# Patient Record
Sex: Male | Born: 1950 | Race: White | Hispanic: No | Marital: Married | Smoking: Former smoker
Health system: Southern US, Community
[De-identification: ages and names within clinical notes are randomized; demographics above are authoritative.]

## PROBLEM LIST (undated history)

## (undated) DIAGNOSIS — I77810 Thoracic aortic ectasia: Secondary | ICD-10-CM

## (undated) DIAGNOSIS — K649 Unspecified hemorrhoids: Secondary | ICD-10-CM

## (undated) DIAGNOSIS — I10 Essential (primary) hypertension: Secondary | ICD-10-CM

## (undated) DIAGNOSIS — K219 Gastro-esophageal reflux disease without esophagitis: Secondary | ICD-10-CM

## (undated) DIAGNOSIS — G4730 Sleep apnea, unspecified: Secondary | ICD-10-CM

## (undated) DIAGNOSIS — N183 Chronic kidney disease, stage 3 unspecified: Secondary | ICD-10-CM

## (undated) DIAGNOSIS — M1990 Unspecified osteoarthritis, unspecified site: Secondary | ICD-10-CM

## (undated) DIAGNOSIS — I499 Cardiac arrhythmia, unspecified: Secondary | ICD-10-CM

## (undated) DIAGNOSIS — E119 Type 2 diabetes mellitus without complications: Secondary | ICD-10-CM

## (undated) DIAGNOSIS — R001 Bradycardia, unspecified: Secondary | ICD-10-CM

## (undated) DIAGNOSIS — N529 Male erectile dysfunction, unspecified: Secondary | ICD-10-CM

## (undated) DIAGNOSIS — R519 Headache, unspecified: Secondary | ICD-10-CM

## (undated) DIAGNOSIS — I5189 Other ill-defined heart diseases: Secondary | ICD-10-CM

## (undated) DIAGNOSIS — N189 Chronic kidney disease, unspecified: Secondary | ICD-10-CM

## (undated) DIAGNOSIS — Z972 Presence of dental prosthetic device (complete) (partial): Secondary | ICD-10-CM

## (undated) DIAGNOSIS — I639 Cerebral infarction, unspecified: Secondary | ICD-10-CM

## (undated) DIAGNOSIS — K5790 Diverticulosis of intestine, part unspecified, without perforation or abscess without bleeding: Secondary | ICD-10-CM

## (undated) DIAGNOSIS — Z7901 Long term (current) use of anticoagulants: Secondary | ICD-10-CM

## (undated) DIAGNOSIS — E785 Hyperlipidemia, unspecified: Secondary | ICD-10-CM

## (undated) DIAGNOSIS — I4891 Unspecified atrial fibrillation: Secondary | ICD-10-CM

---

## 2014-10-18 ENCOUNTER — Encounter: Payer: Self-pay | Admitting: *Deleted

## 2014-10-21 ENCOUNTER — Encounter
Admission: RE | Disposition: A | Discharge status: Home / Self Care | Payer: Self-pay | Source: Ambulatory Visit | Attending: Gastroenterology

## 2014-10-21 ENCOUNTER — Ambulatory Visit
Admission: RE | Admit: 2014-10-21 | Discharge: 2014-10-21 | Disposition: A | Discharge status: Home / Self Care | Payer: Medicare Other | Source: Ambulatory Visit | Attending: Gastroenterology | Admitting: Gastroenterology

## 2014-10-21 ENCOUNTER — Encounter: Payer: Self-pay | Admitting: *Deleted

## 2014-10-21 ENCOUNTER — Ambulatory Visit: Payer: Medicare Other | Admitting: Anesthesiology

## 2014-10-21 DIAGNOSIS — E785 Hyperlipidemia, unspecified: Secondary | ICD-10-CM | POA: Insufficient documentation

## 2014-10-21 DIAGNOSIS — I10 Essential (primary) hypertension: Secondary | ICD-10-CM | POA: Insufficient documentation

## 2014-10-21 DIAGNOSIS — Z1211 Encounter for screening for malignant neoplasm of colon: Secondary | ICD-10-CM | POA: Diagnosis not present

## 2014-10-21 DIAGNOSIS — Z87891 Personal history of nicotine dependence: Secondary | ICD-10-CM | POA: Diagnosis not present

## 2014-10-21 DIAGNOSIS — M1990 Unspecified osteoarthritis, unspecified site: Secondary | ICD-10-CM | POA: Diagnosis not present

## 2014-10-21 DIAGNOSIS — E119 Type 2 diabetes mellitus without complications: Secondary | ICD-10-CM | POA: Insufficient documentation

## 2014-10-21 DIAGNOSIS — Z79899 Other long term (current) drug therapy: Secondary | ICD-10-CM | POA: Insufficient documentation

## 2014-10-21 DIAGNOSIS — K5730 Diverticulosis of large intestine without perforation or abscess without bleeding: Secondary | ICD-10-CM | POA: Insufficient documentation

## 2014-10-21 DIAGNOSIS — K648 Other hemorrhoids: Secondary | ICD-10-CM | POA: Diagnosis not present

## 2014-10-21 HISTORY — DX: Unspecified osteoarthritis, unspecified site: M19.90

## 2014-10-21 HISTORY — DX: Essential (primary) hypertension: I10

## 2014-10-21 HISTORY — PX: COLONOSCOPY WITH PROPOFOL: SHX5780

## 2014-10-21 HISTORY — DX: Type 2 diabetes mellitus without complications: E11.9

## 2014-10-21 HISTORY — DX: Hyperlipidemia, unspecified: E78.5

## 2014-10-21 HISTORY — DX: Unspecified hemorrhoids: K64.9

## 2014-10-21 LAB — GLUCOSE, CAPILLARY: Glucose-Capillary: 156 mg/dL — ABNORMAL HIGH (ref 65–99)

## 2014-10-21 SURGERY — COLONOSCOPY WITH PROPOFOL
Anesthesia: General

## 2014-10-21 MED ORDER — PROPOFOL 10 MG/ML IV BOLUS
INTRAVENOUS | Status: DC | PRN
  Administered 2014-10-21: 70 mg via INTRAVENOUS
  Administered 2014-10-21: 30 mg via INTRAVENOUS

## 2014-10-21 MED ORDER — PROPOFOL INFUSION 10 MG/ML OPTIME
INTRAVENOUS | Status: DC | PRN
  Administered 2014-10-21: 120 ug/kg/min via INTRAVENOUS

## 2014-10-21 MED ORDER — MIDAZOLAM HCL 5 MG/5ML IJ SOLN
INTRAMUSCULAR | Status: DC | PRN
  Administered 2014-10-21: 1 mg via INTRAVENOUS

## 2014-10-21 MED ORDER — SODIUM CHLORIDE 0.9 % IV SOLN
INTRAVENOUS | Status: DC
  Administered 2014-10-21: 09:00:00 via INTRAVENOUS

## 2014-10-21 MED ORDER — ONDANSETRON HCL 4 MG/2ML IJ SOLN
INTRAMUSCULAR | Status: DC | PRN
  Administered 2014-10-21: 4 mg via INTRAVENOUS

## 2014-10-21 MED ORDER — FENTANYL CITRATE (PF) 100 MCG/2ML IJ SOLN
INTRAMUSCULAR | Status: DC | PRN
  Administered 2014-10-21: 50 ug via INTRAVENOUS

## 2014-10-21 MED ORDER — LIDOCAINE HCL (CARDIAC) 20 MG/ML IV SOLN
INTRAVENOUS | Status: DC | PRN
  Administered 2014-10-21: 50 mg via INTRAVENOUS

## 2014-10-21 NOTE — Anesthesia Preprocedure Evaluation (Signed)
Anesthesia Evaluation  Patient identified by MRN, date of birth, ID band Patient awake    Reviewed: Allergy & Precautions, NPO status , Patient's Chart, lab work & pertinent test results, reviewed documented beta blocker date and time   Airway Mallampati: III  TM Distance: >3 FB Neck ROM: Full    Dental  (+) [place] lower left:   Pulmonary former smoker,  breath sounds clear to auscultation  Pulmonary exam normal       Cardiovascular hypertension, Normal cardiovascular exam    Neuro/Psych negative neurological ROS  negative psych ROS   GI/Hepatic negative GI ROS, Neg liver ROS,   Endo/Other  diabetes, Well Controlled, Type 2, Oral Hypoglycemic Agents  Renal/GU negative Renal ROS  negative genitourinary   Musculoskeletal  (+) Arthritis -, Osteoarthritis,    Abdominal Normal abdominal exam  (+)   Peds negative pediatric ROS (+)  Hematology negative hematology ROS (+)   Anesthesia Other Findings   Reproductive/Obstetrics                             Anesthesia Physical Anesthesia Plan  ASA: II  Anesthesia Plan: General   Post-op Pain Management:    Induction: Intravenous  Airway Management Planned: Nasal Cannula  Additional Equipment:   Intra-op Plan:   Post-operative Plan:   Informed Consent: I have reviewed the patients History and Physical, chart, labs and discussed the procedure including the risks, benefits and alternatives for the proposed anesthesia with the patient or authorized representative who has indicated his/her understanding and acceptance.   Dental advisory given  Plan Discussed with: CRNA and Surgeon  Anesthesia Plan Comments:         Anesthesia Quick Evaluation

## 2014-10-21 NOTE — Anesthesia Postprocedure Evaluation (Signed)
  Anesthesia Post-op Note  Patient: George Schmidt  Procedure(s) Performed: Procedure(s): COLONOSCOPY WITH PROPOFOL (N/A)  Anesthesia type:General  Patient location: PACU  Post pain: Pain level controlled  Post assessment: Post-op Vital signs reviewed, Patient's Cardiovascular Status Stable, Respiratory Function Stable, Patent Airway and No signs of Nausea or vomiting  Post vital signs: Reviewed and stable  Last Vitals:  Filed Vitals:   10/21/14 1030  BP:   Pulse: 53  Temp:   Resp: 14    Level of consciousness: awake, alert  and patient cooperative  Complications: No apparent anesthesia complications

## 2014-10-21 NOTE — H&P (Signed)
  Primary Care Physician:  [name], MD  Pre-Procedure History & Physical: HPI:  George Schmidt is a 64 y.o. male is here for an colonoscopy.   Past Medical History  Diagnosis Date  . Hypertension   . Diabetes mellitus without complication   . Arthritis   . Hemorrhoids   . Hyperlipidemia     History reviewed. No pertinent past surgical history.  Prior to Admission medications   Medication Sig Start Date End Date Taking? Authorizing Provider  amLODipine (NORVASC) 10 MG tablet Take 10 mg by mouth daily.   Yes Historical Provider, MD  atenolol-chlorthalidone (TENORETIC) 50-25 MG per tablet Take 1 tablet by mouth daily.   Yes Historical Provider, MD  fenofibrate 160 MG tablet Take 160 mg by mouth daily.   Yes Historical Provider, MD  metFORMIN (GLUCOPHAGE) 500 MG tablet Take by mouth 2 (two) times daily with a meal.   Yes Historical Provider, MD  potassium chloride SA (K-DUR,KLOR-CON) 20 MEQ tablet Take 20 mEq by mouth 2 (two) times daily.   Yes Historical Provider, MD  pravastatin (PRAVACHOL) 20 MG tablet Take 20 mg by mouth daily.   Yes Historical Provider, MD  tadalafil (CIALIS) 10 MG tablet Take 10 mg by mouth daily as needed for erectile dysfunction.   Yes Historical Provider, MD    Allergies as of 09/20/2014  . (Not on File)    History reviewed. No pertinent family history.  History   Social History  . Marital Status: Unknown    Spouse Name: N/A  . Number of Children: N/A  . Years of Education: N/A   Occupational History  . Not on file.   Social History Main Topics  . Smoking status: Former Research scientist (life sciences)  . Smokeless tobacco: Never Used  . Alcohol Use: 0.6 oz/week    1 Cans of beer per week  . Drug Use: No  . Sexual Activity: Not on file   Other Topics Concern  . Not on file   Social History Narrative     Physical Exam: BP 148/81 mmHg  Pulse 64  Temp(Src) 97 F (36.1 C) (Tympanic)  Resp 16  Ht 5\' 9"  (1.753 m)  Wt 265 lb (120.203 kg)  BMI 39.12  kg/m2  SpO2 98% General:   Alert,  pleasant and cooperative in NAD Head:  Normocephalic and atraumatic. Neck:  Supple; no masses or thyromegaly. Lungs:  Clear throughout to auscultation.    Heart:  Regular rate and rhythm. Abdomen:  Soft, nontender and nondistended. Normal bowel sounds, without guarding, and without rebound.   Neurologic:  Alert and  oriented x4;  grossly normal neurologically.  Impression/Plan: Kahil Agner is here for an colonoscopy to be performed for screening, average risk  Risks, benefits, limitations, and alternatives regarding  colonoscopy have been reviewed with the patient.  Questions have been answered.  All parties agreeable.   Josefine Class, MD  10/21/2014, 9:09 AM

## 2014-10-21 NOTE — Transfer of Care (Signed)
Immediate Anesthesia Transfer of Care Note  Patient: George Schmidt  Procedure(s) Performed: Procedure(s): COLONOSCOPY WITH PROPOFOL (N/A)  Patient Location: Endoscopy Unit  Anesthesia Type:General  Level of Consciousness: awake  Airway & Oxygen Therapy: Patient Spontanous Breathing  Post-op Assessment: Report given to RN  Post vital signs: Reviewed  Last Vitals:  Filed Vitals:   10/21/14 0941  BP: 114/74  Pulse: 65  Temp: 35.9 C  Resp: 14    Complications: No apparent anesthesia complications

## 2014-10-21 NOTE — Discharge Instructions (Signed)

## 2014-10-21 NOTE — Op Note (Signed)
[facility] Gastroenterology Patient Name: George Schmidt Procedure Date: 10/21/2014 9:05 AM MRN: [id number] Account #: [id number] Date of Birth: 18-Jan-1951 Admit Type: Outpatient Age: 64 Room: [facility] ENDO ROOM 2 Gender: Male Note Status: Finalized Procedure:         Colonoscopy Indications:       Screening for colorectal malignant neoplasm, This is the                     patient's first colonoscopy Patient Profile:   This is a 64 year old male. Providers:         Gerrit Heck. Rayann Heman, MD Referring MD:      Irven Easterly. Kary Kos, MD (Referring MD) Medicines:         Propofol per Anesthesia Complications:     No immediate complications. Procedure:         Pre-Anesthesia Assessment:                    - Prior to the procedure, a History and Physical was                     performed, and patient medications and allergies were                     reviewed. The patient is competent. The risks and benefits                     of the procedure and the sedation options and risks were                     discussed with the patient. All questions were answered                     and informed consent was obtained. Patient identification                     and proposed procedure were verified by the physician and                     the nurse in the pre-procedure area. Mental Status                     Examination: alert and oriented. Airway Examination:                     normal oropharyngeal airway and neck mobility. Respiratory                     Examination: clear to auscultation. CV Examination: RRR,                     no murmurs, no S3 or S4. Prophylactic Antibiotics: The                     patient does not require prophylactic antibiotics. Prior                     Anticoagulants: The patient has taken aspirin, last dose                     was 1 day prior to procedure. ASA Grade Assessment: II - A                     patient with mild  systemic disease. After reviewing  the                     risks and benefits, the patient was deemed in satisfactory                     condition to undergo the procedure. The anesthesia plan                     was to use monitored anesthesia care (MAC). Immediately                     prior to administration of medications, the patient was                     re-assessed for adequacy to receive sedatives. The heart                     rate, respiratory rate, oxygen saturations, blood                     pressure, adequacy of pulmonary ventilation, and response                     to care were monitored throughout the procedure. The                     physical status of the patient was re-assessed after the                     procedure.                    - Prior to the procedure, a History and Physical was                     performed, and patient medications, allergies and                     sensitivities were reviewed. The patient's tolerance of                     previous anesthesia was reviewed.                    After obtaining informed consent, the colonoscope was                     passed under direct vision. Throughout the procedure, the                     patient's blood pressure, pulse, and oxygen saturations                     were monitored continuously. The Colonoscope was                     introduced through the anus and advanced to the the cecum,                     identified by appendiceal orifice and ileocecal valve. The                     colonoscopy was performed without difficulty. The patient                     tolerated the procedure well.  The quality of the bowel                     preparation was good. Findings:      The perianal and digital rectal examinations were normal.      A few small and large-mouthed diverticula were found in the sigmoid       colon.      Internal hemorrhoids were found during retroflexion. The hemorrhoids       were Grade I (internal hemorrhoids that do  not prolapse).      The exam was otherwise without abnormality. Impression:        - Diverticulosis in the sigmoid colon.                    - Internal hemorrhoids.                    - The examination was otherwise normal.                    - No specimens collected. Recommendation:    - Observe patient in GI recovery unit.                    - High fiber diet.                    - Continue present medications.                    - Repeat colonoscopy in 10 years for screening purposes.                    - Return to referring physician.                    - The findings and recommendations were discussed with the                     patient.                    - The findings and recommendations were discussed with the                     patient's family. Procedure Code(s): --- Professional ---                    [phone], Colonoscopy, flexible; diagnostic, including                     collection of specimen(s) by brushing or washing, when                     performed (separate procedure) CPT copyright 2014 American Medical Association. All rights reserved. The codes documented in this report are preliminary and upon coder review may  be revised to meet current compliance requirements. Mellody Life, MD 10/21/2014 9:39:33 AM This report has been signed electronically. Number of Addenda: 0 Note Initiated On: 10/21/2014 9:05 AM Scope Withdrawal Time: 0 hours 9 minutes 55 seconds  Total Procedure Duration: 0 hours 18 minutes 3 seconds       [facility]

## 2014-10-26 ENCOUNTER — Encounter: Payer: Self-pay | Admitting: Gastroenterology

## 2019-08-25 ENCOUNTER — Ambulatory Visit: Payer: Medicare Other | Admitting: Dermatology

## 2019-08-25 ENCOUNTER — Other Ambulatory Visit: Payer: Self-pay

## 2019-08-25 DIAGNOSIS — L570 Actinic keratosis: Secondary | ICD-10-CM

## 2019-08-25 DIAGNOSIS — D485 Neoplasm of uncertain behavior of skin: Secondary | ICD-10-CM | POA: Diagnosis not present

## 2019-08-25 DIAGNOSIS — L219 Seborrheic dermatitis, unspecified: Secondary | ICD-10-CM | POA: Diagnosis not present

## 2019-08-25 DIAGNOSIS — C44619 Basal cell carcinoma of skin of left upper limb, including shoulder: Secondary | ICD-10-CM

## 2019-08-25 DIAGNOSIS — L578 Other skin changes due to chronic exposure to nonionizing radiation: Secondary | ICD-10-CM

## 2019-08-25 DIAGNOSIS — C4491 Basal cell carcinoma of skin, unspecified: Secondary | ICD-10-CM

## 2019-08-25 HISTORY — DX: Basal cell carcinoma of skin, unspecified: C44.91

## 2019-08-25 NOTE — Progress Notes (Signed)
Follow-Up Visit   Subjective  George Schmidt is a 69 y.o. male who presents for the following: Follow-up (AK x 7 treated with LN2 3 months ago. ).  Patient noticed a new spot on each forearm. He is not sure how long they've been there. No symptoms with either one and he has not done any treatment for them.   The following portions of the chart were reviewed this encounter and updated as appropriate: Tobacco  Allergies  Meds  Problems  Med Hx  Surg Hx  Fam Hx      Review of Systems: No other skin or systemic complaints.  Objective  Well appearing patient in no apparent distress; mood and affect are within normal limits.  A focused examination was performed including face, ears, arms, shoulders, scalp. Relevant physical exam findings are noted in the Assessment and Plan.  Objective  Right Forearm x 1, R nasal sidewall x 1, R temple x 6, R frontal hairline x 1 (9): Erythematous thin papules/macules with gritty scale.   Objective  Right Forearm: 0.6cm scaly pink think papule       Objective  Left anterior shoulder medial: 2.2 x 1.3cm thin pink plaque        Objective  Left anterior shoulder lateral: 1.2 x 1.0cm thin pink plaque       Objective  Scalp: Pink patches with greasy scale.   Assessment & Plan  AK (actinic keratosis) (9) Right Forearm x 1, R nasal sidewall x 1, R temple x 6, R frontal hairline x 1  Destruction of lesion - Right Forearm x 1, R nasal sidewall x 1, R temple x 6, R frontal hairline x 1 Complexity: simple   Destruction method: cryotherapy   Informed consent: discussed and consent obtained   Lesion destroyed using liquid nitrogen: Yes   Region frozen until ice ball extended beyond lesion: Yes   Outcome: patient tolerated procedure well with no complications   Post-procedure details: wound care instructions given    Neoplasm of uncertain behavior of skin (3) Right Forearm  Skin / nail biopsy Type of biopsy: tangential    Informed consent: discussed and consent obtained   Timeout: patient name, date of birth, surgical site, and procedure verified   Patient was prepped and draped in usual sterile fashion: Area prepped with isopropyl alcohol. Anesthesia: the lesion was anesthetized in a standard fashion   Anesthetic:  1% lidocaine w/ epinephrine 1-100,000 buffered w/ 8.4% NaHCO3 Instrument used: flexible razor blade   Hemostasis achieved with: aluminum chloride   Outcome: patient tolerated procedure well   Post-procedure details: wound care instructions given   Additional details:  Mupirocin and a bandage applied  Specimen 1 - Surgical pathology Differential Diagnosis: r/o SCC vs SK vs Prurigo Nodule Check Margins: No 0.6cm scaly pink think papule  Left anterior shoulder medial  Skin / nail biopsy Type of biopsy: tangential   Informed consent: discussed and consent obtained   Timeout: patient name, date of birth, surgical site, and procedure verified   Patient was prepped and draped in usual sterile fashion: Area prepped with isopropyl alcohol. Anesthesia: the lesion was anesthetized in a standard fashion   Anesthetic:  1% lidocaine w/ epinephrine 1-100,000 buffered w/ 8.4% NaHCO3 Instrument used: flexible razor blade   Hemostasis achieved with: aluminum chloride   Outcome: patient tolerated procedure well   Post-procedure details: wound care instructions given   Additional details:  Mupirocin and a bandage applied  Specimen 2 - Surgical pathology Differential Diagnosis:  R/o BCC vs Scar vs Other Check Margins: No 2.2 x 1.3cm thin pink plaque  Left anterior shoulder lateral  Skin / nail biopsy Type of biopsy: tangential   Informed consent: discussed and consent obtained   Timeout: patient name, date of birth, surgical site, and procedure verified   Patient was prepped and draped in usual sterile fashion: Area prepped with isopropyl alcohol. Anesthesia: the lesion was anesthetized in a standard  fashion   Anesthetic:  1% lidocaine w/ epinephrine 1-100,000 buffered w/ 8.4% NaHCO3 Instrument used: flexible razor blade   Hemostasis achieved with: aluminum chloride   Outcome: patient tolerated procedure well   Post-procedure details: wound care instructions given   Additional details:  Mupirocin and a bandage applied  Specimen 3 - Surgical pathology Differential Diagnosis: R/o BCC vs Scar vs Other Check Margins: No 1.2 x 1.0cm thin pink plaque  Seborrheic dermatitis Scalp  Pt defers treatment today. Using Head & Shoulders. Advised him he can continue but to leave on for 10 minutes before washing out.   Actinic Damage - diffuse scaly erythematous macules with underlying dyspigmentation - Recommend daily broad spectrum sunscreen SPF 30+ to sun-exposed areas, reapply every 2 hours as needed.  - Call for new or changing lesions.  Return in about 3 months (around 11/24/2019) for [place], RMA, am acting as scribe for Forest Gleason, MD .  Documentation: I have reviewed the above documentation for accuracy and completeness, and I agree with the above.  Forest Gleason, MD

## 2019-08-25 NOTE — Patient Instructions (Signed)
Wound Care Instructions  1. Cleanse wound gently with soap and water once a day then pat dry with clean gauze. Apply a thing coat of Petrolatum (petroleum jelly, "Vaseline") over the wound (unless you have an allergy to this). We recommend that you use a new, sterile tube of Vaseline. Do not pick or remove scabs. Do not remove the yellow or white "healing tissue" from the base of the wound.  2. Cover the wound with fresh, clean, nonstick gauze and secure with paper tape. You may use Band-Aids in place of gauze and tape if the would is small enough, but would recommend trimming much of the tape off as there is often too much. Sometimes Band-Aids can irritate the skin.  3. You should call the office for your biopsy report after 1 week if you have not already been contacted.  4. If you experience any problems, such as abnormal amounts of bleeding, swelling, significant bruising, significant pain, or evidence of infection, please call the office immediately.  5. FOR ADULT SURGERY PATIENTS: If you need something for pain relief you may take 1 extra strength Tylenol (acetaminophen) AND 2 Ibuprofen (200mg each) together every 4 hours as needed for pain. (do not take these if you are allergic to them or if you have a reason you should not take them.) Typically, you may only need pain medication for 1 to 3 days.    Cryotherapy Aftercare  . Wash gently with soap and water everyday.   . Apply Vaseline and Band-Aid daily until healed.  Recommend daily broad spectrum sunscreen SPF 30+ to sun-exposed areas, reapply every 2 hours as needed. Call for new or changing lesions.  

## 2019-09-01 ENCOUNTER — Telehealth: Payer: Self-pay | Admitting: Dermatology

## 2019-09-01 NOTE — Telephone Encounter (Signed)
Spoke with patient regarding results. He is in agreement with treatment plans.  All questions answered.  Fredric Dine or Abby, can you please schedule Mr. George Schmidt for an [facility] and LN2 appointment (15 minute appointment) at the next available with me or one of the other doctors? Thank you!  1. 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 It is not dangerous but is contagious and can spread to other areas of skin or other people if it is not completely gone. No additional treatment is needed. However, if it comes back, we can freeze it in clinic with liquid nitrogen (a quick in office procedure) or you can also treat it at home with an over the counter salicylic wart treatment (slower).  2. Skin, left anterior shoulder medial ACTINIC KERATOSIS AND SEBORRHEIC KERATOSIS --> LN2 in clinic  3. Skin, left anterior shoulder lateral BASAL CELL CARCINOMA, SUPERFICIAL AND NODULAR PATTERNS  --> ED&C  Recommend Nicotinamide 500mg  twice per day to lower risk of non-melanoma skin cancer by approximately 25%.

## 2019-09-01 NOTE — Progress Notes (Signed)
1. 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 It is not dangerous but is contagious and can spread to other areas of skin or other people if it is not completely gone. No additional treatment is needed. However, if it comes back, we can freeze it in clinic with liquid nitrogen (a quick in office procedure) or you can also treat it at home with an over the counter salicylic wart treatment (slower).  2. Skin, left anterior shoulder medial ACTINIC KERATOSIS AND SEBORRHEIC KERATOSIS --> LN2 in clinic  3. Skin, left anterior shoulder lateral BASAL CELL CARCINOMA, SUPERFICIAL AND NODULAR PATTERNS  --> ED&C  Recommend Nicotinamide 500mg  twice per day to lower risk of non-melanoma skin cancer by approximately 25%.

## 2019-09-09 ENCOUNTER — Encounter: Payer: Self-pay | Admitting: Dermatology

## 2019-09-16 NOTE — Telephone Encounter (Signed)
Patient is scheduled   

## 2019-09-24 ENCOUNTER — Ambulatory Visit: Payer: Medicare Other | Admitting: Dermatology

## 2019-10-13 ENCOUNTER — Ambulatory Visit: Payer: Medicare Other | Admitting: Dermatology

## 2019-11-10 ENCOUNTER — Other Ambulatory Visit: Payer: Self-pay

## 2019-11-10 ENCOUNTER — Ambulatory Visit: Payer: Medicare Other | Admitting: Dermatology

## 2019-11-10 DIAGNOSIS — C44619 Basal cell carcinoma of skin of left upper limb, including shoulder: Secondary | ICD-10-CM

## 2019-11-10 DIAGNOSIS — L570 Actinic keratosis: Secondary | ICD-10-CM | POA: Diagnosis not present

## 2019-11-10 DIAGNOSIS — L578 Other skin changes due to chronic exposure to nonionizing radiation: Secondary | ICD-10-CM

## 2019-11-10 DIAGNOSIS — B079 Viral wart, unspecified: Secondary | ICD-10-CM

## 2019-11-10 NOTE — Progress Notes (Signed)
Follow-Up Visit   Subjective  George Schmidt is a 69 y.o. male who presents for the following: Procedure.  Patient presents today for ED& C for [place] on Left anterior lateral shoulder, and cryotherapy for AK on left anterior shoulder and for wart on R forearm  The following portions of the chart were reviewed this encounter and updated as appropriate:      Review of Systems:  No other skin or systemic complaints except as noted in HPI or Assessment and Plan.  Objective  Well appearing patient in no apparent distress; mood and affect are within normal limits.  A focused examination was performed including Left Shoulder. Relevant physical exam findings are noted in the Assessment and Plan.  Objective  Left Anterior Lateral Shoulder: Pink pearly plaque    Objective  Left Anterior Medial Shoulder: Erythematous patch   Objective  Right Forearm - Anterior: Verrucous papule -- Discussed viral etiology and contagion.    Assessment & Plan  Basal cell carcinoma (BCC) of skin of left upper extremity including shoulder Left Anterior Lateral Shoulder  Destruction of lesion  Destruction method: electrodesiccation and curettage   Informed consent: discussed and consent obtained   Timeout:  patient name, date of birth, surgical site, and procedure verified Patient was prepped and draped in usual sterile fashion: area prepped with Isopropyl Alcohol. Anesthesia: the lesion was anesthetized in a standard fashion   Anesthetic:  1% lidocaine w/ epinephrine 1-100,000 local infiltration Curettage performed in three different directions: Yes   Electrodesiccation performed over the curetted area: Yes   Lesion length (cm):  1.2 Lesion width (cm):  1 Margin per side (cm):  0.3 Final wound size (cm):  1.8 Hemostasis achieved with:  pressure, aluminum chloride and electrodesiccation Outcome: patient tolerated procedure well with no complications   Post-procedure details: wound care  instructions given   Post-procedure details comment:  Ointment and bandage applied  AK (actinic keratosis) Left Anterior Medial Shoulder  Biopsy proven AK/ISK Cryotherapy today Prior to procedure, discussed risks of blister formation, small wound, skin dyspigmentation, or rare scar following cryotherapy.     Destruction of lesion - Left Anterior Medial Shoulder  Destruction method: cryotherapy   Informed consent: discussed and consent obtained   Lesion destroyed using liquid nitrogen: Yes   Region frozen until ice ball extended beyond lesion: Yes   Outcome: patient tolerated procedure well with no complications   Post-procedure details: wound care instructions given    Viral warts, unspecified type Right Forearm - Anterior  Biopsy-proven wart.  Discussed viral etiology and risk of spread.  Discussed multiple treatments may be required to clear warts.  Discussed possible post-treatment dyspigmentation and risk of recurrence.  Cryotherapy today   Destruction of lesion - Right Forearm - Anterior  Destruction method: cryotherapy   Informed consent: discussed and consent obtained   Lesion destroyed using liquid nitrogen: Yes   Region frozen until ice ball extended beyond lesion: Yes   Outcome: patient tolerated procedure well with no complications   Post-procedure details: wound care instructions given   Additional details:  Prior to procedure, discussed risks of blister formation, small wound, skin dyspigmentation, or rare scar following cryotherapy.    Actinic Damage - diffuse scaly erythematous macules with underlying dyspigmentation - Recommend daily broad spectrum sunscreen SPF 30+ to sun-exposed areas, reapply every 2 hours as needed.  - Call for new or changing lesions.   Return in 4 months (on 03/11/2020) for UBSE with Dr. Laurence Ferrari, recheck [facility] site, AK, wart.  Marene Lenz, CMA, am acting as scribe for Brendolyn Patty, MD .  Documentation: I have reviewed the above  documentation for accuracy and completeness, and I agree with the above.  Brendolyn Patty MD

## 2019-11-10 NOTE — Patient Instructions (Signed)
Recommend daily broad spectrum sunscreen SPF 30+ to sun-exposed areas, reapply every 2 hours as needed. Call for new or changing lesions.  Electrodesiccation and Curettage ("Scrape and Burn") Wound Care Instructions  1. Leave the original bandage on for 24 hours if possible.  If the bandage becomes soaked or soiled before that time, it is OK to remove it and examine the wound.  A small amount of post-operative bleeding is normal.  If excessive bleeding occurs, remove the bandage, place gauze over the site and apply continuous pressure (no peeking) over the area for 30 minutes. If this does not work, please call our clinic as soon as possible or page your doctor if it is after hours.   2. Once a day, cleanse the wound with soap and water. It is fine to shower. If a thick crust develops you may use a Q-tip dipped into dilute hydrogen peroxide (mix 1:1 with water) to dissolve it.  Hydrogen peroxide can slow the healing process, so use it only as needed.    3. After washing, apply petroleum jelly (Vaseline) or an antibiotic ointment if your doctor prescribed one for you, followed by a bandage.    4. For best healing, the wound should be covered with a layer of ointment at all times. If you are not able to keep the area covered with a bandage to hold the ointment in place, this may mean re-applying the ointment several times a day.  Continue this wound care until the wound has healed and is no longer open. It may take several weeks for the wound to heal and close.  Itching and mild discomfort is normal during the healing process.  If you have any discomfort, you can take Tylenol (acetaminophen) or ibuprofen as directed on the bottle. (Please do not take these if you have an allergy to them or cannot take them for another reason).  Some redness, tenderness and white or yellow material in the wound is normal healing.  If the area becomes very sore and red, or develops a thick yellow-green material (pus), it  may be infected; please notify us.    Wound healing continues for up to one year following surgery. It is not unusual to experience pain in the scar from time to time during the interval.  If the pain becomes severe or the scar thickens, you should notify the office.    A slight amount of redness in a scar is expected for the first six months.  After six months, the redness will fade and the scar will soften and fade.  The color difference becomes less noticeable with time.  If there are any problems, return for a post-op surgery check at your earliest convenience.  To improve the appearance of the scar, you can use silicone scar gel, cream, or sheets (such as Mederma or Serica) every night for up to one year. These are available over the counter (without a prescription).  Please call our office at [phone] for any questions or concerns.  

## 2019-11-30 ENCOUNTER — Observation Stay: Payer: Medicare Other

## 2019-11-30 ENCOUNTER — Emergency Department: Payer: Medicare Other

## 2019-11-30 ENCOUNTER — Other Ambulatory Visit: Payer: Self-pay

## 2019-11-30 ENCOUNTER — Observation Stay
Admission: EM | Admit: 2019-11-30 | Discharge: 2019-12-01 | Disposition: A | Discharge status: Home / Self Care | Payer: Medicare Other | Attending: Internal Medicine | Admitting: Internal Medicine

## 2019-11-30 DIAGNOSIS — R270 Ataxia, unspecified: Secondary | ICD-10-CM

## 2019-11-30 DIAGNOSIS — E119 Type 2 diabetes mellitus without complications: Secondary | ICD-10-CM | POA: Diagnosis not present

## 2019-11-30 DIAGNOSIS — Z20822 Contact with and (suspected) exposure to covid-19: Secondary | ICD-10-CM | POA: Insufficient documentation

## 2019-11-30 DIAGNOSIS — I639 Cerebral infarction, unspecified: Secondary | ICD-10-CM | POA: Diagnosis present

## 2019-11-30 DIAGNOSIS — E785 Hyperlipidemia, unspecified: Secondary | ICD-10-CM | POA: Diagnosis not present

## 2019-11-30 DIAGNOSIS — R778 Other specified abnormalities of plasma proteins: Secondary | ICD-10-CM | POA: Insufficient documentation

## 2019-11-30 DIAGNOSIS — R531 Weakness: Secondary | ICD-10-CM | POA: Insufficient documentation

## 2019-11-30 DIAGNOSIS — I63542 Cerebral infarction due to unspecified occlusion or stenosis of left cerebellar artery: Secondary | ICD-10-CM

## 2019-11-30 DIAGNOSIS — R42 Dizziness and giddiness: Secondary | ICD-10-CM | POA: Diagnosis present

## 2019-11-30 DIAGNOSIS — E876 Hypokalemia: Secondary | ICD-10-CM | POA: Diagnosis not present

## 2019-11-30 DIAGNOSIS — I10 Essential (primary) hypertension: Secondary | ICD-10-CM | POA: Diagnosis present

## 2019-11-30 DIAGNOSIS — R7989 Other specified abnormal findings of blood chemistry: Secondary | ICD-10-CM | POA: Diagnosis present

## 2019-11-30 HISTORY — DX: Cerebral infarction due to unspecified occlusion or stenosis of left cerebellar artery: I63.542

## 2019-11-30 LAB — CBC WITH DIFFERENTIAL/PLATELET
Abs Immature Granulocytes: 0.06 10*3/uL (ref 0.00–0.07)
Basophils Absolute: 0 10*3/uL (ref 0.0–0.1)
Basophils Relative: 0 %
Eosinophils Absolute: 0 10*3/uL (ref 0.0–0.5)
Eosinophils Relative: 0 %
HCT: 38.3 % — ABNORMAL LOW (ref 39.0–52.0)
Hemoglobin: 13.3 g/dL (ref 13.0–17.0)
Immature Granulocytes: 1 %
Lymphocytes Relative: 9 %
Lymphs Abs: 0.9 10*3/uL (ref 0.7–4.0)
MCH: 32.4 pg (ref 26.0–34.0)
MCHC: 34.7 g/dL (ref 30.0–36.0)
MCV: 93.2 fL (ref 80.0–100.0)
Monocytes Absolute: 0.5 10*3/uL (ref 0.1–1.0)
Monocytes Relative: 4 %
Neutro Abs: 8.7 10*3/uL — ABNORMAL HIGH (ref 1.7–7.7)
Neutrophils Relative %: 86 %
Platelets: 203 10*3/uL (ref 150–400)
RBC: 4.11 MIL/uL — ABNORMAL LOW (ref 4.22–5.81)
RDW: 12.3 % (ref 11.5–15.5)
WBC: 10.1 10*3/uL (ref 4.0–10.5)
nRBC: 0 % (ref 0.0–0.2)

## 2019-11-30 LAB — COMPREHENSIVE METABOLIC PANEL
ALT: 20 U/L (ref 0–44)
AST: 20 U/L (ref 15–41)
Albumin: 3.4 g/dL — ABNORMAL LOW (ref 3.5–5.0)
Alkaline Phosphatase: 24 U/L — ABNORMAL LOW (ref 38–126)
Anion gap: 12 (ref 5–15)
BUN: 23 mg/dL (ref 8–23)
CO2: 17 mmol/L — ABNORMAL LOW (ref 22–32)
Calcium: 7.4 mg/dL — ABNORMAL LOW (ref 8.9–10.3)
Chloride: 113 mmol/L — ABNORMAL HIGH (ref 98–111)
Creatinine, Ser: 1.04 mg/dL (ref 0.61–1.24)
GFR calc Af Amer: >60 mL/min (ref >60)
GFR calc non Af Amer: >60 mL/min (ref >60)
Glucose, Bld: 161 mg/dL — ABNORMAL HIGH (ref 70–99)
Potassium: 2.5 mmol/L — CL (ref 3.5–5.1)
Sodium: 142 mmol/L (ref 135–145)
Total Bilirubin: 0.9 mg/dL (ref 0.3–1.2)
Total Protein: 6 g/dL — ABNORMAL LOW (ref 6.5–8.1)

## 2019-11-30 LAB — GLUCOSE, CAPILLARY
Glucose-Capillary: 144 mg/dL — ABNORMAL HIGH (ref 70–99)
Glucose-Capillary: 155 mg/dL — ABNORMAL HIGH (ref 70–99)

## 2019-11-30 LAB — TROPONIN I (HIGH SENSITIVITY)
Troponin I (High Sensitivity): 103 ng/L (ref <18)
Troponin I (High Sensitivity): 85 ng/L — ABNORMAL HIGH (ref <18)
Troponin I (High Sensitivity): 96 ng/L — ABNORMAL HIGH (ref <18)
Troponin I (High Sensitivity): 108 ng/L (ref <18)
Troponin I (High Sensitivity): 112 ng/L (ref <18)

## 2019-11-30 LAB — POTASSIUM: Potassium: 3.7 mmol/L (ref 3.5–5.1)

## 2019-11-30 LAB — SARS CORONAVIRUS 2 BY RT PCR (HOSPITAL ORDER, PERFORMED IN ~~LOC~~ HOSPITAL LAB): SARS Coronavirus 2: NEGATIVE

## 2019-11-30 IMAGING — CT CT ANGIO HEAD
2 of 7 series · 8 of 33 positions shown · IV contrast (APPLIED)
Comparison: None.

CLINICAL DATA: Cerebellar infarcts

EXAM:
CT ANGIOGRAPHY HEAD AND NECK
TECHNIQUE: Multidetector CT imaging of the head and neck was performed using
the standard protocol during bolus administration of intravenous
contrast. Multiplanar CT image reconstructions and MIPs were
obtained to evaluate the vascular anatomy. Carotid stenosis
measurements (when applicable) are obtained utilizing NASCET
criteria, using the distal internal carotid diameter as the
denominator.
CONTRAST:  75mL OMNIPAQUE IOHEXOL 350 MG/ML SOLN

[Series 4: cta head neck · axial · 0.59mm/px · z∈[-271,-153]mm · 2 of 179 slices shown]
[im 60/179  soft-tissue]
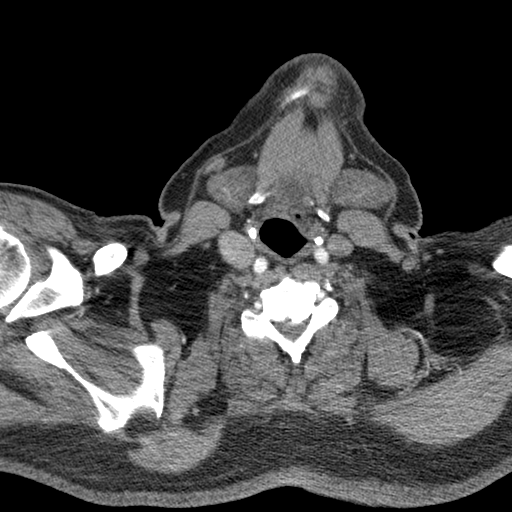
[im 119/179  soft-tissue]
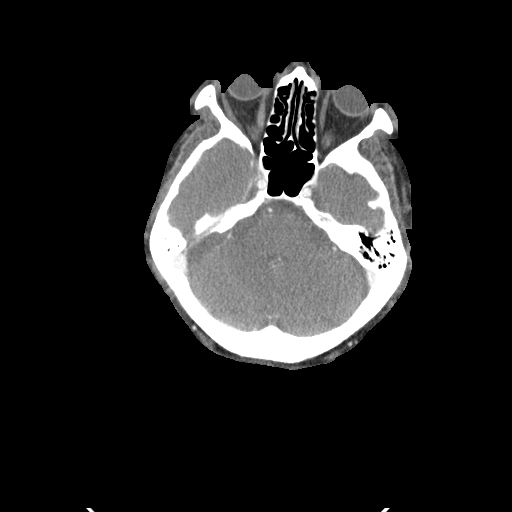

[Series 6: ax thin · axial · 0.50mm/px · z∈[-339,-84]mm · 6 of 357 slices shown]
[im 51/357  soft-tissue]
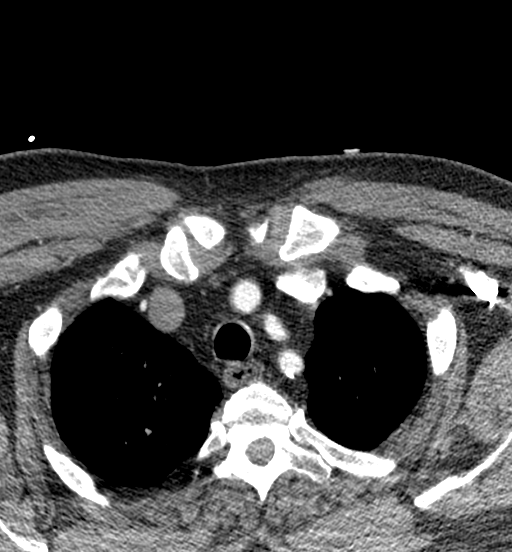
[im 102/357  bone]
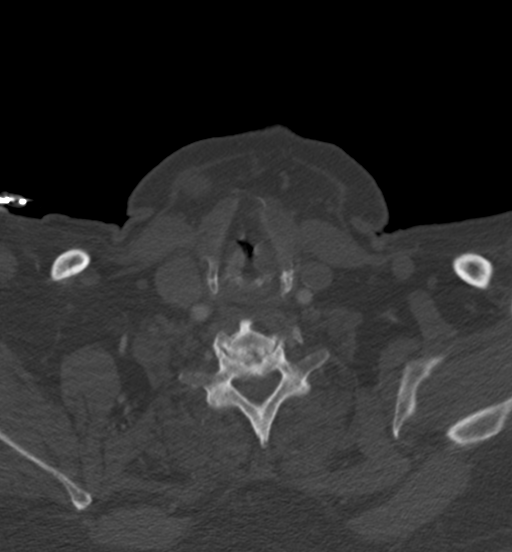
[im 153/357  soft-tissue]
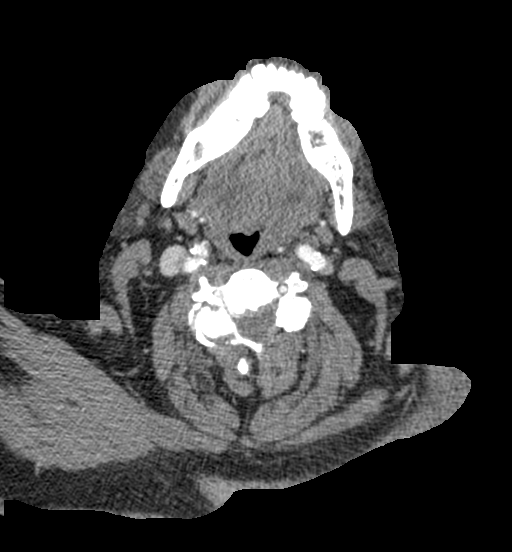
[im 204/357  bone]
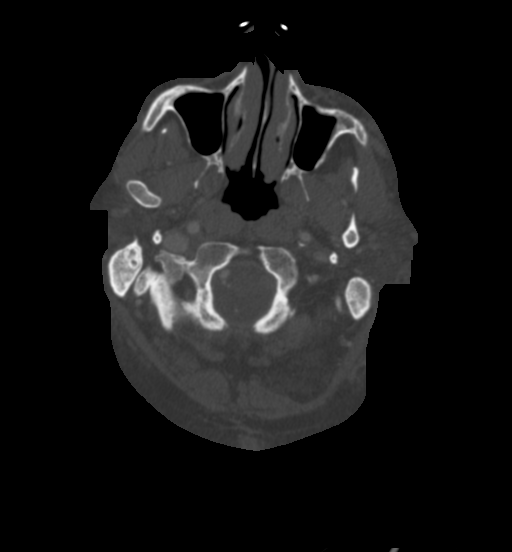
[im 255/357  soft-tissue]
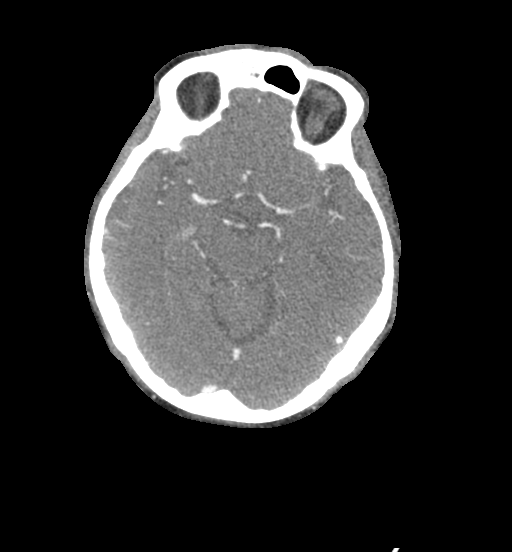
[im 306/357  bone]
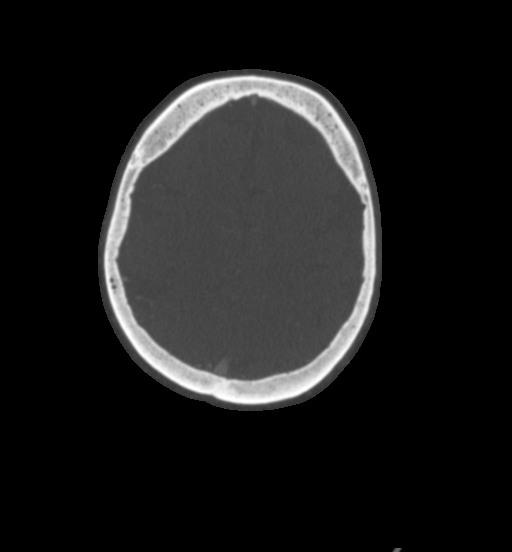

[8 of 33 positions shown; findings below may reference images not displayed]

FINDINGS: CTA NECK

Aortic arch: Great vessel origins are patent.

Right carotid system: Patent. Mild calcified plaque along the common
carotid. Mild calcified plaque at the ICA origin without measurable
stenosis.

Left carotid system: Patent. Mild calcified and noncalcified plaque
at the ICA origin causing minimal stenosis. Mild plaque along the
distal cervical ICA.

Vertebral arteries: Right vertebral artery is patent. Proximal left
vertebral arteries patent with calcified plaque at the origin likely
causing mild stenosis. There is loss of enhancement at the level of
the C2 transverse foramen without reconstitution in the neck.

Skeleton: Multilevel degenerative changes of the cervical spine.

Other neck: No mass or adenopathy.

Upper chest: No apical lung mass.

Review of the MIP images confirms the above findings

CTA HEAD

Anterior circulation: Intracranial internal carotid arteries are
patent with calcified plaque causing mild to moderate stenosis.
Anterior cerebral arteries are patent. Anterior communicating artery
is present. Middle cerebral arteries are patent.

Posterior circulation: Intracranial right vertebral artery and PICA
origins are patent. There is minimal reconstitution of the left
vertebral artery distally adjacent to the basilar artery origin.
Left PICA origin appears occluded. Basilar artery is patent with
moderate to marked stenosis with mid segment. Small caliber AICAs
are present. Superior cerebral artery origins are patent. Posterior
cerebral arteries are patent.

Venous sinuses: Patent as allowed by contrast bolus timing.

Review of the MIP images confirms the above findings
IMPRESSION: Occlusion of the left extracranial vertebral artery at the C2
transverse foramen with minimal reconstitution adjacent to the
basilar origin. Left PICA origin appears occluded.

Moderate to marked stenosis of the mid basilar artery.

No hemodynamically significant stenosis at the ICA origins.

## 2019-11-30 IMAGING — CT CT HEAD W/O CM
3 series · 15 of 47 positions shown, 18 images · non-contrast
Comparison: No pertinent prior studies available for comparison.

CLINICAL DATA: Ataxia, stroke suspected. Additional provided:
Weakness and dizziness, vomiting

EXAM:
CT HEAD WITHOUT CONTRAST
TECHNIQUE: Contiguous axial images were obtained from the base of the skull
through the vertex without intravenous contrast.

[Series 3: head wo · axial · 0.44mm/px · z∈[-169,-39]mm · 9 of 32 slices shown, 12 images]
[im 3/32  brain]
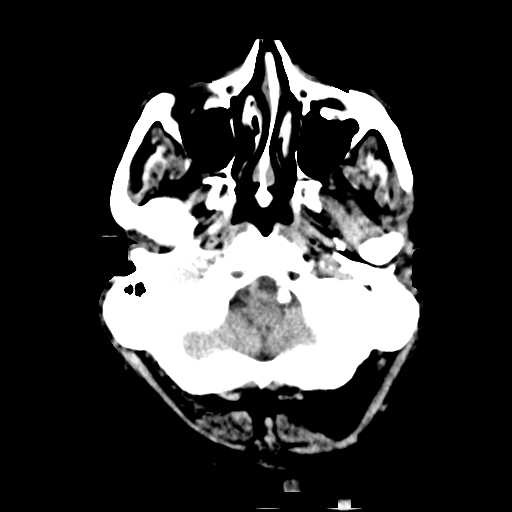
[im 3/32  bone]
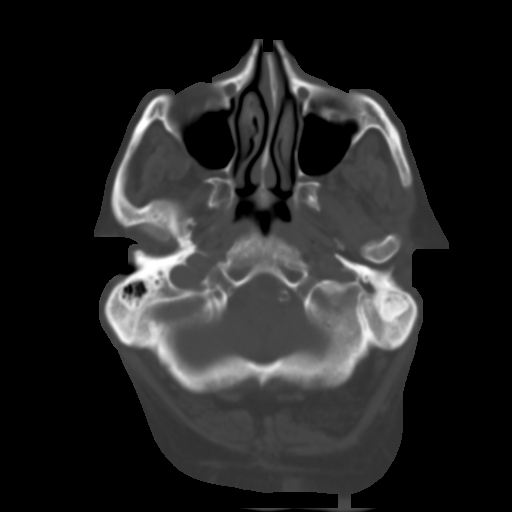
[im 6/32  brain]
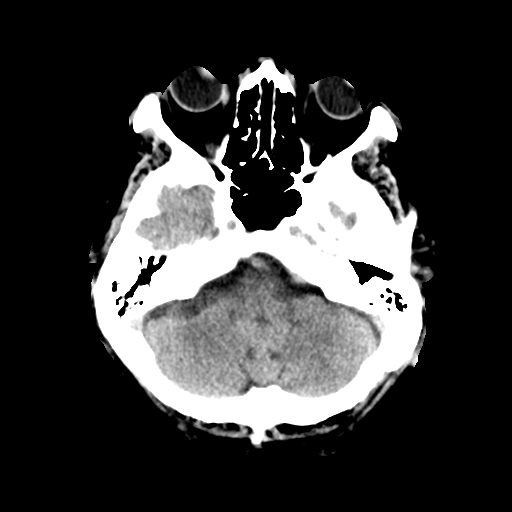
[im 9/32  brain]
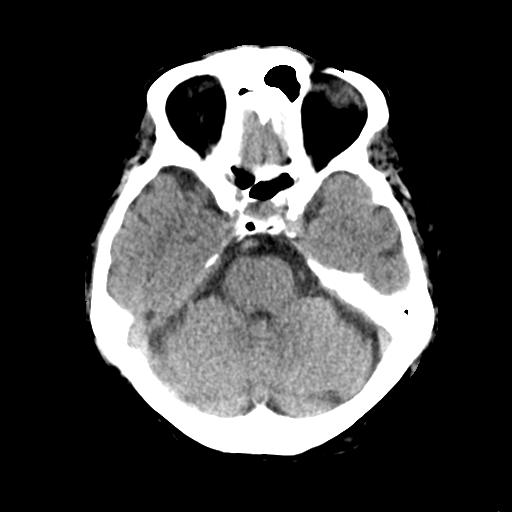
[im 12/32  brain]
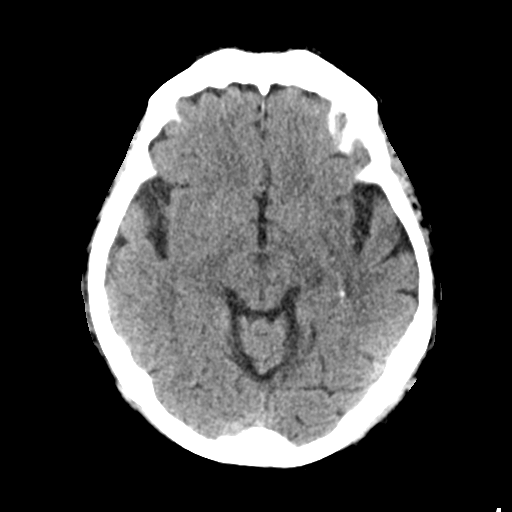
[im 17/32  brain]
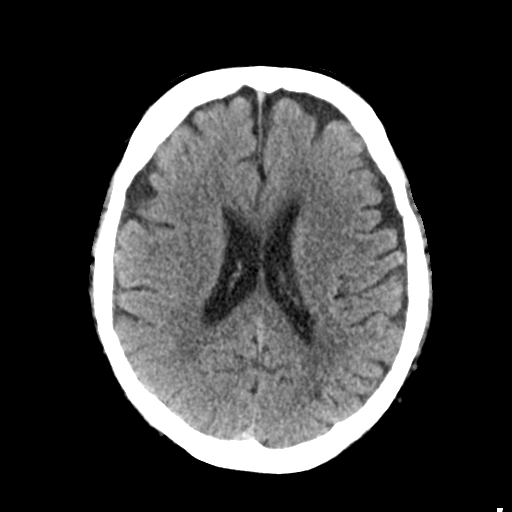
[im 17/32  bone]
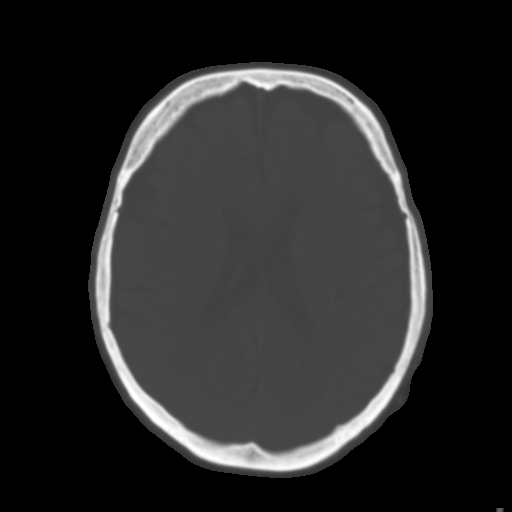
[im 20/32  brain]
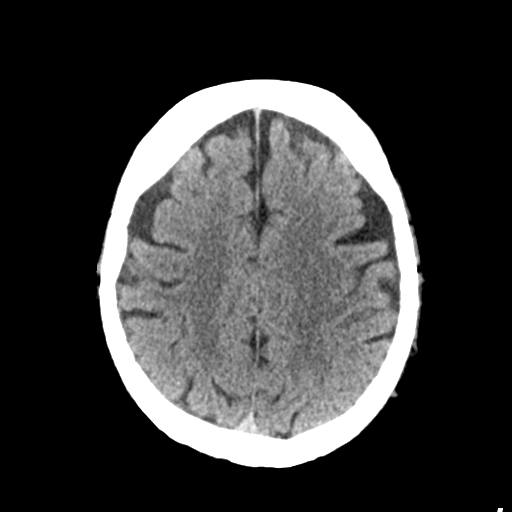
[im 23/32  brain]
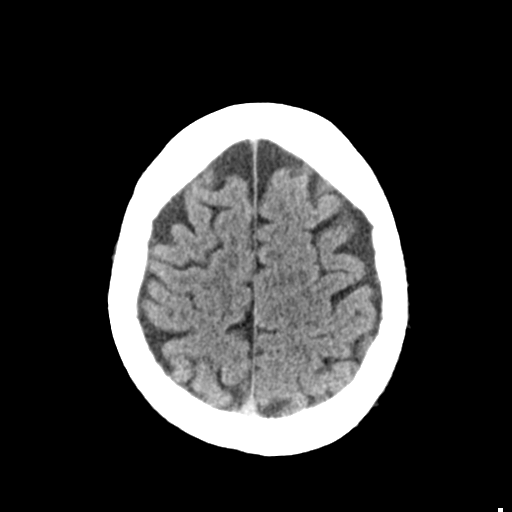
[im 26/32  brain]
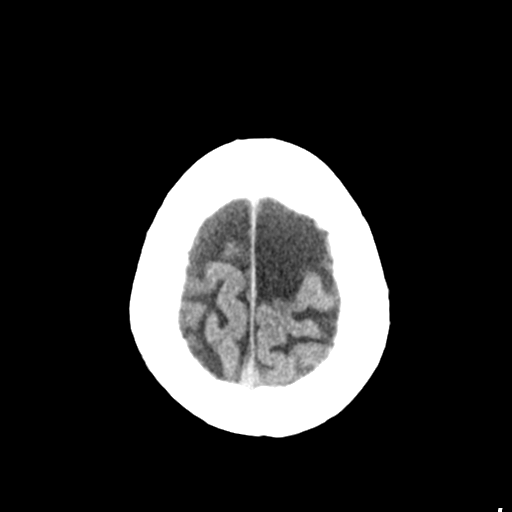
[im 29/32  brain]
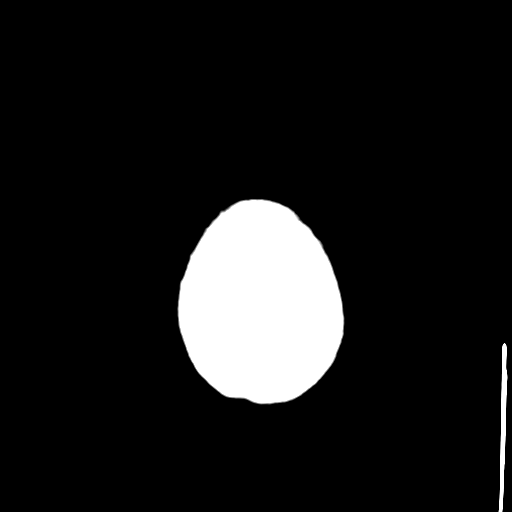
[im 29/32  bone]
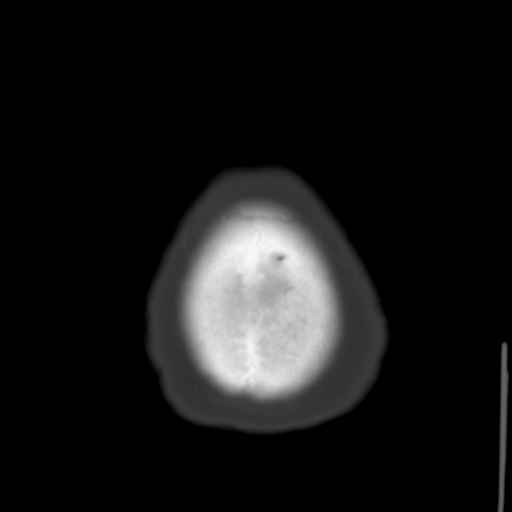

[Series 4: coronal soft tissue · coronal · 0.31mm/px · 3 of 65 slices shown]
[im 22/65  brain]
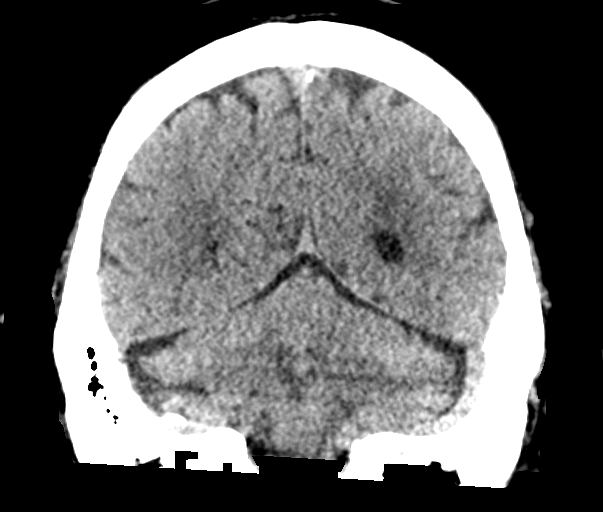
[im 29/65  brain]
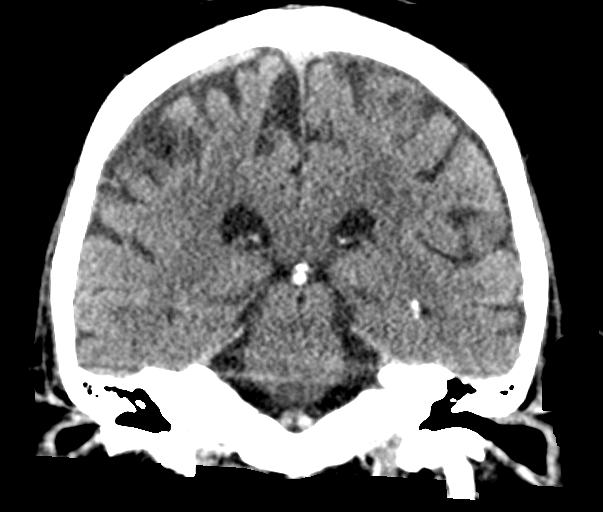
[im 36/65  brain]
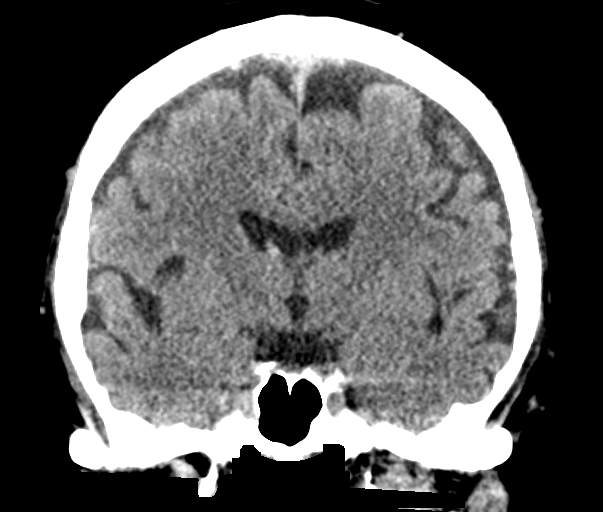

[Series 5: sagittal soft tissue · sagittal · 0.31mm/px · 3 of 56 slices shown]
[im 19/56  brain]
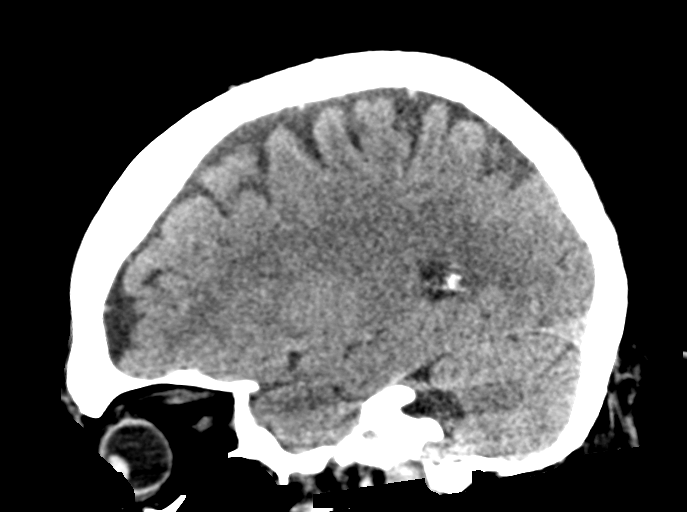
[im 28/56  brain]
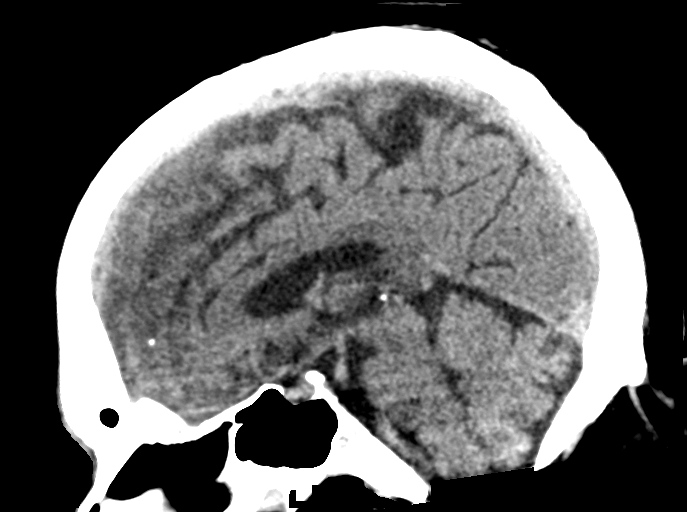
[im 37/56  brain]
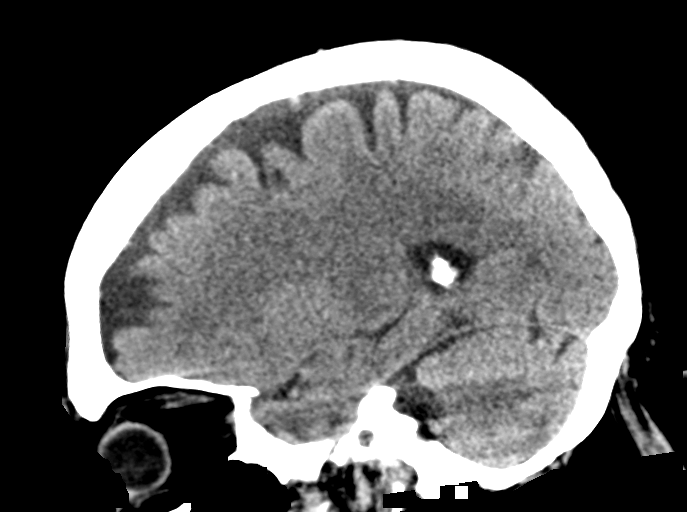

[15 of 47 positions shown; findings below may reference images not displayed]

FINDINGS: Brain:

Mild generalized parenchymal atrophy.

Mild ill-defined hypoattenuation within the cerebral white matter is
nonspecific, but consistent chronic small vessel ischemic disease.

There is no acute intracranial hemorrhage.

No demarcated cortical infarct.

No extra-axial fluid collection.

No evidence of intracranial mass.

No midline shift.

Vascular: No hyperdense vessel.  Atherosclerotic calcifications.

Skull: Normal. Negative for fracture or focal lesion.

Sinuses/Orbits: Visualized orbits show no acute finding. Mild
ethmoid sinus mucosal thickening. No significant mastoid effusion.
IMPRESSION: No CT evidence of acute intracranial abnormality.

Mild generalized parenchymal atrophy and chronic small vessel
ischemic disease.

Mild ethmoid sinus mucosal thickening.

## 2019-11-30 IMAGING — CT CT ANGIO NECK
2 of 8 series · 8 of 33 positions shown · IV contrast (APPLIED)
Comparison: None.

CLINICAL DATA: Cerebellar infarcts

EXAM:
CT ANGIOGRAPHY HEAD AND NECK
TECHNIQUE: Multidetector CT imaging of the head and neck was performed using
the standard protocol during bolus administration of intravenous
contrast. Multiplanar CT image reconstructions and MIPs were
obtained to evaluate the vascular anatomy. Carotid stenosis
measurements (when applicable) are obtained utilizing NASCET
criteria, using the distal internal carotid diameter as the
denominator.
CONTRAST:  75mL OMNIPAQUE IOHEXOL 350 MG/ML SOLN

[Series 505: cta head neck thins · axial · 0.50mm/px · z∈[-338,-84]mm · 6 of 714 slices shown]
[im 102/714  soft-tissue]
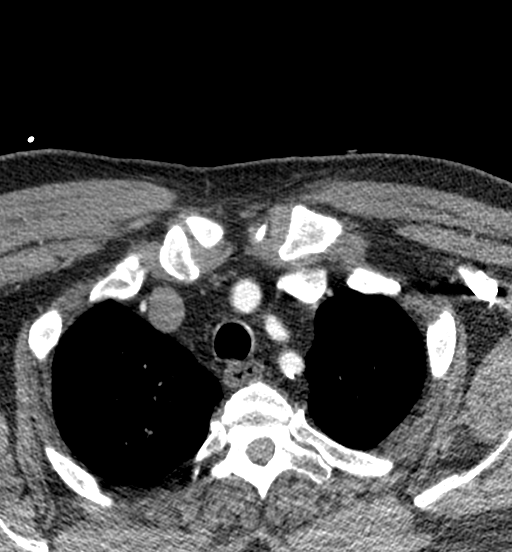
[im 204/714  bone]
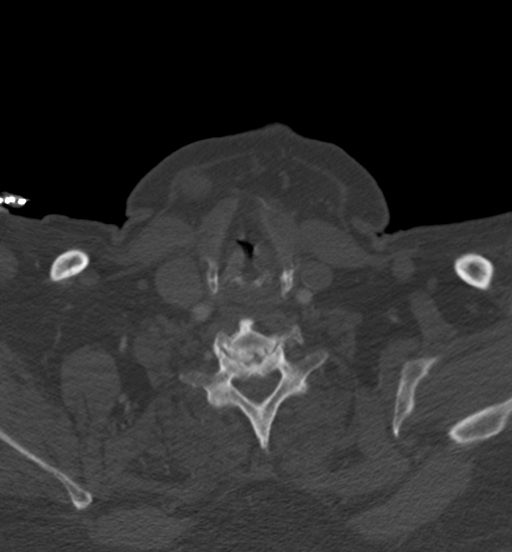
[im 306/714  soft-tissue]
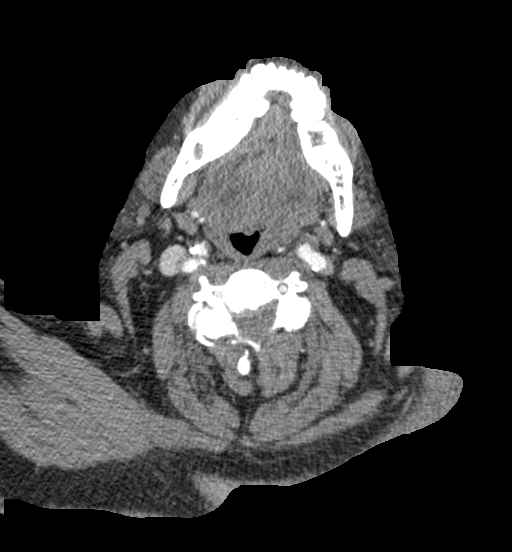
[im 408/714  bone]
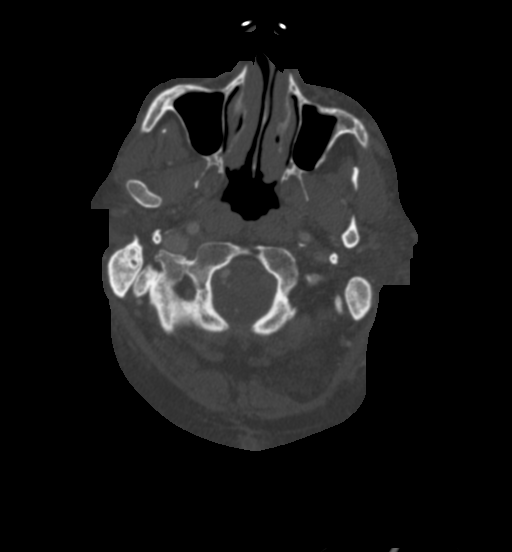
[im 510/714  soft-tissue]
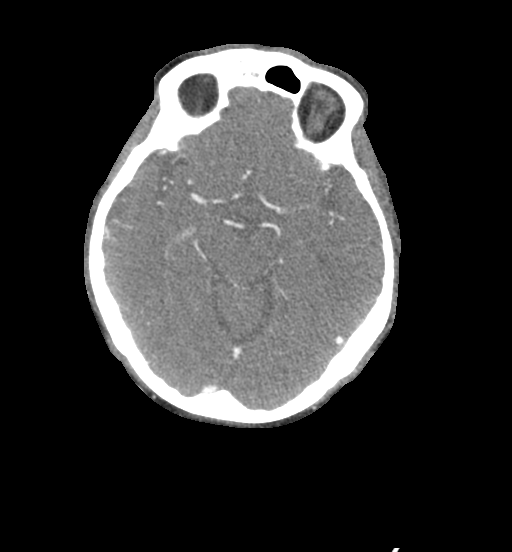
[im 612/714  bone]
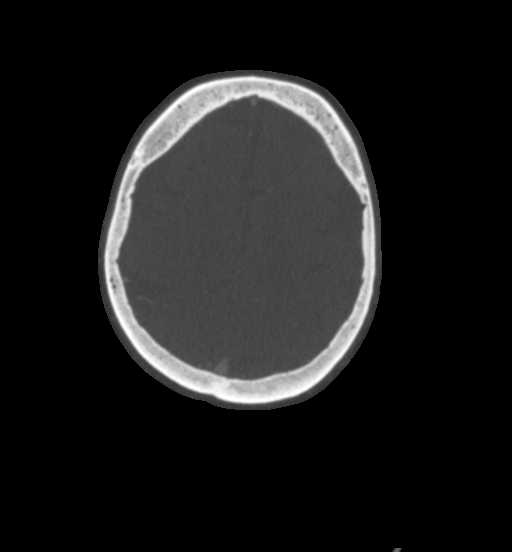

[Series 506: ax thin · axial · 0.50mm/px · z∈[-271,-152]mm · 2 of 357 slices shown]
[im 119/357  soft-tissue]
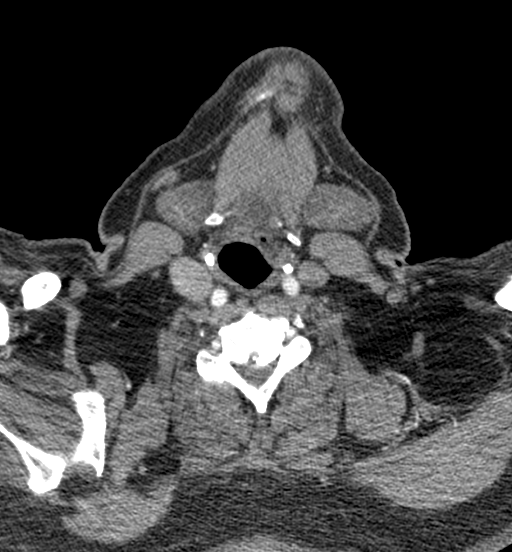
[im 238/357  soft-tissue]
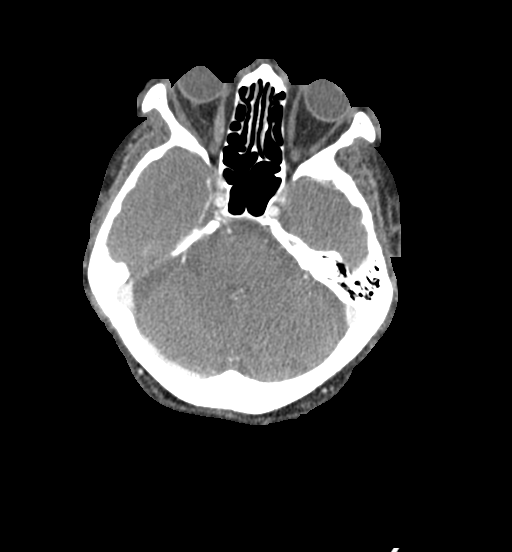

[8 of 33 positions shown; findings below may reference images not displayed]

FINDINGS: CTA NECK

Aortic arch: Great vessel origins are patent.

Right carotid system: Patent. Mild calcified plaque along the common
carotid. Mild calcified plaque at the ICA origin without measurable
stenosis.

Left carotid system: Patent. Mild calcified and noncalcified plaque
at the ICA origin causing minimal stenosis. Mild plaque along the
distal cervical ICA.

Vertebral arteries: Right vertebral artery is patent. Proximal left
vertebral arteries patent with calcified plaque at the origin likely
causing mild stenosis. There is loss of enhancement at the level of
the C2 transverse foramen without reconstitution in the neck.

Skeleton: Multilevel degenerative changes of the cervical spine.

Other neck: No mass or adenopathy.

Upper chest: No apical lung mass.

Review of the MIP images confirms the above findings

CTA HEAD

Anterior circulation: Intracranial internal carotid arteries are
patent with calcified plaque causing mild to moderate stenosis.
Anterior cerebral arteries are patent. Anterior communicating artery
is present. Middle cerebral arteries are patent.

Posterior circulation: Intracranial right vertebral artery and PICA
origins are patent. There is minimal reconstitution of the left
vertebral artery distally adjacent to the basilar artery origin.
Left PICA origin appears occluded. Basilar artery is patent with
moderate to marked stenosis with mid segment. Small caliber AICAs
are present. Superior cerebral artery origins are patent. Posterior
cerebral arteries are patent.

Venous sinuses: Patent as allowed by contrast bolus timing.

Review of the MIP images confirms the above findings
IMPRESSION: Occlusion of the left extracranial vertebral artery at the C2
transverse foramen with minimal reconstitution adjacent to the
basilar origin. Left PICA origin appears occluded.

Moderate to marked stenosis of the mid basilar artery.

No hemodynamically significant stenosis at the ICA origins.

## 2019-11-30 IMAGING — MR MR HEAD W/O CM
12 series · 45 of 48 positions shown · non-contrast
Comparison: [DATE] head CT.

CLINICAL DATA: Ataxia, suspected stroke.

EXAM:
MRI HEAD WITHOUT CONTRAST
TECHNIQUE: Multiplanar, multiecho pulse sequences of the brain and surrounding
structures were obtained without intravenous contrast.

[Series 5: ax dwi_tracew · axial · 3.0mm · 0.60mm/px · z∈[-90,+62]mm · 3 of 48 slices shown]
[im 1/48]
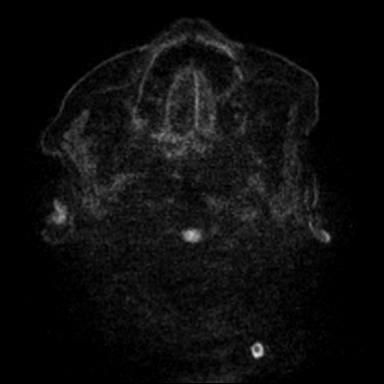
[im 24/48]
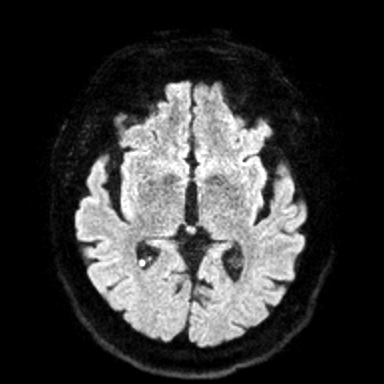
[im 48/48]
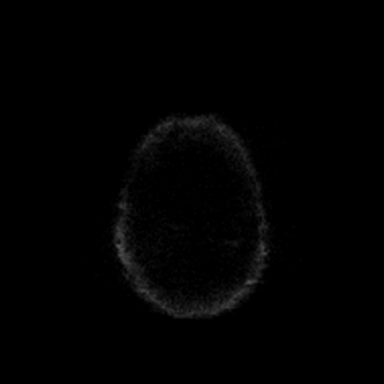

[Series 6: ax dwi_adc · axial · 3.0mm · 0.60mm/px · z∈[-90,+62]mm · 3 of 48 slices shown]
[im 1/48]
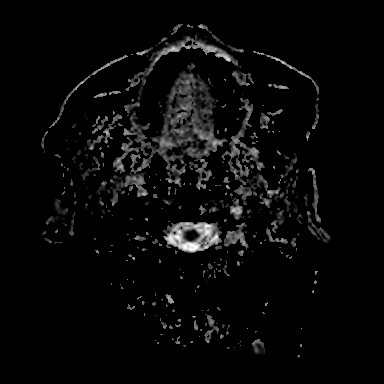
[im 24/48]
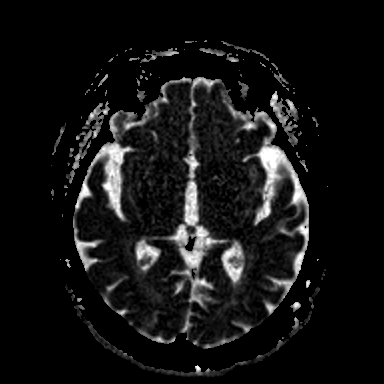
[im 48/48]
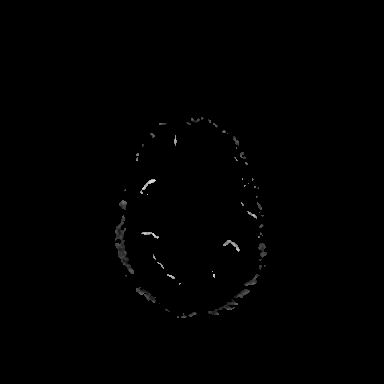

[Series 7: cor dwi_tracew · coronal · 5.0mm · 0.60mm/px · 3 of 36 slices shown]
[im 1/36]
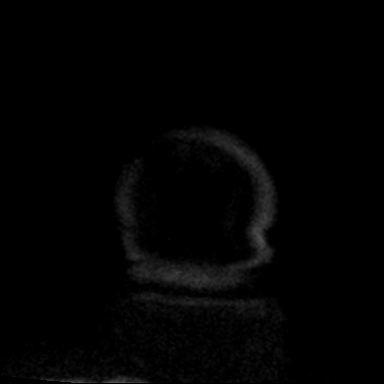
[im 18/36]
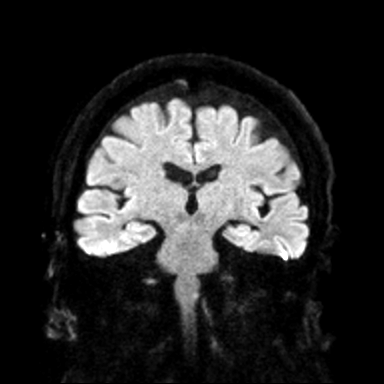
[im 36/36]
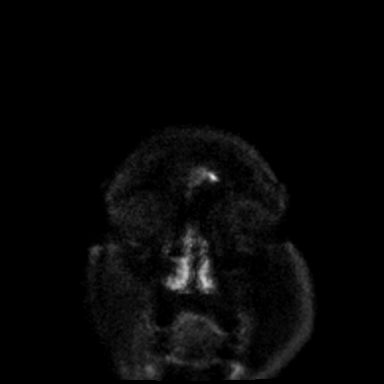

[Series 8: cor dwi_adc · coronal · 5.0mm · 0.60mm/px · 3 of 36 slices shown]
[im 1/36]
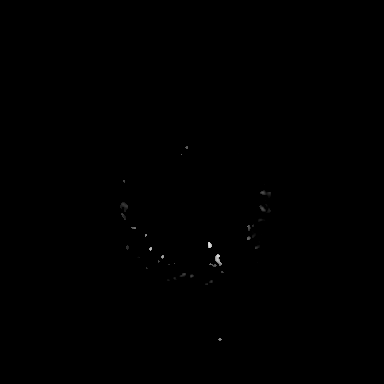
[im 18/36]
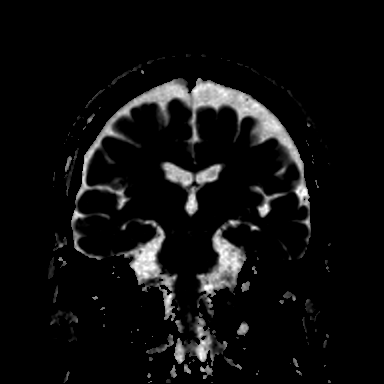
[im 36/36]
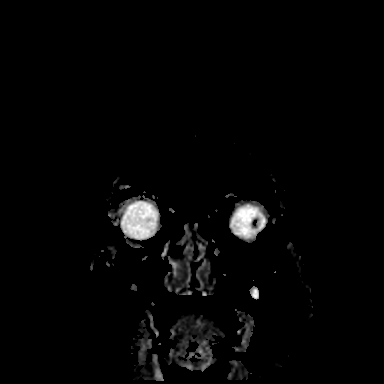

[Series 9: T1 · sagittal · 5.0mm · 0.62mm/px · 2 of 23 slices shown (1 of 2)]
[im 1/23]
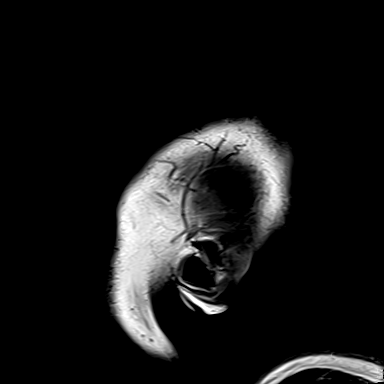
[im 23/23]
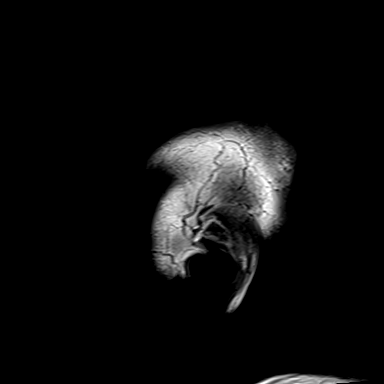

[Series 10: T2 · axial · 5.0mm · 0.53mm/px · z∈[-80,+60]mm · 2 of 25 slices shown (1 of 2)]
[im 1/25]
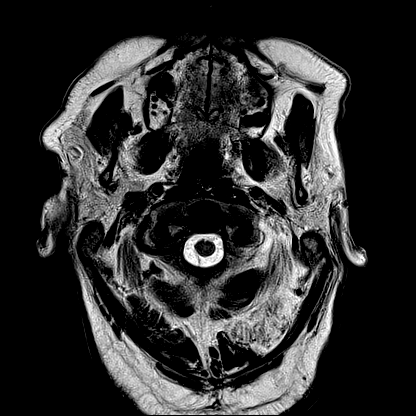
[im 25/25]
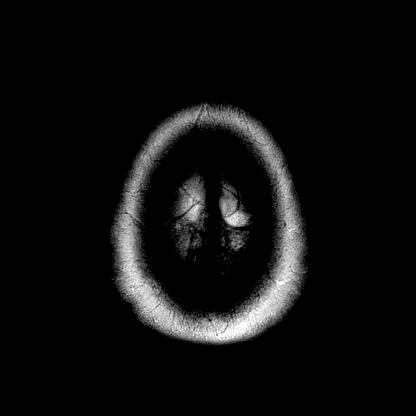

[Series 11: mag_images · axial · 3.0mm · 0.90mm/px · z∈[-95,+78]mm · 5 of 60 slices shown]
[im 1/60]
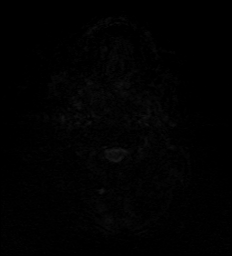
[im 15/60]
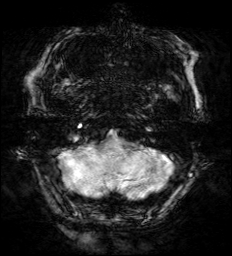
[im 30/60]
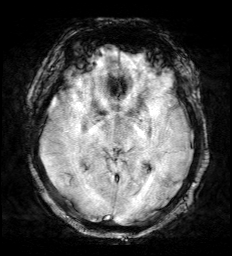
[im 45/60]
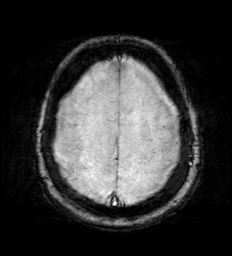
[im 60/60]
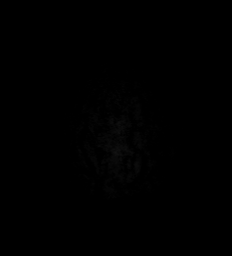

[Series 12: pha_images · axial · 3.0mm · 0.90mm/px · z∈[-92,+78]mm · 5 of 59 slices shown]
[im 1/59]
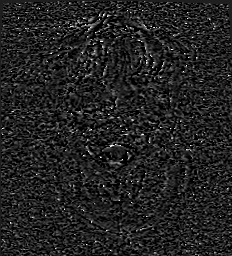
[im 15/59]
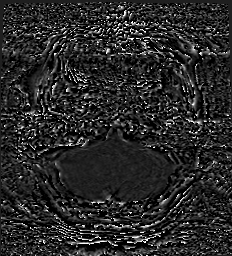
[im 30/59]
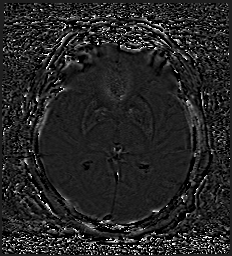
[im 44/59]
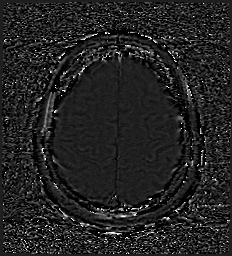
[im 59/59]
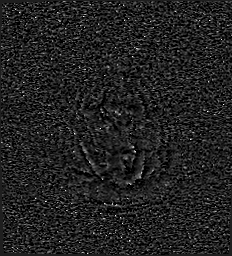

[Series 13: swi_images · axial · 3.0mm · 0.90mm/px · z∈[-95,+78]mm · 5 of 60 slices shown]
[im 1/60]
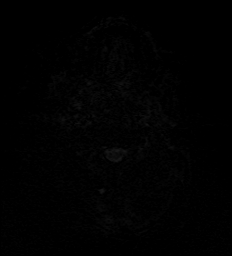
[im 15/60]
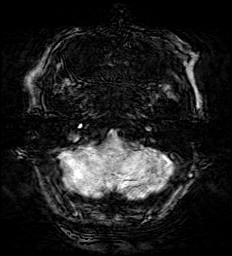
[im 30/60]
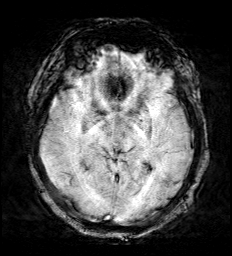
[im 45/60]
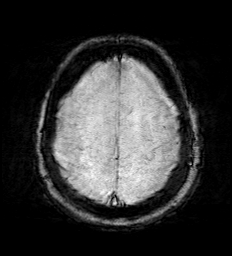
[im 60/60]
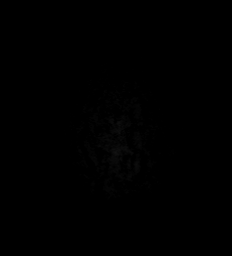

[Series 15: FLAIR · axial · 3.0mm · 0.53mm/px · z∈[-89,+69]mm · 4 of 55 slices shown]
[im 1/55]
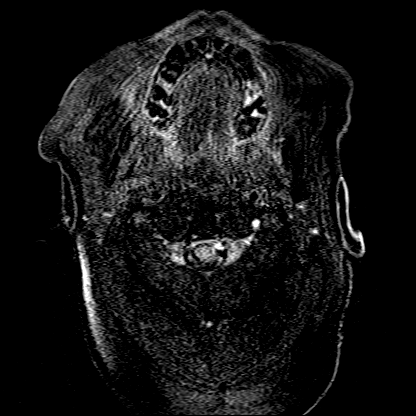
[im 19/55]
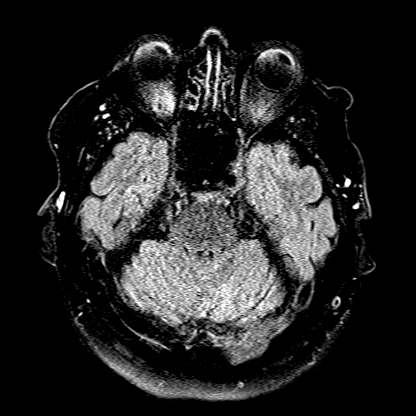
[im 37/55]
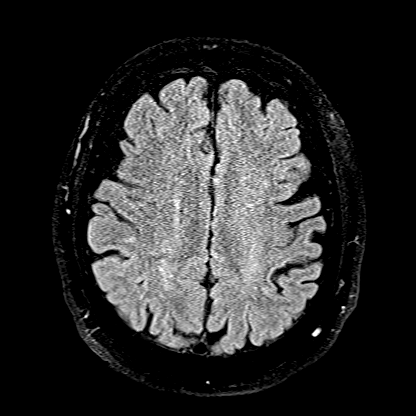
[im 55/55]
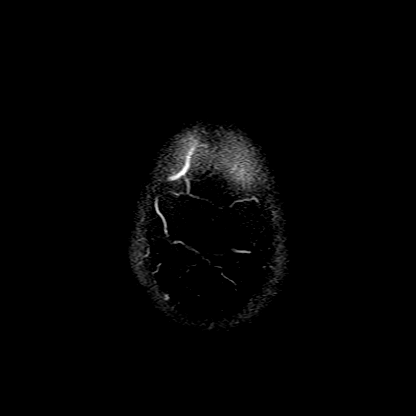

[Series 16: T1 · axial · 1.0mm · 0.98mm/px · z∈[-77,+62]mm · 8 of 144 slices shown (2 of 2)]
[im 1/144]
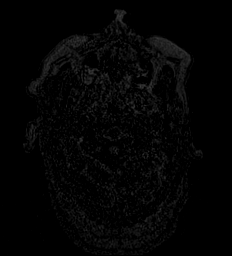
[im 29/144]
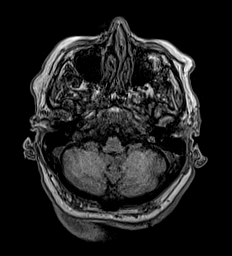
[im 43/144]
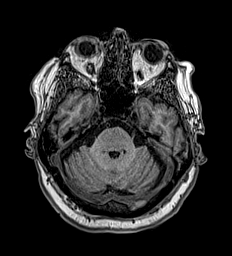
[im 58/144]
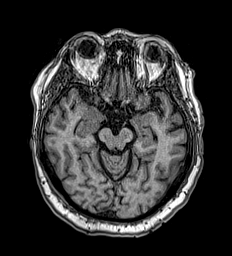
[im 86/144]
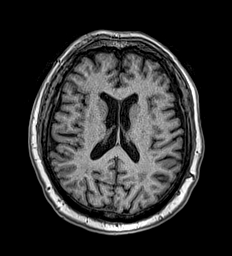
[im 101/144]
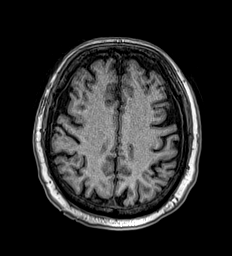
[im 115/144]
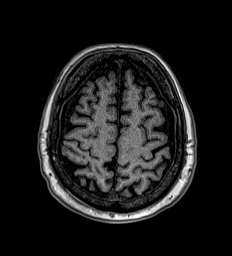
[im 144/144]
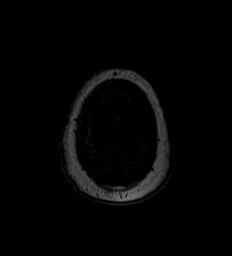

[Series 17: T2 · coronal · 5.0mm · 0.57mm/px · 2 of 29 slices shown (2 of 2)]
[im 1/29]
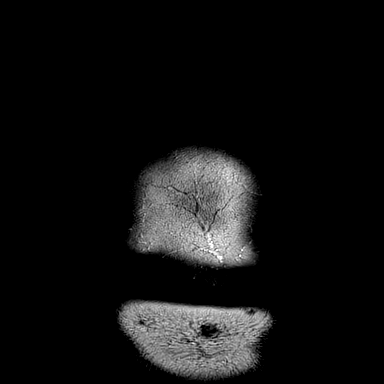
[im 29/29]
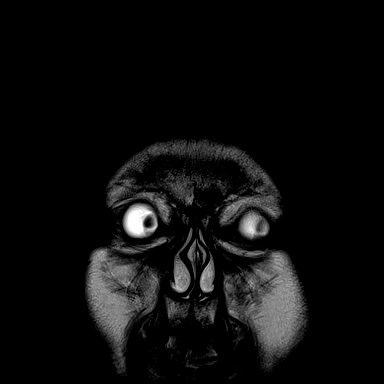

[45 of 48 positions shown; findings below may reference images not displayed]

FINDINGS: Brain: Mild diffuse parenchymal atrophy with ex vacuo dilatation.
Multifocal restricted diffusion with correlated of T2/FLAIR
hyperintensity involving the inferior left cerebellum reflects acute
infarcts. Punctate foci of restricted diffusion involving the right
cerebellum also reflect acute infarcts ([DATE]). No intracranial
hemorrhage.

Background scattered T2/FLAIR hyperintense foci involving the
periventricular and subcortical white matter reflect chronic
microvascular ischemic changes. No midline shift or extra-axial
fluid collection. No mass lesion.

Vascular: Normal flow voids.

Skull and upper cervical spine: Normal marrow signal.

Sinuses/Orbits: Normal orbits. Clear paranasal sinuses. No mastoid
effusion.

Other: None.
IMPRESSION: Acute inferior left cerebellar infarcts.

Punctate acute right cerebellar infarct.

Mild cerebral atrophy.  Mild chronic microvascular ischemic changes.

These results were called by telephone at the time of interpretation
on [DATE] at [DATE] to provider Dr. JHOSIELL , who verbally
acknowledged these results.

## 2019-11-30 MED ORDER — STROKE: EARLY STAGES OF RECOVERY BOOK: Freq: Once | Status: DC

## 2019-11-30 MED ORDER — ONDANSETRON HCL 4 MG/2ML IJ SOLN
4.0000 mg | Freq: Once | INTRAMUSCULAR | Status: AC
  Administered 2019-11-30: 4 mg via INTRAVENOUS
  Filled 2019-11-30: qty 2

## 2019-11-30 MED ORDER — ACETAMINOPHEN 160 MG/5ML PO SOLN
650.0000 mg | ORAL | Status: DC | PRN
  Filled 2019-11-30: qty 20.3

## 2019-11-30 MED ORDER — MECLIZINE HCL 25 MG PO TABS
50.0000 mg | ORAL_TABLET | Freq: Once | ORAL | Status: AC
  Administered 2019-11-30: 50 mg via ORAL
  Filled 2019-11-30: qty 2

## 2019-11-30 MED ORDER — POTASSIUM CHLORIDE 10 MEQ/100ML IV SOLN
10.0000 meq | INTRAVENOUS | Status: AC
  Administered 2019-11-30: 10 meq via INTRAVENOUS
  Filled 2019-11-30 (×2): qty 100

## 2019-11-30 MED ORDER — MAGNESIUM SULFATE 2 GM/50ML IV SOLN
2.0000 g | Freq: Once | INTRAVENOUS | Status: AC
  Administered 2019-11-30: 2 g via INTRAVENOUS
  Filled 2019-11-30: qty 50

## 2019-11-30 MED ORDER — PRAVASTATIN SODIUM 20 MG PO TABS
20.0000 mg | ORAL_TABLET | Freq: Every evening | ORAL | Status: DC
  Administered 2019-11-30 – 2019-12-01 (×2): 20 mg via ORAL
  Filled 2019-11-30 (×3): qty 1

## 2019-11-30 MED ORDER — ASPIRIN EC 81 MG PO TBEC
81.0000 mg | DELAYED_RELEASE_TABLET | Freq: Every day | ORAL | Status: DC
  Administered 2019-12-01: 81 mg via ORAL
  Filled 2019-11-30: qty 1

## 2019-11-30 MED ORDER — ENOXAPARIN SODIUM 40 MG/0.4ML ~~LOC~~ SOLN
40.0000 mg | SUBCUTANEOUS | Status: DC
  Administered 2019-11-30: 21:00:00 40 mg via SUBCUTANEOUS
  Filled 2019-11-30: qty 0.4

## 2019-11-30 MED ORDER — HYDRALAZINE HCL 20 MG/ML IJ SOLN: 5.0000 mg | INTRAMUSCULAR | Status: DC | PRN

## 2019-11-30 MED ORDER — ONDANSETRON HCL 4 MG/2ML IJ SOLN: 4.0000 mg | Freq: Three times a day (TID) | INTRAMUSCULAR | Status: DC | PRN

## 2019-11-30 MED ORDER — POTASSIUM CHLORIDE CRYS ER 20 MEQ PO TBCR
40.0000 meq | EXTENDED_RELEASE_TABLET | ORAL | Status: AC
  Administered 2019-11-30 (×2): 40 meq via ORAL
  Filled 2019-11-30 (×2): qty 2

## 2019-11-30 MED ORDER — ACETAMINOPHEN 325 MG PO TABS
650.0000 mg | ORAL_TABLET | ORAL | Status: DC | PRN
  Administered 2019-12-01: 05:00:00 650 mg via ORAL
  Filled 2019-11-30: qty 2

## 2019-11-30 MED ORDER — ACETAMINOPHEN 650 MG RE SUPP: 650.0000 mg | RECTAL | Status: DC | PRN

## 2019-11-30 MED ORDER — KCL IN DEXTROSE-NACL 20-5-0.45 MEQ/L-%-% IV SOLN
Freq: Once | INTRAVENOUS | Status: AC
  Filled 2019-11-30: qty 1000

## 2019-11-30 MED ORDER — INSULIN ASPART 100 UNIT/ML ~~LOC~~ SOLN: 0.0000 [IU] | Freq: Every day | SUBCUTANEOUS | Status: DC

## 2019-11-30 MED ORDER — SODIUM CHLORIDE 0.9 % IV SOLN: INTRAVENOUS | Status: DC

## 2019-11-30 MED ORDER — IOHEXOL 350 MG/ML SOLN
75.0000 mL | Freq: Once | INTRAVENOUS | Status: AC | PRN
  Administered 2019-11-30: 75 mL via INTRAVENOUS

## 2019-11-30 MED ORDER — ATENOLOL 50 MG PO TABS
50.0000 mg | ORAL_TABLET | Freq: Every day | ORAL | Status: DC
  Administered 2019-11-30 – 2019-12-01 (×2): 50 mg via ORAL
  Filled 2019-11-30 (×2): qty 1

## 2019-11-30 MED ORDER — FENOFIBRATE 160 MG PO TABS
160.0000 mg | ORAL_TABLET | Freq: Every evening | ORAL | Status: DC
  Administered 2019-11-30 – 2019-12-01 (×2): 160 mg via ORAL
  Filled 2019-11-30 (×3): qty 1

## 2019-11-30 MED ORDER — SENNOSIDES-DOCUSATE SODIUM 8.6-50 MG PO TABS: 1.0000 | ORAL_TABLET | Freq: Every evening | ORAL | Status: DC | PRN

## 2019-11-30 MED ORDER — INSULIN ASPART 100 UNIT/ML ~~LOC~~ SOLN
0.0000 [IU] | Freq: Three times a day (TID) | SUBCUTANEOUS | Status: DC
  Administered 2019-11-30 – 2019-12-01 (×4): 1 [IU] via SUBCUTANEOUS
  Filled 2019-11-30 (×4): qty 1

## 2019-11-30 MED ORDER — MECLIZINE HCL 25 MG PO TABS
25.0000 mg | ORAL_TABLET | Freq: Three times a day (TID) | ORAL | Status: DC | PRN
  Filled 2019-11-30: qty 1

## 2019-11-30 NOTE — ED Notes (Signed)
Pt in CT.

## 2019-11-30 NOTE — Progress Notes (Signed)
Spoke with Dr Blaine Hamper regarding troponins, states that it is demand ischemia and that pt will be fine coming to 1C, also mentioned K+ 2.5 hasn't received all of potassium that was ordered and that pt may need a recheck of the critical potassium tonight, new order to recheck potassium tonight and RN assigned will give remaining potassium when pt comes to unit

## 2019-11-30 NOTE — ED Triage Notes (Addendum)
Pt comes via EMS from home with c/o weakness and dizziness. Pt states last night he got sick and vomited. Pt states the room was spending and his head hurt. Pt also states ringing in ears  Pt is A&OX4.EMS reports VSS. IV in place fluids infusing at this time.

## 2019-11-30 NOTE — Consult Note (Signed)
Requesting Physician: Blaine Hamper    Chief Complaint: Dizziness  I have been asked by Dr. Blaine Hamper to see this patient in consultation for acute infarct.  HPI: Cleburn Maiolo is an 69 y.o. male with medical history significant of hypertension, hyperlipidemia, diabetes mellitus, who presents with dizziness and gait ataxia.  Patient states that his symptoms started at about 6:30 PM yesterday.  He has dizziness and poor balance.  He has been feeling room spinning around him.  Has some left-sided headache.  He has generalized weakness, denies unilateral numbness or tingling his extremities.  No facial droop or slurred speech.  Patient states that he has nausea and vomited several times with brownish colored material last night.  His nausea and vomiting has resolved but remains vertiginous.    Date last known well: 11/29/2019 Time last known well: Time: 18:30 tPA Given: No: Outside time window  Past Medical History:  Diagnosis Date   Arthritis    Diabetes mellitus without complication ([place])    Hemorrhoids    Hyperlipidemia    Hypertension     Past Surgical History:  Procedure Laterality Date   COLONOSCOPY WITH PROPOFOL N/A 10/21/2014   Procedure: COLONOSCOPY WITH PROPOFOL;  Surgeon: Josefine Class, MD;  Location: [facility] ENDOSCOPY;  Service: Endoscopy;  Laterality: N/A;    Family History  Problem Relation Age of Onset   Heart attack Mother    Clotting disorder Father    Social History:  reports that he has quit smoking. He has never used smokeless tobacco. He reports current alcohol use of about 1.0 standard drink of alcohol per week. He reports that he does not use drugs.  Allergies:  Allergies  Allergen Reactions   Amoxicillin Hives   Clarithromycin Hives    Medications:  I have reviewed the patient's current medications. Prior to Admission:  Medications Prior to Admission  Medication Sig Dispense Refill Last Dose   amLODipine (NORVASC) 10 MG tablet Take 10 mg by mouth  daily.   11/29/2019 at 1900   aspirin 81 MG EC tablet Take by mouth.   11/29/2019 at 1900   atenolol-chlorthalidone (TENORETIC) 50-25 MG per tablet Take 1 tablet by mouth daily.   11/29/2019 at 1900   fenofibrate 160 MG tablet Take 160 mg by mouth daily.   11/29/2019 at 1900   ibuprofen (ADVIL) 200 MG tablet Take 200 mg by mouth every 6 (six) hours as needed.   PRN at PRN   metFORMIN (GLUCOPHAGE-XR) 500 MG 24 hr tablet Take 2 tablets by mouth once daily   11/29/2019 at 1900   potassium chloride SA (K-DUR,KLOR-CON) 20 MEQ tablet Take 20 mEq by mouth 2 (two) times daily.   11/29/2019 at 1900   pravastatin (PRAVACHOL) 20 MG tablet Take 20 mg by mouth daily.   11/29/2019 at 1900   sildenafil (VIAGRA) 100 MG tablet Take by mouth.    PRN at PRN   Scheduled:   stroke: mapping our early stages of recovery book   Does not apply Once   [START ON 12/01/2019] aspirin EC  81 mg Oral Daily   atenolol  50 mg Oral Daily   enoxaparin (LOVENOX) injection  40 mg Subcutaneous Q24H   fenofibrate  160 mg Oral QPM   insulin aspart  0-5 Units Subcutaneous QHS   insulin aspart  0-9 Units Subcutaneous TID WC   pravastatin  20 mg Oral QPM    ROS: History obtained from the patient  General ROS: negative for - chills, fatigue, fever, night sweats, weight  gain or weight loss Psychological ROS: negative for - behavioral disorder, hallucinations, memory difficulties, mood swings or suicidal ideation Ophthalmic ROS: negative for - blurry vision, double vision, eye pain or loss of vision ENT ROS: ertigo Allergy and Immunology ROS: negative for - hives or itchy/watery eyes Hematological and Lymphatic ROS: negative for - bleeding problems, bruising or swollen lymph nodes Endocrine ROS: negative for - galactorrhea, hair pattern changes, polydipsia/polyuria or temperature intolerance Respiratory ROS: negative for - cough, hemoptysis, shortness of breath or wheezing Cardiovascular ROS: negative for - chest pain,  dyspnea on exertion, edema or irregular heartbeat Gastrointestinal ROS: as noted in HPI Genito-Urinary ROS: negative for - dysuria, hematuria, incontinence or urinary frequency/urgency Musculoskeletal ROS: negative for - joint swelling or muscular weakness Neurological ROS: as noted in HPI Dermatological ROS: negative for rash and skin lesion changes  Physical Examination: Blood pressure (!) 142/79, pulse 61, temperature (!) 97.5 F (36.4 C), temperature source Oral, resp. rate 16, height 5\' 9"  (1.753 m), weight 120.2 kg, SpO2 98 %.  HEENT-  Normocephalic, no lesions, without obvious abnormality.  Normal external eye and conjunctiva.  Normal TM's bilaterally.  Normal auditory canals and external ears. Normal external nose, mucus membranes and septum.  Normal pharynx. Cardiovascular- S1, S2 normal, pulses palpable throughout   Lungs- chest clear, no wheezing, rales, normal symmetric air entry Abdomen- soft, non-tender; bowel sounds normal; no masses,  no organomegaly Extremities- no edema Lymph-no adenopathy palpable Musculoskeletal-no joint tenderness, deformity or swelling Skin-warm and dry, no hyperpigmentation, vitiligo, or suspicious lesions  Neurological Examination   Mental Status: Alert, oriented, thought content appropriate.  Speech fluent without evidence of aphasia.  Able to follow 3 step commands without difficulty. Cranial Nerves: II: Visual fields grossly normal, pupils equal, round, reactive to light and accommodation III,IV, VI: left ptosis, extra-ocular motions intact bilaterally V,VII: mild decrease in the left NLF, facial light touch sensation normal bilaterally VIII: hearing normal bilaterally IX,X: gag reflex present XI: bilateral shoulder shrug XII: midline tongue extension Motor: Right : Upper extremity   5/5    Left:     Upper extremity   5/5  Lower extremity   5/5     Lower extremity   5/5 Tone and bulk:normal tone throughout; no atrophy noted Sensory:  Pinprick and light touch intact throughout, bilaterally Deep Tendon Reflexes: Symmetric throughout Plantars: Right: mute   Left: mute Cerebellar: Dysmetria with left finger-to-nose testing Gait: not tested due to safety concerns    Laboratory Studies:  Basic Metabolic Panel: Recent Labs  Lab 11/30/19 1013  NA 142  K 2.5*  CL 113*  CO2 17*  GLUCOSE 161*  BUN 23  CREATININE 1.04  CALCIUM 7.4*    Liver Function Tests: Recent Labs  Lab 11/30/19 1013  AST 20  ALT 20  ALKPHOS 24*  BILITOT 0.9  PROT 6.0*  ALBUMIN 3.4*   No results for input(s): LIPASE, AMYLASE in the last 168 hours. No results for input(s): AMMONIA in the last 168 hours.  CBC: Recent Labs  Lab 11/30/19 1013  WBC 10.1  NEUTROABS 8.7*  HGB 13.3  HCT 38.3*  MCV 93.2  PLT 203    Cardiac Enzymes: No results for input(s): CKTOTAL, CKMB, CKMBINDEX, TROPONINI in the last 168 hours.  BNP: Invalid input(s): POCBNP  CBG: Recent Labs  Lab 11/30/19 1811 11/30/19 2049  GLUCAP 144* 155*    Microbiology: Results for orders placed or performed during the hospital encounter of 11/30/19  SARS Coronavirus 2 by RT PCR (hospital  order, performed in [facility] hospital lab) Nasopharyngeal Nasopharyngeal Swab     Status: None   Collection Time: 11/30/19 10:13 AM   Specimen: Nasopharyngeal Swab  Result Value Ref Range Status   SARS Coronavirus 2 NEGATIVE NEGATIVE Final    Comment: (NOTE) SARS-CoV-2 target nucleic acids are NOT DETECTED.  The SARS-CoV-2 RNA is generally detectable in upper and lower respiratory specimens during the acute phase of infection. The lowest concentration of SARS-CoV-2 viral copies this assay can detect is 250 copies / mL. A negative result does not preclude SARS-CoV-2 infection and should not be used as the sole basis for treatment or other patient management decisions.  A negative result may occur with improper specimen collection / handling, submission of specimen  other than nasopharyngeal swab, presence of viral mutation(s) within the areas targeted by this assay, and inadequate number of viral copies (<250 copies / mL). A negative result must be combined with clinical observations, patient history, and epidemiological information.  Fact Sheet for Patients:   StrictlyIdeas.no  Fact Sheet for Healthcare Providers: BankingDealers.co.za  This test is not yet approved or  cleared by the [place] FDA and has been authorized for detection and/or diagnosis of SARS-CoV-2 by FDA under an Emergency Use Authorization (EUA).  This EUA will remain in effect (meaning this test can be used) for the duration of the COVID-19 declaration under Section 564(b)(1) of the Act, 21 U.S.C. section 360bbb-3(b)(1), unless the authorization is terminated or revoked sooner.  Performed at [facility], [address] [place], [place] [address]     Coagulation Studies: No results for input(s): LABPROT, INR in the last 72 hours.  Urinalysis: No results for input(s): COLORURINE, LABSPEC, PHURINE, GLUCOSEU, HGBUR, BILIRUBINUR, KETONESUR, PROTEINUR, UROBILINOGEN, NITRITE, LEUKOCYTESUR in the last 168 hours.  Invalid input(s): APPERANCEUR  Lipid Panel: No results found for: CHOL, TRIG, HDL, CHOLHDL, VLDL, LDLCALC  HgbA1C: No results found for: HGBA1C  Urine Drug Screen:  No results found for: LABOPIA, COCAINSCRNUR, LABBENZ, AMPHETMU, THCU, LABBARB  Alcohol Level: No results for input(s): ETH in the last 168 hours.  Other results: EKG: sinus rhythm at 61 bpm, 1st degree AV block.  Imaging: CT ANGIO HEAD W OR WO CONTRAST  Result Date: 11/30/2019 CLINICAL DATA:  Cerebellar infarcts EXAM: CT ANGIOGRAPHY HEAD AND NECK TECHNIQUE: Multidetector CT imaging of the head and neck was performed using the standard protocol during bolus administration of intravenous contrast. Multiplanar CT image reconstructions and  MIPs were obtained to evaluate the vascular anatomy. Carotid stenosis measurements (when applicable) are obtained utilizing NASCET criteria, using the distal internal carotid diameter as the denominator. CONTRAST:  72mL OMNIPAQUE IOHEXOL 350 MG/ML SOLN COMPARISON:  None. FINDINGS: CTA NECK Aortic arch: Great vessel origins are patent. Right carotid system: Patent. Mild calcified plaque along the common carotid. Mild calcified plaque at the ICA origin without measurable stenosis. Left carotid system: Patent. Mild calcified and noncalcified plaque at the ICA origin causing minimal stenosis. Mild plaque along the distal cervical ICA. Vertebral arteries: Right vertebral artery is patent. Proximal left vertebral arteries patent with calcified plaque at the origin likely causing mild stenosis. There is loss of enhancement at the level of the C2 transverse foramen without reconstitution in the neck. Skeleton: Multilevel degenerative changes of the cervical spine. Other neck: No mass or adenopathy. Upper chest: No apical lung mass. Review of the MIP images confirms the above findings CTA HEAD Anterior circulation: Intracranial internal carotid arteries are patent with calcified plaque causing mild to moderate  stenosis. Anterior cerebral arteries are patent. Anterior communicating artery is present. Middle cerebral arteries are patent. Posterior circulation: Intracranial right vertebral artery and PICA origins are patent. There is minimal reconstitution of the left vertebral artery distally adjacent to the basilar artery origin. Left PICA origin appears occluded. Basilar artery is patent with moderate to marked stenosis with mid segment. Small caliber AICAs are present. Superior cerebral artery origins are patent. Posterior cerebral arteries are patent. Venous sinuses: Patent as allowed by contrast bolus timing. 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 Left PICA origin appears occluded. 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 No hemodynamically significant stenosis at the ICA origins. Electronically Signed   By: Macy Mis M.D.   On: 11/30/2019 16:55   CT Head Wo Contrast  Result Date: 11/30/2019 CLINICAL DATA:  Ataxia, stroke suspected. Additional provided: Weakness and dizziness, vomiting EXAM: CT HEAD WITHOUT CONTRAST TECHNIQUE: Contiguous axial images were obtained from the base of the skull through the vertex without intravenous contrast. COMPARISON:  No pertinent prior studies available for comparison. FINDINGS: Brain: Mild generalized parenchymal atrophy. Mild ill-defined hypoattenuation within the cerebral white matter is nonspecific, but consistent chronic small vessel ischemic disease. There is no acute intracranial hemorrhage. No demarcated cortical infarct. No extra-axial fluid collection. No evidence of intracranial mass. No midline shift. Vascular: No hyperdense vessel.  Atherosclerotic calcifications. Skull: Normal. Negative for fracture or focal lesion. Sinuses/Orbits: Visualized orbits show no acute finding. Mild ethmoid sinus mucosal thickening. No significant mastoid effusion. IMPRESSION: No CT evidence of acute intracranial abnormality. Mild generalized parenchymal atrophy and chronic small vessel ischemic disease. Mild ethmoid sinus mucosal thickening. Electronically Signed   By: Kellie Simmering DO   On: 11/30/2019 10:54   CT ANGIO NECK W OR WO CONTRAST  Result Date: 11/30/2019 CLINICAL DATA:  Cerebellar infarcts EXAM: CT ANGIOGRAPHY HEAD AND NECK TECHNIQUE: Multidetector CT imaging of the head and neck was performed using the standard protocol during bolus administration of intravenous contrast. Multiplanar CT image reconstructions and MIPs were obtained to evaluate the vascular anatomy. Carotid stenosis measurements (when  applicable) are obtained utilizing NASCET criteria, using the distal internal carotid diameter as the denominator. CONTRAST:  25mL OMNIPAQUE IOHEXOL 350 MG/ML SOLN COMPARISON:  None. FINDINGS: CTA NECK Aortic arch: Great vessel origins are patent. Right carotid system: Patent. Mild calcified plaque along the common carotid. Mild calcified plaque at the ICA origin without measurable stenosis. Left carotid system: Patent. Mild calcified and noncalcified plaque at the ICA origin causing minimal stenosis. Mild plaque along the distal cervical ICA. Vertebral arteries: Right vertebral artery is patent. Proximal left vertebral arteries patent with calcified plaque at the origin likely causing mild stenosis. There is loss of enhancement at the level of the C2 transverse foramen without reconstitution in the neck. Skeleton: Multilevel degenerative changes of the cervical spine. Other neck: No mass or adenopathy. Upper chest: No apical lung mass. Review of the MIP images confirms the above findings CTA HEAD Anterior circulation: Intracranial internal carotid arteries are patent with calcified plaque causing mild to moderate stenosis. Anterior cerebral arteries are patent. Anterior communicating artery is present. Middle cerebral arteries are patent. Posterior circulation: Intracranial right vertebral artery and PICA origins are patent. There is minimal reconstitution of the left vertebral artery distally adjacent to the basilar artery origin. Left PICA origin appears occluded. Basilar artery is patent  with moderate to marked stenosis with mid segment. Small caliber AICAs are present. Superior cerebral artery origins are patent. Posterior cerebral arteries are patent. Venous sinuses: Patent as allowed by contrast bolus timing. 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 Left PICA origin appears occluded. 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 No hemodynamically significant stenosis at the ICA origins. Electronically Signed   By: Macy Mis M.D.   On: 11/30/2019 16:55   MR BRAIN WO CONTRAST  Result Date: 11/30/2019 CLINICAL DATA:  Ataxia, suspected stroke. EXAM: MRI HEAD WITHOUT CONTRAST TECHNIQUE: Multiplanar, multiecho pulse sequences of the brain and surrounding structures were obtained without intravenous contrast. COMPARISON:  11/30/2019 head CT. FINDINGS: Brain: Mild diffuse parenchymal atrophy with ex vacuo dilatation. Multifocal restricted diffusion with correlated of T2/FLAIR hyperintensity involving the inferior left cerebellum reflects acute infarcts. Punctate foci of restricted diffusion involving the right cerebellum also reflect acute infarcts (5:14). No intracranial hemorrhage. Background scattered T2/FLAIR hyperintense foci involving the periventricular and subcortical white matter reflect chronic microvascular ischemic changes. No midline shift or extra-axial fluid collection. No mass lesion. Vascular: Normal flow voids. Skull and upper cervical spine: Normal marrow signal. Sinuses/Orbits: Normal orbits. Clear paranasal sinuses. No mastoid effusion. Other: None. IMPRESSION: Acute inferior left cerebellar infarcts. Punctate acute right cerebellar infarct. Mild cerebral atrophy.  Mild chronic microvascular ischemic changes. These results were called by telephone at the time of interpretation on 11/30/2019 at 2:17 Pm to provider Dr. Ivor Costa , who verbally acknowledged these results. Electronically Signed   By: Primitivo Gauze M.D.   On: 11/30/2019 14:18    Assessment: 69 y.o. male medical history significant of hypertension, hyperlipidemia, diabetes mellitus, who presents with dizziness and gait ataxia.  Patient on statin and ASA prior to admission.  MRI of the brain personally reviewed and reveals acute left cerebellar infarcts  and a punctate right cerebellar infarct. 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  Infarcts likely embolic from these lesions.  Echocardiogram is pending.  A1c and LDL pending.     Stroke Risk Factors - diabetes mellitus, hyperlipidemia and hypertension  Plan: 1. HgbA1c, fasting lipid panel pending 2. PT consult, OT consult, Speech consult 3. Echocardiogram pending 4. Prophylactic therapy-Dual antiplatelet therapy with ASA 81mg  and Plavix 75mg  for three weeks with change to Plavix 75mg  daily alone as monotherapy after that time. 5. NPO until RN stroke swallow screen 6. Telemetry monitoring 7. Frequent neuro checks 8. Continue statin   Alexis Goodell, MD Neurology [phone] 11/30/2019, 10:57 PM

## 2019-11-30 NOTE — ED Provider Notes (Signed)
ER Provider Note       Time seen: 9:59 AM    I have reviewed the vital signs and the nursing notes.  HISTORY   Chief Complaint No chief complaint on file.    HPI George Schmidt is a 69 y.o. male with a history of arthritis, diabetes, hyperlipidemia, hypertension who presents today for weakness and dizziness.  Patient states last night he got sick and vomited, states the room was spinning and his head hurt.  Symptoms are not as bad this morning but he feels weak and off-balance still.  He had this happen once a month ago that resolved spontaneously.  Past Medical History:  Diagnosis Date  . Arthritis   . Diabetes mellitus without complication ([place])   . Hemorrhoids   . Hyperlipidemia   . Hypertension     Past Surgical History:  Procedure Laterality Date  . COLONOSCOPY WITH PROPOFOL N/A 10/21/2014   Procedure: COLONOSCOPY WITH PROPOFOL;  Surgeon: Josefine Class, MD;  Location[facility] ENDOSCOPY;  Service: Endoscopy;  Laterality: N/A;    Allergies Amoxicillin and Clarithromycin   Review of Systems Constitutional: Negative for fever. Cardiovascular: Negative for chest pain. Respiratory: Negative for shortness of breath. Gastrointestinal: Negative for abdominal pain, positive for nausea vomiting Musculoskeletal: Negative for back pain. Skin: Negative for rash. Neurological: Positive for headache, weakness, room spinning  All systems negative/normal/unremarkable except as stated in the HPI  ____________________________________________   PHYSICAL EXAM:  VITAL SIGNS: Vitals:   11/30/19 1018 11/30/19 1055  BP: 135/71   Pulse: (!) 54 (!) 104  Resp: 20 20  Temp: 97.8 F (36.6 C)   SpO2: 97% 100%    Constitutional: Alert and oriented. Well appearing and in no distress. Eyes: Conjunctivae are normal. Normal extraocular movements. ENT      Head: Normocephalic and atraumatic.      Nose: No congestion/rhinnorhea.      Mouth/Throat: Mucous membranes are  moist.      Neck: No stridor. Cardiovascular: Normal rate, regular rhythm. No murmurs, rubs, or gallops. Respiratory: Normal respiratory effort without tachypnea nor retractions. Breath sounds are clear and equal bilaterally. No wheezes/rales/rhonchi. Gastrointestinal: Soft and nontender. Normal bowel sounds Musculoskeletal: Nontender with normal range of motion in extremities. No lower extremity tenderness nor edema. Neurologic:  Normal speech and language. No gross focal neurologic deficits are appreciated.  Skin:  Skin is warm, dry and intact. No rash noted. Psychiatric: Speech and behavior are normal.  ____________________________________________  EKG: Interpreted by me.  Sinus rhythm with rate of 56 bpm, PACs, prolonged PR interval, left axis deviation  ____________________________________________   LABS (pertinent positives/negatives)  Labs Reviewed  CBC WITH DIFFERENTIAL/PLATELET - Abnormal; Notable for the following components:      Result Value   RBC 4.11 (*)    HCT 38.3 (*)    Neutro Abs 8.7 (*)    All other components within normal limits  COMPREHENSIVE METABOLIC PANEL - Abnormal; Notable for the following components:   Potassium 2.5 (*)    Chloride 113 (*)    CO2 17 (*)    Glucose, Bld 161 (*)    Calcium 7.4 (*)    Total Protein 6.0 (*)    Albumin 3.4 (*)    Alkaline Phosphatase 24 (*)    All other components within normal limits  TROPONIN I (HIGH SENSITIVITY) - Abnormal; Notable for the following components:   Troponin I (High Sensitivity) 85 (*)    All other components within normal limits  SARS CORONAVIRUS 2 BY  RT PCR ([facility])  CBG MONITORING, ED  TROPONIN I (HIGH SENSITIVITY)   CRITICAL CARE Performed by: Laurence Aly   Total critical care time: 30 minutes  Critical care time was exclusive of separately billable procedures and treating other patients.  Critical care was necessary to treat or  prevent imminent or life-threatening deterioration.  Critical care was time spent personally by me on the following activities: development of treatment plan with patient and/or surrogate as well as nursing, discussions with consultants, evaluation of patient's response to treatment, examination of patient, obtaining history from patient or surrogate, ordering and performing treatments and interventions, ordering and review of laboratory studies, ordering and review of radiographic studies, pulse oximetry and re-evaluation of patient's condition.  RADIOLOGY  Images were viewed by me CT head IMPRESSION: No CT evidence of acute intracranial abnormality.  Mild generalized parenchymal atrophy and chronic small vessel ischemic disease.  Mild ethmoid sinus mucosal thickening.  DIFFERENTIAL DIAGNOSIS  Peripheral vertigo, central vertigo, dehydration, electrolyte abnormality, MI, occult infection  ASSESSMENT AND PLAN  Vertigo, elevated troponin, hypokalemia   Plan: The patient had presented for dizziness and weakness that he describes as room spinning. Patient's labs revealed multiple abnormalities including hypokalemia with a potassium of 2.5, possibly related to chlorthalidone.  Troponin was elevated to 85 of uncertain etiology, EKG has not revealed any acute process.  We started him on saline including potassium.  CT head was unremarkable but he may require an MRI of his brain.  I will discuss with the hospitalist for admission.  Lenise Arena MD    Note: This note was generated in part or whole with voice recognition software. Voice recognition is usually quite accurate but there are transcription errors that can and very often do occur. I apologize for any typographical errors that were not detected and corrected.     Earleen Newport, MD 11/30/19 1140

## 2019-11-30 NOTE — ED Notes (Signed)
Date and time results received: 11/30/19 1253  Test: Troponin Critical Value: 103  Name of Provider Notified: Dr Blaine Hamper, MD

## 2019-11-30 NOTE — ED Notes (Signed)
Date and time results received: 11/30/19 1053 (use smartphrase ".now" to insert current time)  Test: K+ Critical Value: 2.5  Name of Provider Notified: Jimmye Norman  Orders Received? Or Actions Taken?: Orders Received - See Orders for details

## 2019-11-30 NOTE — H&P (Signed)
History and Physical    George Schmidt ZTI:[id number] DOB: 26-Mar-1951 DOA: 11/30/2019  Referring MD/NP/PA:   PCP: George Pink, MD   Patient coming from:  The patient is coming from home.  At baseline, pt is independent for most of ADL.        Chief Complaint: dizziness  HPI: George Schmidt is a 69 y.o. male with medical history significant of hypertension, hyperlipidemia, diabetes mellitus, who presents with dizziness.  Patient states that his symptoms started at about 6:30 PM yesterday.  He has dizziness and poor balance.  He has been feeling room spinning around him.  Has some left-sided headache.  He has generalized weakness, denies unilateral numbness or tingling his extremities.  No facial droop or slurred speech.  Patient states that he has nausea and vomited several times with brownish colored material last night.  His nausea and vomiting has resolved.  Currently patient does not have nausea, vomiting, diarrhea or abdominal pain.  Patient denies chest pain, shortness breath, cough.  No fever or chills.  No symptoms of UTI.  ED Course: pt was found to have troponin 85, 103, negative COVID-19 PCR, potassium 2.5, renal function okay, temperature normal, blood pressure 135/7 1, tachycardia, RR 20, oxygen saturation 100% on room air.  CT head negative for acute intracranial abnormalities.  Patient is placed on MedSurg bed for observation.  Neurology, Dr. Doy Schmidt is consulted.  MRI of the brain  Acute inferior left cerebellar infarcts.  Punctate acute right cerebellar infarct.  Mild cerebral atrophy.  Mild chronic microvascular ischemic changes.   Review of Systems:   General: no fevers, chills, no body weight gain, has fatigue HEENT: no blurry vision, hearing changes or sore throat Respiratory: no dyspnea, coughing, wheezing CV: no chest pain, no palpitations GI: no nausea, vomiting, abdominal pain, diarrhea, constipation GU: no dysuria, burning on urination, increased  urinary frequency, hematuria  Ext: no leg edema Neuro: no unilateral weakness, numbness, or tingling, no vision change or hearing loss. Has dizziness Skin: no rash, no skin tear. MSK: No muscle spasm, no deformity, no limitation of range of movement in spin Heme: No easy bruising.  Travel history: No recent long distant travel.  Allergy:  Allergies  Allergen Reactions  . Amoxicillin Hives  . Clarithromycin Hives    Past Medical History:  Diagnosis Date  . Arthritis   . Diabetes mellitus without complication ([place])   . Hemorrhoids   . Hyperlipidemia   . Hypertension     Past Surgical History:  Procedure Laterality Date  . COLONOSCOPY WITH PROPOFOL N/A 10/21/2014   Procedure: COLONOSCOPY WITH PROPOFOL;  Surgeon: Josefine Class, MD;  Location: [facility] ENDOSCOPY;  Service: Endoscopy;  Laterality: N/A;    Social History:  reports that he has quit smoking. He has never used smokeless tobacco. He reports current alcohol use of about 1.0 standard drink of alcohol per week. He reports that he does not use drugs.  Family History:  Family History  Problem Relation Age of Onset  . Heart attack Mother   . Clotting disorder Father      Prior to Admission medications   Medication Sig Start Date End Date Taking? Authorizing Provider  amLODipine (NORVASC) 10 MG tablet Take 10 mg by mouth daily.   Yes [provider]  aspirin 81 MG EC tablet Take by mouth.   Yes [provider]  atenolol-chlorthalidone (TENORETIC) 50-25 MG per tablet Take 1 tablet by mouth daily.   Yes [provider]  fenofibrate 160 MG  tablet Take 160 mg by mouth daily.   Yes [provider]  ibuprofen (ADVIL) 200 MG tablet Take 200 mg by mouth every 6 (six) hours as needed.   Yes [provider]  metFORMIN (GLUCOPHAGE-XR) 500 MG 24 hr tablet Take 2 tablets by mouth once daily 11/29/19  Yes [provider]  potassium chloride SA (K-DUR,KLOR-CON) 20 MEQ tablet Take 20  mEq by mouth 2 (two) times daily.   Yes [provider]  pravastatin (PRAVACHOL) 20 MG tablet Take 20 mg by mouth daily.   Yes [provider]  sildenafil (VIAGRA) 100 MG tablet Take by mouth.  12/22/18  Yes [provider]    Physical Exam: Vitals:   11/30/19 1055 11/30/19 1252 11/30/19 1430 11/30/19 1644  BP:  140/77 (!) 141/69 (!) 143/77  Pulse: (!) 104 (!) 51 (!) 51 61  Resp: 20 (!) 21 11 (!) 21  Temp:      TempSrc:      SpO2: 100% 98% 95% 98%  Weight:      Height:       General: Not in acute distress HEENT:       Eyes: PERRL, EOMI, no scleral icterus.       ENT: No discharge from the ears and nose, no pharynx injection, no tonsillar enlargement.        Neck: No JVD, no bruit, no mass felt. Heme: No neck lymph node enlargement. Cardiac: S1/S2, RRR, No murmurs, No gallops or rubs. Respiratory:  No rales, wheezing, rhonchi or rubs. GI: Soft, nondistended, nontender, no rebound pain, no organomegaly, BS present. GU: No hematuria Ext: No pitting leg edema bilaterally. 1+DP/PT pulse bilaterally. Musculoskeletal: No joint deformities, No joint redness or warmth, no limitation of ROM in spin. Skin: No rashes.  Neuro: Alert, oriented X3, cranial nerves II-XII grossly intact, moves all extremities normally. Muscle strength 5/5 in all extremities, sensation to light touch intact. Brachial reflex 2+ bilaterally.  Psych: Patient is not psychotic, no suicidal or hemocidal ideation.  Labs on Admission: I have personally reviewed following labs and imaging studies  CBC: Recent Labs  Lab 11/30/19 1013  WBC 10.1  NEUTROABS 8.7*  HGB 13.3  HCT 38.3*  MCV 93.2  PLT 673   Basic Metabolic Panel: Recent Labs  Lab 11/30/19 1013  NA 142  K 2.5*  CL 113*  CO2 17*  GLUCOSE 161*  BUN 23  CREATININE 1.04  CALCIUM 7.4*   GFR: Estimated Creatinine Clearance: 84.6 mL/min (by C-G formula based on SCr of 1.04 mg/dL). Liver Function Tests: Recent Labs  Lab  11/30/19 1013  AST 20  ALT 20  ALKPHOS 24*  BILITOT 0.9  PROT 6.0*  ALBUMIN 3.4*   No results for input(s): LIPASE, AMYLASE in the last 168 hours. No results for input(s): AMMONIA in the last 168 hours. Coagulation Profile: No results for input(s): INR, PROTIME in the last 168 hours. Cardiac Enzymes: No results for input(s): CKTOTAL, CKMB, CKMBINDEX, TROPONINI in the last 168 hours. BNP (last 3 results) No results for input(s): PROBNP in the last 8760 hours. HbA1C: No results for input(s): HGBA1C in the last 72 hours. CBG: No results for input(s): GLUCAP in the last 168 hours. Lipid Profile: No results for input(s): CHOL, HDL, LDLCALC, TRIG, CHOLHDL, LDLDIRECT in the last 72 hours. Thyroid Function Tests: No results for input(s): TSH, T4TOTAL, FREET4, T3FREE, THYROIDAB in the last 72 hours. Anemia Panel: No results for input(s): VITAMINB12, FOLATE, FERRITIN, TIBC, IRON, RETICCTPCT in the last  72 hours. Urine analysis: No results found for: COLORURINE, APPEARANCEUR, LABSPEC, PHURINE, GLUCOSEU, HGBUR, BILIRUBINUR, KETONESUR, PROTEINUR, UROBILINOGEN, NITRITE, LEUKOCYTESUR Sepsis Labs: @LABRCNTIP (procalcitonin:4,lacticidven:4) ) Recent Results (from the past 240 hour(s))  SARS Coronavirus 2 by RT PCR (hospital order, performed in [facility] hospital lab) Nasopharyngeal Nasopharyngeal Swab     Status: None   Collection Time: 11/30/19 10:13 AM   Specimen: Nasopharyngeal Swab  Result Value Ref Range Status   SARS Coronavirus 2 NEGATIVE NEGATIVE Final    Comment: (NOTE) SARS-CoV-2 target nucleic acids are NOT DETECTED.  The SARS-CoV-2 RNA is generally detectable in upper and lower respiratory specimens during the acute phase of infection. The lowest concentration of SARS-CoV-2 viral copies this assay can detect is 250 copies / mL. A negative result does not preclude SARS-CoV-2 infection and should not be used as the sole basis for treatment or other patient management  decisions.  A negative result may occur with improper specimen collection / handling, submission of specimen other than nasopharyngeal swab, presence of viral mutation(s) within the areas targeted by this assay, and inadequate number of viral copies (<250 copies / mL). A negative result must be combined with clinical observations, patient history, and epidemiological information.  Fact Sheet for Patients:   StrictlyIdeas.no  Fact Sheet for Healthcare Providers: BankingDealers.co.za  This test is not yet approved or  cleared by the [place] FDA and has been authorized for detection and/or diagnosis of SARS-CoV-2 by FDA under an Emergency Use Authorization (EUA).  This EUA will remain in effect (meaning this test can be used) for the duration of the COVID-19 declaration under Section 564(b)(1) of the Act, 21 U.S.C. section 360bbb-3(b)(1), unless the authorization is terminated or revoked sooner.  Performed at [facility], [address] [place], [place] [address]      Radiological Exams on Admission: CT ANGIO HEAD W OR WO CONTRAST  Result Date: 11/30/2019 CLINICAL DATA:  Cerebellar infarcts EXAM: CT ANGIOGRAPHY HEAD AND NECK TECHNIQUE: Multidetector CT imaging of the head and neck was performed using the standard protocol during bolus administration of intravenous contrast. Multiplanar CT image reconstructions and MIPs were obtained to evaluate the vascular anatomy. Carotid stenosis measurements (when applicable) are obtained utilizing NASCET criteria, using the distal internal carotid diameter as the denominator. CONTRAST:  32mL OMNIPAQUE IOHEXOL 350 MG/ML SOLN COMPARISON:  None. FINDINGS: CTA NECK Aortic arch: Great vessel origins are patent. Right carotid system: Patent. Mild calcified plaque along the common carotid. Mild calcified plaque at the ICA origin without measurable stenosis. Left carotid system: Patent. Mild  calcified and noncalcified plaque at the ICA origin causing minimal stenosis. Mild plaque along the distal cervical ICA. Vertebral arteries: Right vertebral artery is patent. Proximal left vertebral arteries patent with calcified plaque at the origin likely causing mild stenosis. There is loss of enhancement at the level of the C2 transverse foramen without reconstitution in the neck. Skeleton: Multilevel degenerative changes of the cervical spine. Other neck: No mass or adenopathy. Upper chest: No apical lung mass. Review of the MIP images confirms the above findings CTA HEAD Anterior circulation: Intracranial internal carotid arteries are patent with calcified plaque causing mild to moderate stenosis. Anterior cerebral arteries are patent. Anterior communicating artery is present. Middle cerebral arteries are patent. Posterior circulation: Intracranial right vertebral artery and PICA origins are patent. There is minimal reconstitution of the left vertebral artery distally adjacent to the basilar artery origin. Left PICA origin appears occluded. Basilar artery is patent with moderate to marked stenosis with  mid segment. Small caliber AICAs are present. Superior cerebral artery origins are patent. Posterior cerebral arteries are patent. Venous sinuses: Patent as allowed by contrast bolus timing. 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 Left PICA origin appears occluded. 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 No hemodynamically significant stenosis at the ICA origins. Electronically Signed   By: Macy Mis M.D.   On: 11/30/2019 16:55   CT Head Wo Contrast  Result Date: 11/30/2019 CLINICAL DATA:  Ataxia, stroke suspected. Additional provided: Weakness and dizziness, vomiting EXAM: CT HEAD WITHOUT CONTRAST TECHNIQUE: Contiguous axial  images were obtained from the base of the skull through the vertex without intravenous contrast. COMPARISON:  No pertinent prior studies available for comparison. FINDINGS: Brain: Mild generalized parenchymal atrophy. Mild ill-defined hypoattenuation within the cerebral white matter is nonspecific, but consistent chronic small vessel ischemic disease. There is no acute intracranial hemorrhage. No demarcated cortical infarct. No extra-axial fluid collection. No evidence of intracranial mass. No midline shift. Vascular: No hyperdense vessel.  Atherosclerotic calcifications. Skull: Normal. Negative for fracture or focal lesion. Sinuses/Orbits: Visualized orbits show no acute finding. Mild ethmoid sinus mucosal thickening. No significant mastoid effusion. IMPRESSION: No CT evidence of acute intracranial abnormality. Mild generalized parenchymal atrophy and chronic small vessel ischemic disease. Mild ethmoid sinus mucosal thickening. Electronically Signed   By: Kellie Simmering DO   On: 11/30/2019 10:54   CT ANGIO NECK W OR WO CONTRAST  Result Date: 11/30/2019 CLINICAL DATA:  Cerebellar infarcts EXAM: CT ANGIOGRAPHY HEAD AND NECK TECHNIQUE: Multidetector CT imaging of the head and neck was performed using the standard protocol during bolus administration of intravenous contrast. Multiplanar CT image reconstructions and MIPs were obtained to evaluate the vascular anatomy. Carotid stenosis measurements (when applicable) are obtained utilizing NASCET criteria, using the distal internal carotid diameter as the denominator. CONTRAST:  40mL OMNIPAQUE IOHEXOL 350 MG/ML SOLN COMPARISON:  None. FINDINGS: CTA NECK Aortic arch: Great vessel origins are patent. Right carotid system: Patent. Mild calcified plaque along the common carotid. Mild calcified plaque at the ICA origin without measurable stenosis. Left carotid system: Patent. Mild calcified and noncalcified plaque at the ICA origin causing minimal stenosis. Mild plaque along  the distal cervical ICA. Vertebral arteries: Right vertebral artery is patent. Proximal left vertebral arteries patent with calcified plaque at the origin likely causing mild stenosis. There is loss of enhancement at the level of the C2 transverse foramen without reconstitution in the neck. Skeleton: Multilevel degenerative changes of the cervical spine. Other neck: No mass or adenopathy. Upper chest: No apical lung mass. Review of the MIP images confirms the above findings CTA HEAD Anterior circulation: Intracranial internal carotid arteries are patent with calcified plaque causing mild to moderate stenosis. Anterior cerebral arteries are patent. Anterior communicating artery is present. Middle cerebral arteries are patent. Posterior circulation: Intracranial right vertebral artery and PICA origins are patent. There is minimal reconstitution of the left vertebral artery distally adjacent to the basilar artery origin. Left PICA origin appears occluded. Basilar artery is patent with moderate to marked stenosis with mid segment. Small caliber AICAs are present. Superior cerebral artery origins are patent. Posterior cerebral arteries are patent. Venous sinuses: Patent as allowed by contrast bolus timing. 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 Left PICA origin appears occluded. 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 No hemodynamically significant stenosis at the ICA origins. Electronically Signed   By: Macy Mis M.D.   On: 11/30/2019 16:55   MR BRAIN WO CONTRAST  Result Date: 11/30/2019 CLINICAL DATA:  Ataxia, suspected stroke. EXAM: MRI HEAD WITHOUT CONTRAST TECHNIQUE: Multiplanar, multiecho pulse sequences of the brain and surrounding structures were obtained without intravenous contrast. COMPARISON:  11/30/2019 head CT.  FINDINGS: Brain: Mild diffuse parenchymal atrophy with ex vacuo dilatation. Multifocal restricted diffusion with correlated of T2/FLAIR hyperintensity involving the inferior left cerebellum reflects acute infarcts. Punctate foci of restricted diffusion involving the right cerebellum also reflect acute infarcts (5:14). No intracranial hemorrhage. Background scattered T2/FLAIR hyperintense foci involving the periventricular and subcortical white matter reflect chronic microvascular ischemic changes. No midline shift or extra-axial fluid collection. No mass lesion. Vascular: Normal flow voids. Skull and upper cervical spine: Normal marrow signal. Sinuses/Orbits: Normal orbits. Clear paranasal sinuses. No mastoid effusion. Other: None. IMPRESSION: Acute inferior left cerebellar infarcts. Punctate acute right cerebellar infarct. Mild cerebral atrophy.  Mild chronic microvascular ischemic changes. These results were called by telephone at the time of interpretation on 11/30/2019 at 2:17 Pm to provider Dr. Ivor Costa , who verbally acknowledged these results. Electronically Signed   By: Primitivo Gauze M.D.   On: 11/30/2019 14:18     EKG: Independently reviewed.  Sinus rhythm, QTC 462, LAD, low voltage, poor R wave progression, PAC bradycardia  Assessment/Plan Principal Problem:   Cerebellar stroke, acute (HCC) Active Problems:   Hypertension   Diabetes mellitus without complication (HCC)   Hyperlipidemia   Elevated troponin   Hypokalemia   Cerebellar stroke (HCC)   Cerebellar stroke, acute ([place]): Dr. Doy Schmidt of neurology is consulted  - Placed on MedSurg bed for observation - will follow up Neurology's Recs.  - Obtain CTA of head and neck - will hold oral Bp meds to allow permissive HTN in the setting of acute stroke  - ASA, pravastatin, fenofibrate - fasting lipid panel and HbA1c  - 2D transthoracic echocardiography  - swallowing screen. If fails, will get SLP - PT/OT consult - prn  meclizine for dizziness   Hypertension - will hold oral Bp meds to allow permissive HTN in the setting of acute stroke  -will continue atenolol due to tachycardia -IV hydralazine as needed for SBP>220 or dBP>120  Diabetes mellitus without complication ([place]): Recent A1c 6.6 on 06/18/2019, well controlled.  Patient is taking Metformin -SSI  Hyperlipidemia -Fenofibrate and pravastatin  Elevated troponin: Troponin 85, 1033.  No chest pain.  Most likely due to demand ischemia secondary to stroke -Trend troponin, follow-up A1c, FLP -Repeat EKG in morning Follow-up 2D echo as above  Hypokalemia: K= 2.5  on admission. - Repleted - Check Mg level - Give 1 g of magnesium sulfate   DVT ppx: SQ Lovenox Code Status: Full code Family Communication:   Yes, patient's  Daughter by phone Disposition Plan:  Anticipate discharge back to previous environment Consults called:  Dr. Doy Schmidt of neurology Admission status: Med-surg bed for obs   Status is: Observation  The patient remains OBS appropriate and will d/c before 2 midnights.  Dispo: The patient is from: Home              Anticipated d/c is to: Home              Anticipated d/c date is: 1 day  Patient currently is not medically stable to d/c.           Date of Service 11/30/2019    Ivor Costa [facility]   If 7PM-7AM, please contact night-coverage [email or web address] 11/30/2019, 5:04 PM

## 2019-11-30 NOTE — ED Notes (Signed)
Md Blaine Hamper made aware pt passed swallow screen.

## 2019-11-30 NOTE — ED Notes (Signed)
Pt in MRI at this time 

## 2019-12-01 ENCOUNTER — Observation Stay (HOSPITAL_BASED_OUTPATIENT_CLINIC_OR_DEPARTMENT_OTHER)
Admit: 2019-12-01 | Discharge: 2019-12-01 | Disposition: A | Discharge status: Home / Self Care | Payer: Medicare Other | Attending: Internal Medicine | Admitting: Internal Medicine

## 2019-12-01 DIAGNOSIS — I639 Cerebral infarction, unspecified: Secondary | ICD-10-CM

## 2019-12-01 DIAGNOSIS — I6389 Other cerebral infarction: Secondary | ICD-10-CM

## 2019-12-01 DIAGNOSIS — E119 Type 2 diabetes mellitus without complications: Secondary | ICD-10-CM | POA: Diagnosis not present

## 2019-12-01 LAB — ECHOCARDIOGRAM COMPLETE
AR max vel: 2.84 cm2
AV Area VTI: 2.93 cm2
AV Area mean vel: 3.04 cm2
AV Mean grad: 4 mmHg
AV Peak grad: 7.7 mmHg
Ao pk vel: 1.39 m/s
Area-P 1/2: 4.86 cm2
Height: 69 in
S' Lateral: 3.37 cm
Weight: 4240 oz

## 2019-12-01 LAB — HIV ANTIBODY (ROUTINE TESTING W REFLEX): HIV Screen 4th Generation wRfx: NONREACTIVE

## 2019-12-01 LAB — GLUCOSE, CAPILLARY
Glucose-Capillary: 148 mg/dL — ABNORMAL HIGH (ref 70–99)
Glucose-Capillary: 130 mg/dL — ABNORMAL HIGH (ref 70–99)
Glucose-Capillary: 136 mg/dL — ABNORMAL HIGH (ref 70–99)

## 2019-12-01 LAB — LIPID PANEL
Cholesterol: 170 mg/dL (ref 0–200)
HDL: 26 mg/dL — ABNORMAL LOW (ref >40)
LDL Cholesterol: 77 mg/dL (ref 0–99)
Total CHOL/HDL Ratio: 6.5 RATIO
Triglycerides: 337 mg/dL — ABNORMAL HIGH (ref <150)
VLDL: 67 mg/dL — ABNORMAL HIGH (ref 0–40)

## 2019-12-01 LAB — HEMOGLOBIN A1C
Hgb A1c MFr Bld: 6.2 % — ABNORMAL HIGH (ref 4.8–5.6)
Mean Plasma Glucose: 131.24 mg/dL

## 2019-12-01 LAB — MAGNESIUM: Magnesium: 1.9 mg/dL (ref 1.7–2.4)

## 2019-12-01 MED ORDER — PERFLUTREN LIPID MICROSPHERE
1.0000 mL | INTRAVENOUS | Status: AC | PRN
  Administered 2019-12-01: 2 mL via INTRAVENOUS
  Filled 2019-12-01: qty 10

## 2019-12-01 MED ORDER — CLOPIDOGREL BISULFATE 75 MG PO TABS
75.0000 mg | ORAL_TABLET | Freq: Every day | ORAL | 0 refills | Status: DC
Start: 2019-12-01 — End: 2021-07-20

## 2019-12-01 MED ORDER — ATORVASTATIN CALCIUM 40 MG PO TABS
40.0000 mg | ORAL_TABLET | Freq: Every day | ORAL | 11 refills | Status: AC
Start: 2019-12-01 — End: 2023-05-22

## 2019-12-01 NOTE — Evaluation (Signed)
Physical Therapy Evaluation Patient Details Name: George Schmidt MRN: [id number] DOB: December 19, 1950 Today's Date: 12/01/2019   History of Present Illness  Pt is a 69 y.o. male presenting to hospital 7/20 with dizziness (room spinning), L sided HA, weakness, N/V, and decreased balance.  Pt noted with hypokalemia and elevated troponin most likely secondary demand ischemia from stroke.  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  PMH includes arthritis, DM, and htn.  Clinical Impression  Prior to hospital admission, pt was independent; lives with his wife and mother in law.  Currently pt is modified independent with bed mobility; modified independent with transfers; CGA to min assist to ambulate with no UE support (occasional lateral loss of balance noted requiring assist for balance; pt reporting d/t not having knee braces here--wears when he has knee pain); CGA to ambulate with RW.  L knee soreness noted but improved/almost resolved with ambulation using RW.  Pt reporting balance was off d/t not having knee braces here although pt's wife reports pt's equilibrium was off (pt was unsteady walking) when pt came to hospital.  Pt would benefit from skilled PT to address noted impairments and functional limitations (see below for any additional details).  Upon hospital discharge, pt would benefit from [place].    Follow Up Recommendations Home health PT    Equipment Recommendations  Rolling walker with 5" wheels    Recommendations for Other Services OT consult     Precautions / Restrictions Precautions Precautions: Fall Precaution Comments: High Fall Restrictions Weight Bearing Restrictions: No      Mobility  Bed Mobility Overal bed mobility: Modified Independent Bed Mobility: Supine to Sit;Sit to Supine     Supine to sit: Modified independent (Device/Increase time);HOB elevated Sit to supine: Modified independent (Device/Increase time);HOB  elevated      Transfers Overall transfer level: Modified independent Equipment used: Rolling walker (2 wheeled)                Ambulation/Gait Ambulation/Gait assistance: Min guard;Min assist Gait Distance (Feet):  (70 feet with no AD; 50 feet with RW) Assistive device: None;Rolling walker (2 wheeled) Gait Pattern/deviations: Step-through pattern Gait velocity: decreased   General Gait Details: pt with occasional loss of balance laterally ambulating without UE support requiring min assist to steady; improved balance and safety noted ambulating with RW (CGA)  Stairs            Wheelchair Mobility    Modified Rankin (Stroke Patients Only)       Balance Overall balance assessment: Needs assistance Sitting-balance support: No upper extremity supported;Feet supported Sitting balance-Leahy Scale: Normal Sitting balance - Comments: steady sitting reaching outside BOS   Standing balance support: No upper extremity supported Standing balance-Leahy Scale: Poor Standing balance comment: pt with occasional lateral loss of balance requiring min assist to steady (with no UE support)                             Pertinent Vitals/Pain Pain Assessment: No/denies pain  Vitals (HR and O2 on room air) stable and WFL throughout treatment session.    Home Living Family/patient expects to be discharged to:: Private residence Living Arrangements: Spouse/significant other;Other relatives (pt's wife and mother in law) Available Help at Discharge: Family Type of Home: Mobile home Home Access: Ramped entrance     Home Layout: One level Home Equipment: Shower seat - built in;Grab bars - tub/shower;Hand held shower head  Prior Function Level of Independence: Independent         Comments: Helps care for his 59 y.o. mother in law with Parkinsons.     Hand Dominance        Extremity/Trunk Assessment   Upper Extremity Assessment Upper Extremity  Assessment: Defer to OT evaluation    Lower Extremity Assessment Lower Extremity Assessment:  (4+/5 B hip flexion, knee flexion/extension, and DF/PF; intact B LE heel to shin coordination, proprioception, tone, and light touch)    Cervical / Trunk Assessment Cervical / Trunk Assessment: Normal  Communication   Communication: No difficulties  Cognition Arousal/Alertness: Awake/alert Behavior During Therapy: WFL for tasks assessed/performed Overall Cognitive Status: Within Functional Limits for tasks assessed                                 General Comments: Pleasant and cooperative      General Comments General comments (skin integrity, edema, etc.): Pt reporting no dizziness or adverse symptoms during session.  Nursing cleared pt for participation in physical therapy.  Pt agreeable to PT session.  Pt's wife arrived mid session.    Exercises Gait training with RW   Assessment/Plan    PT Assessment Patient needs continued PT services  PT Problem List Decreased strength;Decreased balance;Decreased mobility;Decreased knowledge of use of DME;Decreased safety awareness;Decreased knowledge of precautions       PT Treatment Interventions DME instruction;Gait training;Functional mobility training;Therapeutic activities;Therapeutic exercise;Balance training;Patient/family education    PT Goals (Current goals can be found in the Care Plan section)  Acute Rehab PT Goals Patient Stated Goal: to go home PT Goal Formulation: With patient Time For Goal Achievement: 12/15/19 Potential to Achieve Goals: Good    Frequency 7X/week   Barriers to discharge        Co-evaluation               AM-PAC PT "6 Clicks" Mobility  Outcome Measure Help needed turning from your back to your side while in a flat bed without using bedrails?: None Help needed moving from lying on your back to sitting on the side of a flat bed without using bedrails?: None Help needed moving to and  from a bed to a chair (including a wheelchair)?: A Little Help needed standing up from a chair using your arms (e.g., wheelchair or bedside chair)?: A Little Help needed to walk in hospital room?: A Little Help needed climbing 3-5 steps with a railing? : A Little 6 Click Score: 20    End of Session Equipment Utilized During Treatment: Gait belt Activity Tolerance: Patient tolerated treatment well Patient left: in bed;with call bell/phone within reach;with bed alarm set;with family/visitor present Nurse Communication: Mobility status;Precautions PT Visit Diagnosis: Unsteadiness on feet (R26.81);Muscle weakness (generalized) (M62.81);Other abnormalities of gait and mobility (R26.89)    Time: 2706-2376 PT Time Calculation (min) (ACUTE ONLY): 33 min   Charges:   PT Evaluation $PT Eval Low Complexity: 1 Low PT Treatments $Gait Training: 8-22 mins       Leitha Bleak, PT 12/01/19, 2:31 PM

## 2019-12-01 NOTE — Evaluation (Signed)
Occupational Therapy Evaluation Patient Details Name: George Schmidt MRN: [id number] DOB: [birth year] Today's Date: 12/01/2019    History of Present Illness George Schmidt is a 69 y.o. male with medical history significant of hypertension, hyperlipidemia, diabetes mellitus, who presents with dizziness. MRI showed Acute inferior left cerebellar infarcts, punctate acute right cerebellar infarct, & Mild chronic microvascular ischemic changes.   Clinical Impression   George Schmidt was seen for OT evaluation this date. Prior to hospital admission, pt was active and independent in all aspects of ADL/IADL, he reports working in his garden and mowing his lawn regularly. He denies falls history in past 12 months. Pt lives with his spouse, and MIL in a 1 story home with a ramped entrancel. Currently pt reporting symptoms have generally resolved. He reports using a RW for safety when getting up to his room commode, but otherwise is at baseline for toileting, dressing, etc. Pt educated on falls prevention strategies, safe use of AE/DME for ADL management, routines modifications, and BE FAST stroke symptom recognition and response. Pt demonstrates baseline independence to perform ADL and mobility tasks and no strength, sensory, coordination, cognitive, or visual deficits appreciated with assessment. All education/treatment performed at time of OT evaluation, no further skilled OT needs identified. Will sign off at this time. Please re-consult if additional OT needs arise during this admission.      Follow Up Recommendations  No OT follow up    Equipment Recommendations  None recommended by OT    Recommendations for Other Services       Precautions / Restrictions Precautions Precautions: Fall Precaution Comments: High Fall Restrictions Weight Bearing Restrictions: No      Mobility Bed Mobility Overal bed mobility: Needs Assistance Bed Mobility: Supine to Sit;Sit to Supine     Supine to sit:  Modified independent (Device/Increase time);HOB elevated Sit to supine: Modified independent (Device/Increase time);HOB elevated      Transfers Overall transfer level: Modified independent Equipment used: Rolling walker (2 wheeled)                  Balance Overall balance assessment: Mild deficits observed, not formally tested                                         ADL either performed or assessed with clinical judgement   ADL Overall ADL's : At baseline                                       General ADL Comments: Pt presents at or near baseline level of functional independence. He is up in room using RW "just incase I feel dizzy". Toileting independently, and denies functional deficits for dressing, bathing, self feeding, etc.     Vision Baseline Vision/History: Wears glasses Wears Glasses: At all times Patient Visual Report: No change from baseline       Perception     Praxis      Pertinent Vitals/Pain Pain Assessment: No/denies pain     Hand Dominance     Extremity/Trunk Assessment Upper Extremity Assessment Upper Extremity Assessment: Overall WFL for tasks assessed   Lower Extremity Assessment Lower Extremity Assessment: Overall WFL for tasks assessed   Cervical / Trunk Assessment Cervical / Trunk Assessment: Normal   Communication Communication Communication: No difficulties   Cognition Arousal/Alertness: Awake/alert Behavior  During Therapy: WFL for tasks assessed/performed Overall Cognitive Status: Within Functional Limits for tasks assessed                                 General Comments: Pt pleasant, conversational, fully oriented t/o session.   General Comments  Pt endorses intermittent "dizzy spells" but overall states he is feeling much better since yesterday. No dizziness or adverse symptoms reported during OT evaluation.    Exercises Other Exercises Other Exercises: Pt educated on role  of OT in acute setting, falls prevention strategies, safe transfer techniques, routines modifications to support safety and functional independence upon hospital DC, and BE FAST stroke education.   Shoulder Instructions      Home Living Family/patient expects to be discharged to:: Private residence Living Arrangements: Spouse/significant other;Other relatives Available Help at Discharge: Family Type of Home: Mobile home Home Access: Ramped entrance     Home Layout: One level     Bathroom Shower/Tub: Walk-in shower;Door   [employer] Toilet: Standard     Home Equipment: Shower seat - built in;Grab bars - tub/shower;Hand held shower head          Prior Functioning/Environment Level of Independence: Independent        Comments: Pt active and independent PTA. He endorses doing yardwork and helping to care for his "60 y.o. mother in law with Parkinsons". He enjoys working on his '[employer] in his free time.        OT Problem List: Decreased safety awareness;Impaired balance (sitting and/or standing);Decreased knowledge of use of DME or AE      OT Treatment/Interventions:      OT Goals(Current goals can be found in the care plan section) Acute Rehab OT Goals Patient Stated Goal: To go home and mow my lawn. OT Goal Formulation: All assessment and education complete, DC therapy Time For Goal Achievement: 12/01/19 Potential to Achieve Goals: Good  OT Frequency:     Barriers to D/C:            Co-evaluation              AM-PAC OT "6 Clicks" Daily Activity     Outcome Measure Help from another person eating meals?: None Help from another person taking care of personal grooming?: None Help from another person toileting, which includes using toliet, bedpan, or urinal?: A Little Help from another person bathing (including washing, rinsing, drying)?: A Little Help from another person to put on and taking off regular upper body clothing?: None Help from another person to  put on and taking off regular lower body clothing?: None 6 Click Score: 22   End of Session Equipment Utilized During Treatment: Rolling walker Nurse Communication: Other (comment) (Pt tele leads noted to be off, monitor beeping, reporting asystole with pt seated upright talking, doing well. NT and RN notified.)  Activity Tolerance: Patient tolerated treatment well Patient left: in bed;with call bell/phone within reach;with bed alarm set  OT Visit Diagnosis: Other symptoms and signs involving the nervous system (R29.898)                Time: 1937-9024 OT Time Calculation (min): 27 min Charges:  OT General Charges $OT Visit: 1 Visit OT Evaluation $OT Eval Low Complexity: 1 Low OT Treatments $Self Care/Home Management : 8-22 mins  Shara Blazing, M.S., OTR/L Ascom: [phone] 12/01/19, 1:31 PM

## 2019-12-01 NOTE — Progress Notes (Signed)
*  PRELIMINARY RESULTS* Echocardiogram 2D Echocardiogram has been performed.  Wallie Char Tina Temme 12/01/2019, 9:09 AM

## 2019-12-01 NOTE — Progress Notes (Signed)
SLP Cancellation Note  Patient Details Name: George Schmidt MRN: [id number] DOB: October 25, 1950   Cancelled treatment:       Reason Eval/Treat Not Completed: SLP screened, no needs identified, will sign off (chart reviewed; consulted NSG/MD then met w/ pt in room). Pt denied any difficulty swallowing and is currently on a regular diet; tolerates swallowing pills w/ water per NSG. Drinking liquids/ice while in room w/ pt. Pt conversed in conversation w/ family on phone and w/ MD/SLP w/out gross deficits noted; pt and family stated speech "has improved to myself". No other language deficits observed during conversation w/ family on phone - appropriate engagement noted.  No further skilled ST services indicated as pt appears at his baseline. Pt agreed. NSG to reconsult if any change in status while admitted.     Orinda Kenner, MS, CCC-SLP [name] 12/01/2019, 10:38 AM

## 2019-12-01 NOTE — Progress Notes (Signed)
Subjective: No new neurological complaints.  Objective: Current vital signs: BP 116/67 (BP Location: Right Arm)   Pulse (!) 55   Temp 99.7 F (37.6 C) (Oral)   Resp (!) 22   Ht 5\' 9"  (1.753 m)   Wt 120.2 kg   SpO2 97%   BMI 39.13 kg/m  Vital signs in last 24 hours: Temp:  [97.5 F (36.4 C)-99.7 F (37.6 C)] 99.7 F (37.6 C) (07/21 0811) Pulse Rate:  [51-71] 55 (07/21 0811) Resp:  [11-22] 22 (07/21 0811) BP: (111-143)/(67-105) 116/67 (07/21 0811) SpO2:  [95 %-98 %] 97 % (07/21 0811)  Intake/Output from previous day: 07/20 0701 - 07/21 0700 In: 240 [P.O.:240] Out: -  Intake/Output this shift: Total I/O In: 480 [P.O.:480] Out: -  Nutritional status:  Diet Order            Diet heart healthy/carb modified Room service appropriate? Yes; Fluid consistency: Thin  Diet effective now                 Neurologic Exam: Mental Status: Alert, oriented, thought content appropriate.  Speech fluent without evidence of aphasia.  Able to follow 3 step commands without difficulty. Cranial Nerves: II: Visual fields grossly normal, pupils equal, round, reactive to light and accommodation III,IV, VI: left ptosis, extra-ocular motions intact bilaterally V,VII: mild decrease in the left NLF, facial light touch sensation normal bilaterally VIII: hearing normal bilaterally IX,X: gag reflex present XI: bilateral shoulder shrug XII: midline tongue extension Motor: Right :  Upper extremity   5/5                                      Left:     Upper extremity   5/5             Lower extremity   5/5                                                  Lower extremity   5/5 Tone and bulk:normal tone throughout; no atrophy noted Sensory: Pinprick and light touch intact throughout, bilaterally Deep Tendon Reflexes: Symmetric throughout Plantars: Right: mute                              Left: mute Cerebellar: Improved dysmetria with left finger-to-nose testing   Lab Results: Basic Metabolic  Panel: Recent Labs  Lab 11/30/19 1013 11/30/19 2257 12/01/19 0507  NA 142  --   --   K 2.5* 3.7  --   CL 113*  --   --   CO2 17*  --   --   GLUCOSE 161*  --   --   BUN 23  --   --   CREATININE 1.04  --   --   CALCIUM 7.4*  --   --   MG  --   --  1.9    Liver Function Tests: Recent Labs  Lab 11/30/19 1013  AST 20  ALT 20  ALKPHOS 24*  BILITOT 0.9  PROT 6.0*  ALBUMIN 3.4*   No results for input(s): LIPASE, AMYLASE in the last 168 hours. No results for input(s): AMMONIA in the last 168 hours.  CBC: Recent Labs  Lab  11/30/19 1013  WBC 10.1  NEUTROABS 8.7*  HGB 13.3  HCT 38.3*  MCV 93.2  PLT 203    Cardiac Enzymes: No results for input(s): CKTOTAL, CKMB, CKMBINDEX, TROPONINI in the last 168 hours.  Lipid Panel: Recent Labs  Lab 12/01/19 0507  CHOL 170  TRIG 337*  HDL 26*  CHOLHDL 6.5  VLDL 67*  LDLCALC 77    CBG: Recent Labs  Lab 11/30/19 1811 11/30/19 2049 12/01/19 0757  GLUCAP 144* 155* 148*    Microbiology: Results for orders placed or performed during the hospital encounter of 11/30/19  SARS Coronavirus 2 by RT PCR (hospital order, performed in [facility] hospital lab) Nasopharyngeal Nasopharyngeal Swab     Status: None   Collection Time: 11/30/19 10:13 AM   Specimen: Nasopharyngeal Swab  Result Value Ref Range Status   SARS Coronavirus 2 NEGATIVE NEGATIVE Final    Comment: (NOTE) SARS-CoV-2 target nucleic acids are NOT DETECTED.  The SARS-CoV-2 RNA is generally detectable in upper and lower respiratory specimens during the acute phase of infection. The lowest concentration of SARS-CoV-2 viral copies this assay can detect is 250 copies / mL. A negative result does not preclude SARS-CoV-2 infection and should not be used as the sole basis for treatment or other patient management decisions.  A negative result may occur with improper specimen collection / handling, submission of specimen other than nasopharyngeal swab, presence of  viral mutation(s) within the areas targeted by this assay, and inadequate number of viral copies (<250 copies / mL). A negative result must be combined with clinical observations, patient history, and epidemiological information.  Fact Sheet for Patients:   StrictlyIdeas.no  Fact Sheet for Healthcare Providers: BankingDealers.co.za  This test is not yet approved or  cleared by the [place] FDA and has been authorized for detection and/or diagnosis of SARS-CoV-2 by FDA under an Emergency Use Authorization (EUA).  This EUA will remain in effect (meaning this test can be used) for the duration of the COVID-19 declaration under Section 564(b)(1) of the Act, 21 U.S.C. section 360bbb-3(b)(1), unless the authorization is terminated or revoked sooner.  Performed at [facility], [address] [place], [place] [address]     Coagulation Studies: No results for input(s): LABPROT, INR in the last 72 hours.  Imaging: CT ANGIO HEAD W OR WO CONTRAST  Result Date: 11/30/2019 CLINICAL DATA:  Cerebellar infarcts EXAM: CT ANGIOGRAPHY HEAD AND NECK TECHNIQUE: Multidetector CT imaging of the head and neck was performed using the standard protocol during bolus administration of intravenous contrast. Multiplanar CT image reconstructions and MIPs were obtained to evaluate the vascular anatomy. Carotid stenosis measurements (when applicable) are obtained utilizing NASCET criteria, using the distal internal carotid diameter as the denominator. CONTRAST:  69mL OMNIPAQUE IOHEXOL 350 MG/ML SOLN COMPARISON:  None. FINDINGS: CTA NECK Aortic arch: Great vessel origins are patent. Right carotid system: Patent. Mild calcified plaque along the common carotid. Mild calcified plaque at the ICA origin without measurable stenosis. Left carotid system: Patent. Mild calcified and noncalcified plaque at the ICA origin causing minimal stenosis. Mild plaque along the  distal cervical ICA. Vertebral arteries: Right vertebral artery is patent. Proximal left vertebral arteries patent with calcified plaque at the origin likely causing mild stenosis. There is loss of enhancement at the level of the C2 transverse foramen without reconstitution in the neck. Skeleton: Multilevel degenerative changes of the cervical spine. Other neck: No mass or adenopathy. Upper chest: No apical lung mass. Review of the MIP images  confirms the above findings CTA HEAD Anterior circulation: Intracranial internal carotid arteries are patent with calcified plaque causing mild to moderate stenosis. Anterior cerebral arteries are patent. Anterior communicating artery is present. Middle cerebral arteries are patent. Posterior circulation: Intracranial right vertebral artery and PICA origins are patent. There is minimal reconstitution of the left vertebral artery distally adjacent to the basilar artery origin. Left PICA origin appears occluded. Basilar artery is patent with moderate to marked stenosis with mid segment. Small caliber AICAs are present. Superior cerebral artery origins are patent. Posterior cerebral arteries are patent. Venous sinuses: Patent as allowed by contrast bolus timing. 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 Left PICA origin appears occluded. 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 No hemodynamically significant stenosis at the ICA origins. Electronically Signed   By: Macy Mis M.D.   On: 11/30/2019 16:55   CT Head Wo Contrast  Result Date: 11/30/2019 CLINICAL DATA:  Ataxia, stroke suspected. Additional provided: Weakness and dizziness, vomiting EXAM: CT HEAD WITHOUT CONTRAST TECHNIQUE: Contiguous axial images were obtained from the base of the skull through the vertex without intravenous contrast.  COMPARISON:  No pertinent prior studies available for comparison. FINDINGS: Brain: Mild generalized parenchymal atrophy. Mild ill-defined hypoattenuation within the cerebral white matter is nonspecific, but consistent chronic small vessel ischemic disease. There is no acute intracranial hemorrhage. No demarcated cortical infarct. No extra-axial fluid collection. No evidence of intracranial mass. No midline shift. Vascular: No hyperdense vessel.  Atherosclerotic calcifications. Skull: Normal. Negative for fracture or focal lesion. Sinuses/Orbits: Visualized orbits show no acute finding. Mild ethmoid sinus mucosal thickening. No significant mastoid effusion. IMPRESSION: No CT evidence of acute intracranial abnormality. Mild generalized parenchymal atrophy and chronic small vessel ischemic disease. Mild ethmoid sinus mucosal thickening. Electronically Signed   By: Kellie Simmering DO   On: 11/30/2019 10:54   CT ANGIO NECK W OR WO CONTRAST  Result Date: 11/30/2019 CLINICAL DATA:  Cerebellar infarcts EXAM: CT ANGIOGRAPHY HEAD AND NECK TECHNIQUE: Multidetector CT imaging of the head and neck was performed using the standard protocol during bolus administration of intravenous contrast. Multiplanar CT image reconstructions and MIPs were obtained to evaluate the vascular anatomy. Carotid stenosis measurements (when applicable) are obtained utilizing NASCET criteria, using the distal internal carotid diameter as the denominator. CONTRAST:  72mL OMNIPAQUE IOHEXOL 350 MG/ML SOLN COMPARISON:  None. FINDINGS: CTA NECK Aortic arch: Great vessel origins are patent. Right carotid system: Patent. Mild calcified plaque along the common carotid. Mild calcified plaque at the ICA origin without measurable stenosis. Left carotid system: Patent. Mild calcified and noncalcified plaque at the ICA origin causing minimal stenosis. Mild plaque along the distal cervical ICA. Vertebral arteries: Right vertebral artery is patent. Proximal left  vertebral arteries patent with calcified plaque at the origin likely causing mild stenosis. There is loss of enhancement at the level of the C2 transverse foramen without reconstitution in the neck. Skeleton: Multilevel degenerative changes of the cervical spine. Other neck: No mass or adenopathy. Upper chest: No apical lung mass. Review of the MIP images confirms the above findings CTA HEAD Anterior circulation: Intracranial internal carotid arteries are patent with calcified plaque causing mild to moderate stenosis. Anterior cerebral arteries are patent. Anterior communicating artery is present. Middle cerebral arteries are patent. Posterior circulation: Intracranial right vertebral artery and PICA origins are patent. There is minimal reconstitution  of the left vertebral artery distally adjacent to the basilar artery origin. Left PICA origin appears occluded. Basilar artery is patent with moderate to marked stenosis with mid segment. Small caliber AICAs are present. Superior cerebral artery origins are patent. Posterior cerebral arteries are patent. Venous sinuses: Patent as allowed by contrast bolus timing. 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 Left PICA origin appears occluded. 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 No hemodynamically significant stenosis at the ICA origins. Electronically Signed   By: Macy Mis M.D.   On: 11/30/2019 16:55   MR BRAIN WO CONTRAST  Result Date: 11/30/2019 CLINICAL DATA:  Ataxia, suspected stroke. EXAM: MRI HEAD WITHOUT CONTRAST TECHNIQUE: Multiplanar, multiecho pulse sequences of the brain and surrounding structures were obtained without intravenous contrast. COMPARISON:  11/30/2019 head CT. FINDINGS: Brain: Mild diffuse parenchymal atrophy with ex vacuo dilatation. Multifocal restricted  diffusion with correlated of T2/FLAIR hyperintensity involving the inferior left cerebellum reflects acute infarcts. Punctate foci of restricted diffusion involving the right cerebellum also reflect acute infarcts (5:14). No intracranial hemorrhage. Background scattered T2/FLAIR hyperintense foci involving the periventricular and subcortical white matter reflect chronic microvascular ischemic changes. No midline shift or extra-axial fluid collection. No mass lesion. Vascular: Normal flow voids. Skull and upper cervical spine: Normal marrow signal. Sinuses/Orbits: Normal orbits. Clear paranasal sinuses. No mastoid effusion. Other: None. IMPRESSION: Acute inferior left cerebellar infarcts. Punctate acute right cerebellar infarct. Mild cerebral atrophy.  Mild chronic microvascular ischemic changes. These results were called by telephone at the time of interpretation on 11/30/2019 at 2:17 Pm to provider Dr. Ivor Costa , who verbally acknowledged these results. Electronically Signed   By: Primitivo Gauze M.D.   On: 11/30/2019 14:18    Medications:  I have reviewed the patient's current medications. Scheduled: .  stroke: mapping our early stages of recovery book   Does not apply Once  . aspirin EC  81 mg Oral Daily  . atenolol  50 mg Oral Daily  . enoxaparin (LOVENOX) injection  40 mg Subcutaneous Q24H  . fenofibrate  160 mg Oral QPM  . insulin aspart  0-5 Units Subcutaneous QHS  . insulin aspart  0-9 Units Subcutaneous TID WC  . pravastatin  20 mg Oral QPM    Assessment/Plan: 69 y.o. male medical history significant ofhypertension, hyperlipidemia, diabetes mellitus, who presents with dizziness and gait ataxia.  Patient on statin and ASA prior to admission.  MRI of the brain personally reviewed and reveals acute left cerebellar infarcts and a punctate right cerebellar infarct. 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  Infarcts likely  embolic from these lesions.  Echocardiogram is pending.  A1c 6.2, LDL 77.     Stroke Risk Factors - diabetes mellitus, hyperlipidemia and hypertension  Plan: 1. Aggressive lipid management with target LDL<70. 2. PT, OT, Speech therapy 3. Echocardiogram pending 4. Prophylactic therapy-Dual antiplatelet therapy with ASA 81mg  and Plavix 75mg  for three weeks with change to Plavix 75mg  daily alone as monotherapy after that time. 5. Telemetry monitoring 6. Frequent neuro checks    LOS: 0 days   Alexis Goodell, MD Neurology [phone] 12/01/2019  11:15 AM

## 2019-12-01 NOTE — Plan of Care (Signed)
Pt d/ced home w/home health.

## 2019-12-01 NOTE — Care Management Obs Status (Signed)
[place] NOTIFICATION   Patient Details  Name: Akoni Parton MRN: [id number] Date of Birth: 09-28-1950   Medicare Observation Status Notification Given:  Yes    Shelbie Hutching, RN 12/01/2019, 3:08 PM

## 2019-12-01 NOTE — Discharge Summary (Signed)
LowerPhysician Discharge Summary  George Schmidt ELF:[id number] DOB: Feb 16, 1951 DOA: 11/30/2019  PCP: [name], MD  Admit date: 11/30/2019 Discharge date: 12/01/2019  Admitted From: Home Disposition: Home   Recommendations for Outpatient Follow-up:  1. Follow up with PCP in 1-2 weeks 2. Follow-up with neurology 3. Please obtain BMP/CBC in one week 4. Please follow up on the following pending results:None  Home Health: Yes Equipment/Devices: Rolling walker Discharge Condition: Stable CODE STATUS: Full Diet recommendation: Heart Healthy / Carb Modified   Brief/Interim Summary: George Schmidt is a 69 y.o. male with medical history significant of hypertension, hyperlipidemia, diabetes mellitus, who presents with dizziness.  Patient states that his symptoms started at about 6:30 PM yesterday.  He has dizziness and poor balance.  He has been feeling room spinning around him.  Has some left-sided headache.  He has generalized weakness, denies unilateral numbness or tingling his extremities.  No facial droop or slurred speech.  Patient states that he has nausea and vomited several times with brownish colored material last night.  His nausea and vomiting has resolved.  pt was found to have troponin 85, 103, negative COVID-19 PCR, potassium 2.5, renal function okay, temperature normal, blood pressure 135/7 1, tachycardia, RR 20, oxygen saturation 100% on room air.  CT head negative for acute intracranial abnormalities.  Patient was admitted under observation To rule out CVA.  Neurology was consulted.  MRI with acute left cerebellar and a punctate right cerebellar infarct.  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  Infarcts were thought to be embolic in origin.  Echocardiogram was negative for any cardiac source.  Patient was started on dual antiplatelets with instructions to continue taking aspirin and Plavix for 3 weeks and then  continue with Plavix alone.  He needs to follow-up with neurology as an outpatient for further management.  He recommended home health services which were ordered.  His home dose of antihypertensives were held initially and he can resume from next day after discharge.  Patient found to have mildly elevated troponin most likely secondary to demand as there was no chest pain and EKG without any acute changes.  Echocardiogram without any wall motion abnormalities.  Patient will resume his home meds and follow-up with his primary care physician.  Discharge Diagnoses:  Principal Problem:   Cerebellar stroke, acute ([place]) Active Problems:   Hypertension   Diabetes mellitus without complication ([place])   Hyperlipidemia   Elevated troponin   Hypokalemia   Cerebellar stroke [facility])   Discharge Instructions  Discharge Instructions    Diet - low sodium heart healthy   Complete by: As directed    Discharge instructions   Complete by: As directed    It was pleasure taking care of you. We will take aspirin and Plavix together for 3 weeks and then continue with Plavix only. Please follow-up with neurology as an outpatient.   Increase activity slowly   Complete by: As directed      Allergies as of 12/01/2019      Reactions   Amoxicillin Hives   Clarithromycin Hives      Medication List    STOP taking these medications   pravastatin 20 MG tablet Commonly known as: PRAVACHOL     TAKE these medications   amLODipine 10 MG tablet Commonly known as: NORVASC Take 10 mg by mouth daily.   aspirin 81 MG EC tablet Take by mouth.   atenolol-chlorthalidone 50-25 MG tablet Commonly known as:  TENORETIC Take 1 tablet by mouth daily.   atorvastatin 40 MG tablet Commonly known as: Lipitor Take 1 tablet (40 mg total) by mouth daily.   clopidogrel 75 MG tablet Commonly known as: PLAVIX Take 1 tablet (75 mg total) by mouth daily.   fenofibrate 160 MG tablet Take 160 mg by mouth daily.    ibuprofen 200 MG tablet Commonly known as: ADVIL Take 200 mg by mouth every 6 (six) hours as needed.   metFORMIN 500 MG 24 hr tablet Commonly known as: GLUCOPHAGE-XR Take 2 tablets by mouth once daily   potassium chloride SA 20 MEQ tablet Commonly known as: KLOR-CON Take 20 mEq by mouth 2 (two) times daily.   sildenafil 100 MG tablet Commonly known as: VIAGRA Take by mouth.            Durable Medical Equipment  (From admission, onward)         Start     Ordered   12/01/19 1841  For home use only DME 4 wheeled rolling walker with seat  Once       Question:  Patient needs a walker to treat with the following condition  Answer:  CVA (cerebral vascular accident) ([place])   12/01/19 1841          Allergies  Allergen Reactions  . Amoxicillin Hives  . Clarithromycin Hives    Consultations:  Neurology  Procedures/Studies: CT ANGIO HEAD W OR WO CONTRAST  Result Date: 11/30/2019 CLINICAL DATA:  Cerebellar infarcts EXAM: CT ANGIOGRAPHY HEAD AND NECK TECHNIQUE: Multidetector CT imaging of the head and neck was performed using the standard protocol during bolus administration of intravenous contrast. Multiplanar CT image reconstructions and MIPs were obtained to evaluate the vascular anatomy. Carotid stenosis measurements (when applicable) are obtained utilizing NASCET criteria, using the distal internal carotid diameter as the denominator. CONTRAST:  74mL OMNIPAQUE IOHEXOL 350 MG/ML SOLN COMPARISON:  None. FINDINGS: CTA NECK Aortic arch: Great vessel origins are patent. Right carotid system: Patent. Mild calcified plaque along the common carotid. Mild calcified plaque at the ICA origin without measurable stenosis. Left carotid system: Patent. Mild calcified and noncalcified plaque at the ICA origin causing minimal stenosis. Mild plaque along the distal cervical ICA. Vertebral arteries: Right vertebral artery is patent. Proximal left vertebral arteries patent with calcified plaque  at the origin likely causing mild stenosis. There is loss of enhancement at the level of the C2 transverse foramen without reconstitution in the neck. Skeleton: Multilevel degenerative changes of the cervical spine. Other neck: No mass or adenopathy. Upper chest: No apical lung mass. Review of the MIP images confirms the above findings CTA HEAD Anterior circulation: Intracranial internal carotid arteries are patent with calcified plaque causing mild to moderate stenosis. Anterior cerebral arteries are patent. Anterior communicating artery is present. Middle cerebral arteries are patent. Posterior circulation: Intracranial right vertebral artery and PICA origins are patent. There is minimal reconstitution of the left vertebral artery distally adjacent to the basilar artery origin. Left PICA origin appears occluded. Basilar artery is patent with moderate to marked stenosis with mid segment. Small caliber AICAs are present. Superior cerebral artery origins are patent. Posterior cerebral arteries are patent. Venous sinuses: Patent as allowed by contrast bolus timing. 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 Left PICA origin appears occluded. 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 No hemodynamically significant  stenosis at the ICA origins. Electronically Signed   By: Macy Mis M.D.   On: 11/30/2019 16:55   CT Head Wo Contrast  Result Date: 11/30/2019 CLINICAL DATA:  Ataxia, stroke suspected. Additional provided: Weakness and dizziness, vomiting EXAM: CT HEAD WITHOUT CONTRAST TECHNIQUE: Contiguous axial images were obtained from the base of the skull through the vertex without intravenous contrast. COMPARISON:  No pertinent prior studies available for comparison. FINDINGS: Brain: Mild generalized parenchymal atrophy. Mild ill-defined  hypoattenuation within the cerebral white matter is nonspecific, but consistent chronic small vessel ischemic disease. There is no acute intracranial hemorrhage. No demarcated cortical infarct. No extra-axial fluid collection. No evidence of intracranial mass. No midline shift. Vascular: No hyperdense vessel.  Atherosclerotic calcifications. Skull: Normal. Negative for fracture or focal lesion. Sinuses/Orbits: Visualized orbits show no acute finding. Mild ethmoid sinus mucosal thickening. No significant mastoid effusion. IMPRESSION: No CT evidence of acute intracranial abnormality. Mild generalized parenchymal atrophy and chronic small vessel ischemic disease. Mild ethmoid sinus mucosal thickening. Electronically Signed   By: Kellie Simmering DO   On: 11/30/2019 10:54   CT ANGIO NECK W OR WO CONTRAST  Result Date: 11/30/2019 CLINICAL DATA:  Cerebellar infarcts EXAM: CT ANGIOGRAPHY HEAD AND NECK TECHNIQUE: Multidetector CT imaging of the head and neck was performed using the standard protocol during bolus administration of intravenous contrast. Multiplanar CT image reconstructions and MIPs were obtained to evaluate the vascular anatomy. Carotid stenosis measurements (when applicable) are obtained utilizing NASCET criteria, using the distal internal carotid diameter as the denominator. CONTRAST:  69mL OMNIPAQUE IOHEXOL 350 MG/ML SOLN COMPARISON:  None. FINDINGS: CTA NECK Aortic arch: Great vessel origins are patent. Right carotid system: Patent. Mild calcified plaque along the common carotid. Mild calcified plaque at the ICA origin without measurable stenosis. Left carotid system: Patent. Mild calcified and noncalcified plaque at the ICA origin causing minimal stenosis. Mild plaque along the distal cervical ICA. Vertebral arteries: Right vertebral artery is patent. Proximal left vertebral arteries patent with calcified plaque at the origin likely causing mild stenosis. There is loss of enhancement at the level of the  C2 transverse foramen without reconstitution in the neck. Skeleton: Multilevel degenerative changes of the cervical spine. Other neck: No mass or adenopathy. Upper chest: No apical lung mass. Review of the MIP images confirms the above findings CTA HEAD Anterior circulation: Intracranial internal carotid arteries are patent with calcified plaque causing mild to moderate stenosis. Anterior cerebral arteries are patent. Anterior communicating artery is present. Middle cerebral arteries are patent. Posterior circulation: Intracranial right vertebral artery and PICA origins are patent. There is minimal reconstitution of the left vertebral artery distally adjacent to the basilar artery origin. Left PICA origin appears occluded. Basilar artery is patent with moderate to marked stenosis with mid segment. Small caliber AICAs are present. Superior cerebral artery origins are patent. Posterior cerebral arteries are patent. Venous sinuses: Patent as allowed by contrast bolus timing. 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 Left PICA origin appears occluded. 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 No hemodynamically significant stenosis at the ICA origins. Electronically Signed   By: Macy Mis M.D.   On: 11/30/2019 16:55   MR BRAIN WO CONTRAST  Result Date: 11/30/2019 CLINICAL DATA:  Ataxia, suspected stroke. EXAM: MRI HEAD WITHOUT CONTRAST TECHNIQUE: Multiplanar, multiecho pulse sequences of the brain and surrounding structures were  obtained without intravenous contrast. COMPARISON:  11/30/2019 head CT. FINDINGS: Brain: Mild diffuse parenchymal atrophy with ex vacuo dilatation. Multifocal restricted diffusion with correlated of T2/FLAIR hyperintensity involving the inferior left cerebellum reflects acute infarcts. Punctate foci of restricted  diffusion involving the right cerebellum also reflect acute infarcts (5:14). No intracranial hemorrhage. Background scattered T2/FLAIR hyperintense foci involving the periventricular and subcortical white matter reflect chronic microvascular ischemic changes. No midline shift or extra-axial fluid collection. No mass lesion. Vascular: Normal flow voids. Skull and upper cervical spine: Normal marrow signal. Sinuses/Orbits: Normal orbits. Clear paranasal sinuses. No mastoid effusion. Other: None. IMPRESSION: Acute inferior left cerebellar infarcts. Punctate acute right cerebellar infarct. Mild cerebral atrophy.  Mild chronic microvascular ischemic changes. These results were called by telephone at the time of interpretation on 11/30/2019 at 2:17 Pm to provider Dr. Ivor Costa , who verbally acknowledged these results. Electronically Signed   By: Primitivo Gauze M.D.   On: 11/30/2019 14:18   ECHOCARDIOGRAM COMPLETE  Result Date: 12/01/2019    ECHOCARDIOGRAM REPORT   Patient Name:   TRANELL WOJTKIEWICZ Date of Exam: 12/01/2019 Medical Rec #:  [id number]        Height:       69.0 in Accession #:    [id number]       Weight:       265.0 lb Date of Birth:  [birth year]        BSA:          2.328 m Patient Age:    69 years         BP:           116/67 mmHg Patient Gender: M                HR:           58 bpm. Exam Location:  [facility] Procedure: 2D Echo, Color Doppler, Cardiac Doppler and Intracardiac            Opacification Agent Indications:     I163.9 Stroke  History:         Patient has no prior history of Echocardiogram examinations.                  Risk Factors:Hypertension, Diabetes and Dyslipidemia.  Sonographer:     Charmayne Sheer RDCS (AE) Referring Phys:  [id number] Soledad Gerlach NIU Diagnosing Phys: Kate Sable MD  Sonographer Comments: Suboptimal parasternal window. Image acquisition challenging due to patient body habitus. IMPRESSIONS  1. Left ventricular ejection fraction, by estimation, is 55 to 60%. The left ventricle has  normal function. The left ventricle has no regional wall motion abnormalities. Left ventricular diastolic parameters were normal.  2. Right ventricular systolic function is normal. The right ventricular size is normal.  3. The mitral valve is normal in structure. No evidence of mitral valve regurgitation. No evidence of mitral stenosis.  4. The aortic valve is normal in structure. Aortic valve regurgitation is not visualized. No aortic stenosis is present.  5. Aortic dilatation noted. There is mild dilatation of the aortic root measuring 39 mm.  6. The inferior vena cava is normal in size with greater than 50% respiratory variability, suggesting right atrial pressure of 3 mmHg. Conclusion(s)/Recommendation(s): No intracardiac source of embolism detected on this transthoracic study. A transesophageal echocardiogram is recommended to exclude cardiac source of embolism if clinically indicated. FINDINGS  Left Ventricle: Left ventricular ejection fraction, by estimation, is 55 to 60%. The left ventricle has normal function. The left ventricle has no  regional wall motion abnormalities. Definity contrast agent was given IV to delineate the left ventricular  endocardial borders. The left ventricular internal cavity size was normal in size. There is no left ventricular hypertrophy. Left ventricular diastolic parameters were normal. Right Ventricle: The right ventricular size is normal. No increase in right ventricular wall thickness. Right ventricular systolic function is normal. Left Atrium: Left atrial size was normal in size. Right Atrium: Right atrial size was normal in size. Pericardium: There is no evidence of pericardial effusion. Mitral Valve: The mitral valve is normal in structure. Normal mobility of the mitral valve leaflets. No evidence of mitral valve regurgitation. No evidence of mitral valve stenosis. MV peak gradient, 4.2 mmHg. The mean mitral valve gradient is 1.0 mmHg. Tricuspid Valve: The tricuspid valve  is normal in structure. Tricuspid valve regurgitation is not demonstrated. No evidence of tricuspid stenosis. Aortic Valve: The aortic valve is normal in structure. Aortic valve regurgitation is not visualized. No aortic stenosis is present. Aortic valve mean gradient measures 4.0 mmHg. Aortic valve peak gradient measures 7.7 mmHg. Aortic valve area, by VTI measures 2.93 cm. Pulmonic Valve: The pulmonic valve was not well visualized. Pulmonic valve regurgitation is not visualized. No evidence of pulmonic stenosis. Aorta: Aortic dilatation noted. There is mild dilatation of the aortic root measuring 39 mm. Venous: The inferior vena cava is normal in size with greater than 50% respiratory variability, suggesting right atrial pressure of 3 mmHg. IAS/Shunts: No atrial level shunt detected by color flow Doppler.  LEFT VENTRICLE PLAX 2D LVIDd:         4.60 cm  Diastology LVIDs:         3.37 cm  LV e' lateral:   12.40 cm/s LV PW:         1.27 cm  LV E/e' lateral: 7.3 LV IVS:        1.12 cm  LV e' medial:    6.96 cm/s LVOT diam:     2.40 cm  LV E/e' medial:  13.0 LV SV:         93 LV SV Index:   40 LVOT Area:     4.52 cm  RIGHT VENTRICLE RV Basal diam:  3.18 cm LEFT ATRIUM             Index       RIGHT ATRIUM           Index LA diam:        4.50 cm 1.93 cm/m  RA Area:     20.40 cm LA Vol (A2C):   44.8 ml 19.24 ml/m RA Volume:   57.70 ml  24.78 ml/m LA Vol (A4C):   51.6 ml 22.16 ml/m LA Biplane Vol: 50.7 ml 21.77 ml/m  AORTIC VALVE                   PULMONIC VALVE AV Area (Vmax):    2.84 cm    PV Vmax:       0.99 m/s AV Area (Vmean):   3.04 cm    PV Vmean:      73.600 cm/s AV Area (VTI):     2.93 cm    PV VTI:        0.263 m AV Vmax:           139.00 cm/s PV Peak grad:  3.9 mmHg AV Vmean:          92.600 cm/s PV Mean grad:  2.0 mmHg AV VTI:  0.316 m AV Peak Grad:      7.7 mmHg AV Mean Grad:      4.0 mmHg LVOT Vmax:         87.20 cm/s LVOT Vmean:        62.200 cm/s LVOT VTI:          0.205 m LVOT/AV VTI  ratio: 0.65  AORTA Ao Root diam: 3.90 cm MITRAL VALVE MV Area (PHT): 4.86 cm    SHUNTS MV Peak grad:  4.2 mmHg    Systemic VTI:  0.20 m MV Mean grad:  1.0 mmHg    Systemic Diam: 2.40 cm MV Vmax:       1.02 m/s MV Vmean:      55.0 cm/s MV Decel Time: 156 msec MV E velocity: 90.70 cm/s MV A velocity: 60.10 cm/s MV E/A ratio:  1.51 Kate Sable MD Electronically signed by Kate Sable MD Signature Date/Time: 12/01/2019/1:15:54 PM    Final     Subjective: Patient's dizziness is improving.  He was able to go to bathroom with rolling walker.  Continue to lean on left.  Discharge Exam: Vitals:   12/01/19 0811 12/01/19 1136  BP: 116/67 (!) 143/77  Pulse: (!) 55 (!) 51  Resp: (!) 22 18  Temp: 99.7 F (37.6 C) 97.8 F (36.6 C)  SpO2: 97% 95%   Vitals:   12/01/19 0000 12/01/19 0430 12/01/19 0811 12/01/19 1136  BP: 112/69 111/67 116/67 (!) 143/77  Pulse: 71 60 (!) 55 (!) 51  Resp: 20  (!) 22 18  Temp: 98.6 F (37 C) 98.5 F (36.9 C) 99.7 F (37.6 C) 97.8 F (36.6 C)  TempSrc: Oral Oral Oral Oral  SpO2: 97% 97% 97% 95%  Weight:      Height:        General: Pt is alert, awake, not in acute distress Cardiovascular: RRR, S1/S2 +, no rubs, no gallops Respiratory: CTA bilaterally, no wheezing, no rhonchi Abdominal: Soft, NT, ND, bowel sounds + Extremities: no edema, no cyanosis   The results of significant diagnostics from this hospitalization (including imaging, microbiology, ancillary and laboratory) are listed below for reference.    Microbiology: Recent Results (from the past 240 hour(s))  SARS Coronavirus 2 by RT PCR (hospital order, performed in [facility] hospital lab) Nasopharyngeal Nasopharyngeal Swab     Status: None   Collection Time: 11/30/19 10:13 AM   Specimen: Nasopharyngeal Swab  Result Value Ref Range Status   SARS Coronavirus 2 NEGATIVE NEGATIVE Final    Comment: (NOTE) SARS-CoV-2 target nucleic acids are NOT DETECTED.  The SARS-CoV-2 RNA is generally  detectable in upper and lower respiratory specimens during the acute phase of infection. The lowest concentration of SARS-CoV-2 viral copies this assay can detect is 250 copies / mL. A negative result does not preclude SARS-CoV-2 infection and should not be used as the sole basis for treatment or other patient management decisions.  A negative result may occur with improper specimen collection / handling, submission of specimen other than nasopharyngeal swab, presence of viral mutation(s) within the areas targeted by this assay, and inadequate number of viral copies (<250 copies / mL). A negative result must be combined with clinical observations, patient history, and epidemiological information.  Fact Sheet for Patients:   StrictlyIdeas.no  Fact Sheet for Healthcare Providers: BankingDealers.co.za  This test is not yet approved or  cleared by the [place] FDA and has been authorized for detection and/or diagnosis of SARS-CoV-2 by FDA under an Emergency Use  Authorization (EUA).  This EUA will remain in effect (meaning this test can be used) for the duration of the COVID-19 declaration under Section 564(b)(1) of the Act, 21 U.S.C. section 360bbb-3(b)(1), unless the authorization is terminated or revoked sooner.  Performed at [facility], [address] [place], [place] [address]      Labs: BNP (last 3 results) No results for input(s): BNP in the last 8760 hours. Basic Metabolic Panel: Recent Labs  Lab 11/30/19 1013 11/30/19 2257 12/01/19 0507  NA 142  --   --   K 2.5* 3.7  --   CL 113*  --   --   CO2 17*  --   --   GLUCOSE 161*  --   --   BUN 23  --   --   CREATININE 1.04  --   --   CALCIUM 7.4*  --   --   MG  --   --  1.9   Liver Function Tests: Recent Labs  Lab 11/30/19 1013  AST 20  ALT 20  ALKPHOS 24*  BILITOT 0.9  PROT 6.0*  ALBUMIN 3.4*   No results for input(s): LIPASE, AMYLASE in the  last 168 hours. No results for input(s): AMMONIA in the last 168 hours. CBC: Recent Labs  Lab 11/30/19 1013  WBC 10.1  NEUTROABS 8.7*  HGB 13.3  HCT 38.3*  MCV 93.2  PLT 203   Cardiac Enzymes: No results for input(s): CKTOTAL, CKMB, CKMBINDEX, TROPONINI in the last 168 hours. BNP: Invalid input(s): POCBNP CBG: Recent Labs  Lab 11/30/19 1811 11/30/19 2049 12/01/19 0757 12/01/19 1140 12/01/19 1555  GLUCAP 144* 155* 148* 130* 136*   D-Dimer No results for input(s): DDIMER in the last 72 hours. Hgb A1c Recent Labs    12/01/19 0507  HGBA1C 6.2*   Lipid Profile Recent Labs    12/01/19 0507  CHOL 170  HDL 26*  LDLCALC 77  TRIG 337*  CHOLHDL 6.5   Thyroid function studies No results for input(s): TSH, T4TOTAL, T3FREE, THYROIDAB in the last 72 hours.  Invalid input(s): FREET3 Anemia work up No results for input(s): VITAMINB12, FOLATE, FERRITIN, TIBC, IRON, RETICCTPCT in the last 72 hours. Urinalysis No results found for: COLORURINE, APPEARANCEUR, [place], [place], [place], [place], [place], [place], PROTEINUR, UROBILINOGEN, NITRITE, LEUKOCYTESUR Sepsis Labs Invalid input(s): PROCALCITONIN,  WBC,  LACTICIDVEN Microbiology Recent Results (from the past 240 hour(s))  SARS Coronavirus 2 by RT PCR (hospital order, performed in [facility] hospital lab) Nasopharyngeal Nasopharyngeal Swab     Status: None   Collection Time: 11/30/19 10:13 AM   Specimen: Nasopharyngeal Swab  Result Value Ref Range Status   SARS Coronavirus 2 NEGATIVE NEGATIVE Final    Comment: (NOTE) SARS-CoV-2 target nucleic acids are NOT DETECTED.  The SARS-CoV-2 RNA is generally detectable in upper and lower respiratory specimens during the acute phase of infection. The lowest concentration of SARS-CoV-2 viral copies this assay can detect is 250 copies / mL. A negative result does not preclude SARS-CoV-2 infection and should not be used as the sole basis for treatment or other patient  management decisions.  A negative result may occur with improper specimen collection / handling, submission of specimen other than nasopharyngeal swab, presence of viral mutation(s) within the areas targeted by this assay, and inadequate number of viral copies (<250 copies / mL). A negative result must be combined with clinical observations, patient history, and epidemiological information.  Fact Sheet for Patients:   StrictlyIdeas.no  Fact Sheet for Healthcare  Providers: BankingDealers.co.za  This test is not yet approved or  cleared by the [place] and has been authorized for detection and/or diagnosis of SARS-CoV-2 by FDA under an Emergency Use Authorization (EUA).  This EUA will remain in effect (meaning this test can be used) for the duration of the COVID-19 declaration under Section 564(b)(1) of the Act, 21 U.S.C. section 360bbb-3(b)(1), unless the authorization is terminated or revoked sooner.  Performed at [facility], [address] [place], [place] [address]     Time coordinating discharge: Over 30 minutes  SIGNED:  Lorella Nimrod, MD  [facility] 12/01/2019, 6:53 PM  If 7PM-7AM, please contact night-coverage [email or web address]  This record has been created using Systems analyst. Errors have been sought and corrected,but may not always be located. Such creation errors do not reflect on the standard of care.

## 2019-12-01 NOTE — Progress Notes (Signed)
PT Cancellation Note  Patient Details Name: Haylen Shelnutt MRN: [id number] DOB: 13-Feb-1951   Cancelled Treatment:    Reason Eval/Treat Not Completed: Other (comment).  PT consult received.  Chart reviewed.  Upon PT arrival to pt's room, Echo in process (pt not available for PT session).  Will re-attempt PT evaluation at a later date/time as able.  Leitha Bleak, PT 12/01/19, 9:01 AM

## 2019-12-08 ENCOUNTER — Ambulatory Visit: Payer: Medicare Other | Admitting: Dermatology

## 2020-01-10 MED FILL — Acetaminophen Tab 325 MG: ORAL | Qty: 650 | Status: AC

## 2020-03-09 ENCOUNTER — Ambulatory Visit: Payer: Medicare Other | Admitting: Dermatology

## 2020-03-15 ENCOUNTER — Ambulatory Visit: Payer: Medicare Other | Admitting: Dermatology

## 2020-04-27 ENCOUNTER — Ambulatory Visit: Payer: Medicare Other | Admitting: Dermatology

## 2020-04-27 ENCOUNTER — Other Ambulatory Visit: Payer: Self-pay

## 2020-04-27 DIAGNOSIS — Z85828 Personal history of other malignant neoplasm of skin: Secondary | ICD-10-CM

## 2020-04-27 DIAGNOSIS — C4492 Squamous cell carcinoma of skin, unspecified: Secondary | ICD-10-CM

## 2020-04-27 DIAGNOSIS — D229 Melanocytic nevi, unspecified: Secondary | ICD-10-CM

## 2020-04-27 DIAGNOSIS — D485 Neoplasm of uncertain behavior of skin: Secondary | ICD-10-CM | POA: Diagnosis not present

## 2020-04-27 DIAGNOSIS — L821 Other seborrheic keratosis: Secondary | ICD-10-CM

## 2020-04-27 DIAGNOSIS — L570 Actinic keratosis: Secondary | ICD-10-CM

## 2020-04-27 DIAGNOSIS — C44519 Basal cell carcinoma of skin of other part of trunk: Secondary | ICD-10-CM

## 2020-04-27 DIAGNOSIS — L814 Other melanin hyperpigmentation: Secondary | ICD-10-CM | POA: Diagnosis not present

## 2020-04-27 DIAGNOSIS — C44329 Squamous cell carcinoma of skin of other parts of face: Secondary | ICD-10-CM

## 2020-04-27 DIAGNOSIS — Z1283 Encounter for screening for malignant neoplasm of skin: Secondary | ICD-10-CM

## 2020-04-27 DIAGNOSIS — C44619 Basal cell carcinoma of skin of left upper limb, including shoulder: Secondary | ICD-10-CM | POA: Diagnosis not present

## 2020-04-27 DIAGNOSIS — D489 Neoplasm of uncertain behavior, unspecified: Secondary | ICD-10-CM

## 2020-04-27 DIAGNOSIS — D1800 Hemangioma unspecified site: Secondary | ICD-10-CM

## 2020-04-27 DIAGNOSIS — L578 Other skin changes due to chronic exposure to nonionizing radiation: Secondary | ICD-10-CM

## 2020-04-27 HISTORY — DX: Squamous cell carcinoma of skin, unspecified: C44.92

## 2020-04-27 NOTE — Patient Instructions (Signed)

## 2020-04-27 NOTE — Progress Notes (Signed)
Follow-Up Visit   Subjective  George Schmidt is a 69 y.o. male who presents for the following: Actinic Keratosis (4 month follow up ).  Patient reports having a stroke recently but is doing well. He denies any concerns with areas previously treated.  The following portions of the chart were reviewed this encounter and updated as appropriate:  Tobacco  Allergies  Meds  Problems  Med Hx  Surg Hx  Fam Hx        Objective  Well appearing patient in no apparent distress; mood and affect are within normal limits.  A focused examination was performed including upper extremities, including the arms, hands, fingers, and fingernails and chest, back, bilateral arms, face, head . Relevant physical exam findings are noted in the Assessment and Plan.  Objective  Right Forearm: scaly pink papule 0.7 cm        Objective  right temple: 0.6 cm pink papule      Objective  Left Upper back lateral: 0.7 cm pink papule        Objective  Left Shoulder Posterior adjacent to white patch: 0.5 cm pink papule adjacent to white patch      Objective  left upper medial back: 0.8 cm pink papule        Objective  Right Forearm - Anterior: Erythematous thin papules/macules with gritty scale.   Assessment & Plan  Neoplasm of uncertain behavior (5) Right Forearm  Skin / nail biopsy Type of biopsy: tangential   Informed consent: discussed and consent obtained   Patient was prepped and draped in usual sterile fashion: Area prepped with alcohol. Anesthesia: the lesion was anesthetized in a standard fashion   Anesthetic:  1% lidocaine w/ epinephrine 1-100,000 buffered w/ 8.4% NaHCO3 Instrument used: flexible razor blade   Hemostasis achieved with: pressure, aluminum chloride and electrodesiccation   Outcome: patient tolerated procedure well   Post-procedure details: wound care instructions given   Post-procedure details comment:  Ointment and small bandage  applied  Specimen 1 - Surgical pathology Differential Diagnosis: r/o scc  Check Margins: No R/o scc scaly pink papule 0.7 cm  right temple  Skin / nail biopsy Type of biopsy: tangential   Informed consent: discussed and consent obtained   Patient was prepped and draped in usual sterile fashion: Area prepped with alcohol. Anesthesia: the lesion was anesthetized in a standard fashion   Anesthetic:  1% lidocaine w/ epinephrine 1-100,000 buffered w/ 8.4% NaHCO3 Instrument used: flexible razor blade   Hemostasis achieved with: pressure, aluminum chloride and electrodesiccation   Outcome: patient tolerated procedure well   Post-procedure details: wound care instructions given   Post-procedure details comment:  Ointment and small bandage applied  Specimen 2 - Surgical pathology Differential Diagnosis: r/o scc   Check Margins: No Right temple r/o scc 0.6 cm pink papule  Left Upper back lateral  Skin / nail biopsy Type of biopsy: tangential   Informed consent: discussed and consent obtained   Patient was prepped and draped in usual sterile fashion: Area prepped with alcohol. Anesthesia: the lesion was anesthetized in a standard fashion   Anesthetic:  1% lidocaine w/ epinephrine 1-100,000 buffered w/ 8.4% NaHCO3 Instrument used: flexible razor blade   Hemostasis achieved with: pressure, aluminum chloride and electrodesiccation   Outcome: patient tolerated procedure well   Post-procedure details: wound care instructions given   Post-procedure details comment:  Ointment and small bandage applied  Specimen 3 - Surgical pathology Differential Diagnosis: r/o bcc  Check Margins: No  0.7  cm pink papule  Left upper back lateral  Left Shoulder Posterior adjacent to white patch  Skin / nail biopsy Type of biopsy: tangential   Informed consent: discussed and consent obtained   Patient was prepped and draped in usual sterile fashion: Area prepped with alcohol. Anesthesia: the lesion  was anesthetized in a standard fashion   Anesthetic:  1% lidocaine w/ epinephrine 1-100,000 buffered w/ 8.4% NaHCO3 Instrument used: flexible razor blade   Hemostasis achieved with: pressure, aluminum chloride and electrodesiccation   Outcome: patient tolerated procedure well   Post-procedure details: wound care instructions given   Post-procedure details comment:  Ointment and small bandage applied  Specimen 4 - Surgical pathology Differential Diagnosis: r/o bcc  Check Margins: No  0.5 cm pink papule adjacent to white patch  left upper medial back  Skin / nail biopsy Type of biopsy: tangential   Informed consent: discussed and consent obtained   Patient was prepped and draped in usual sterile fashion: Area prepped with alcohol. Anesthesia: the lesion was anesthetized in a standard fashion   Anesthetic:  1% lidocaine w/ epinephrine 1-100,000 buffered w/ 8.4% NaHCO3 Instrument used: flexible razor blade   Hemostasis achieved with: pressure, aluminum chloride and electrodesiccation   Outcome: patient tolerated procedure well   Post-procedure details: wound care instructions given   Post-procedure details comment:  Ointment and small bandage applied  Specimen 5 - Surgical pathology Differential Diagnosis: r/o bcc  Check Margins: No 0.8 cm pink papule   If positive for at right temple will refer to skin surgery center for mohs.  Actinic keratosis Right Forearm - Anterior  Prior to procedure, discussed risks of blister formation, small wound, skin dyspigmentation, or rare scar following cryotherapy.    Destruction of lesion - Right Forearm - Anterior Complexity: simple   Destruction method: cryotherapy   Informed consent: discussed and consent obtained   Lesion destroyed using liquid nitrogen: Yes   Region frozen until ice ball extended beyond lesion: Yes   Outcome: patient tolerated procedure well with no complications   Post-procedure details: wound care instructions  given     Lentigines - Scattered tan macules - Discussed due to sun exposure - Benign, observe - Call for any changes  Seborrheic Keratoses - Stuck-on, waxy, tan-brown papules and plaques  - Discussed benign etiology and prognosis. - Observe - Call for any changes  Melanocytic Nevi - Tan-brown and/or pink-flesh-colored symmetric macules and papules - Benign appearing on exam today - Observation - Call clinic for new or changing moles - Recommend daily use of broad spectrum spf 30+ sunscreen to sun-exposed areas.   Hemangiomas - Red papules - Discussed benign nature - Observe - Call for any changes  Actinic Damage - Chronic, secondary to cumulative UV/sun exposure - diffuse scaly erythematous macules with underlying dyspigmentation - Recommend daily broad spectrum sunscreen SPF 30+ to sun-exposed areas, reapply every 2 hours as needed.  - Call for new or changing lesions.  History of Basal Cell Carcinoma of the Skin - No evidence of recurrence today - Recommend regular full body skin exams - Recommend daily broad spectrum sunscreen SPF 30+ to sun-exposed areas, reapply every 2 hours as needed.  - Call if any new or changing lesions are noted between office visits  Skin cancer screening performed today.   Return in about 6 months (around 10/26/2020) for ak followup and lower body check, also follow up as needed .   I, Ruthell Rummage, CMA, am acting as scribe for Forest Gleason, MD.  Documentation: I have reviewed the above documentation for accuracy and completeness, and I agree with the above.  Forest Gleason, MD

## 2020-05-01 NOTE — Progress Notes (Signed)
1. 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 It is not dangerous but is contagious and can spread to other areas of skin or other people if it is not completely gone. No additional treatment is needed. However, if it comes back, we can freeze it in clinic with liquid nitrogen (a quick in office procedure) or you can also treat it at home with an over the counter salicylic wart treatment (slower).  2. 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 Skin , left upper back lateral SUPERFICIAL BASAL CELL CARCINOMA --> ED&C  4. Skin , left shoulder posterior ajacent to white patch SUPERFICIAL BASAL CELL CARCINOMA --> ED&C  5. Skin , left upper medial back SUPERFICIAL BASAL CELL CARCINOMA --> ED&C  MAs please call and schedule for [facility] in next couple of months and refer for Mohs. If he has any questions, please let me know. Thank you!

## 2020-05-02 ENCOUNTER — Encounter: Payer: Self-pay | Admitting: Dermatology

## 2020-05-02 ENCOUNTER — Other Ambulatory Visit: Payer: Self-pay

## 2020-05-02 ENCOUNTER — Telehealth: Payer: Self-pay

## 2020-05-02 DIAGNOSIS — C4492 Squamous cell carcinoma of skin, unspecified: Secondary | ICD-10-CM

## 2020-05-02 NOTE — Telephone Encounter (Signed)
-----   Message from Alfonso Patten, MD sent at 05/01/2020  5:27 PM EST ----- 1. 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 It is not dangerous but is contagious and can spread to other areas of skin or other people if it is not completely gone. No additional treatment is needed. However, if it comes back, we can freeze it in clinic with liquid nitrogen (a quick in office procedure) or you can also treat it at home with an over the counter salicylic wart treatment (slower).  2. 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 Skin , left upper back lateral SUPERFICIAL BASAL CELL CARCINOMA --> ED&C  4. Skin , left shoulder posterior ajacent to white patch SUPERFICIAL BASAL CELL CARCINOMA --> ED&C  5. Skin , left upper medial back SUPERFICIAL BASAL CELL CARCINOMA --> ED&C  MAs please call and schedule for [facility] in next couple of months and refer for Mohs. If he has any questions, please let me know. Thank you!

## 2020-05-02 NOTE — Telephone Encounter (Signed)
Patient advised bx's wart, BCC and SCCis. Patient scheduled for EDC x 3, will send referral to [facility] per patient for Moh's, JS

## 2020-05-25 ENCOUNTER — Encounter: Payer: Self-pay | Admitting: Dermatology

## 2020-05-25 ENCOUNTER — Ambulatory Visit: Payer: Medicare Other | Admitting: Dermatology

## 2020-05-25 ENCOUNTER — Other Ambulatory Visit: Payer: Self-pay

## 2020-05-25 DIAGNOSIS — B079 Viral wart, unspecified: Secondary | ICD-10-CM | POA: Diagnosis not present

## 2020-05-25 DIAGNOSIS — C44519 Basal cell carcinoma of skin of other part of trunk: Secondary | ICD-10-CM

## 2020-05-25 NOTE — Patient Instructions (Addendum)

## 2020-05-25 NOTE — Progress Notes (Signed)
Follow-Up Visit   Subjective  George Schmidt is a 70 y.o. male who presents for the following: Superficial BCC  (L upper back lateral - patient is here today for [facility] ), superficial BCC (L shoulder posterior adjacent to white patch - patient is here for [facility] today ), and superficial BCC  (Patient is here today for [facility] ) as well as treatment of biopsy proven wart.  The following portions of the chart were reviewed this encounter and updated as appropriate:   Tobacco  Allergies  Meds  Problems  Med Hx  Surg Hx  Fam Hx     Review of Systems:  No other skin or systemic complaints except as noted in HPI or Assessment and Plan.  Objective  Well appearing patient in no apparent distress; mood and affect are within normal limits.  A focused examination was performed including the back, shoulders, right arm. Relevant physical exam findings are noted in the Assessment and Plan.  Objective  L upper back lateral: Pink pearly papule or plaque with arborizing vessels.   Objective  L shoulder post adjacent to white patch: Pink pearly papule or plaque with arborizing vessels.   Objective  L upper medial back: Pink pearly papule or plaque with arborizing vessels.   Objective  R forearm: Verrucous papules, biopsy proven  Assessment & Plan  Basal cell carcinoma (BCC) of skin of other part of torso (3) L upper back lateral  Destruction of lesion Complexity: extensive   Destruction method: electrodesiccation and curettage   Informed consent: discussed and consent obtained   Timeout:  patient name, date of birth, surgical site, and procedure verified Procedure prep:  Patient was prepped and draped in usual sterile fashion Prep type:  Isopropyl alcohol Anesthesia: the lesion was anesthetized in a standard fashion   Anesthetic:  1% lidocaine w/ epinephrine 1-100,000 buffered w/ 8.4% NaHCO3 Curettage performed in three different directions: Yes   Electrodesiccation performed over  the curetted area: Yes   Final wound size (cm):  1.3 Hemostasis achieved with:  pressure, aluminum chloride and electrodesiccation Outcome: patient tolerated procedure well with no complications   Post-procedure details: sterile dressing applied and wound care instructions given   Dressing type: bandage and petrolatum    L shoulder post adjacent to white patch  Destruction of lesion Complexity: extensive   Destruction method: electrodesiccation and curettage   Informed consent: discussed and consent obtained   Timeout:  patient name, date of birth, surgical site, and procedure verified Procedure prep:  Patient was prepped and draped in usual sterile fashion Prep type:  Isopropyl alcohol Anesthesia: the lesion was anesthetized in a standard fashion   Anesthetic:  1% lidocaine w/ epinephrine 1-100,000 buffered w/ 8.4% NaHCO3 Curettage performed in three different directions: Yes   Electrodesiccation performed over the curetted area: Yes   Final wound size (cm):  1.4 Hemostasis achieved with:  pressure, aluminum chloride and electrodesiccation Outcome: patient tolerated procedure well with no complications   Post-procedure details: sterile dressing applied and wound care instructions given   Dressing type: bandage and petrolatum    L upper medial back  Destruction of lesion Complexity: extensive   Destruction method: electrodesiccation and curettage   Informed consent: discussed and consent obtained   Timeout:  patient name, date of birth, surgical site, and procedure verified Procedure prep:  Patient was prepped and draped in usual sterile fashion Prep type:  Isopropyl alcohol Anesthesia: the lesion was anesthetized in a standard fashion   Anesthetic:  1% lidocaine  w/ epinephrine 1-100,000 buffered w/ 8.4% NaHCO3 Curettage performed in three different directions: Yes   Electrodesiccation performed over the curetted area: Yes   Final wound size (cm):  1.6 Hemostasis achieved  with:  pressure, aluminum chloride and electrodesiccation Outcome: patient tolerated procedure well with no complications   Post-procedure details: sterile dressing applied and wound care instructions given   Dressing type: bandage and petrolatum    Viral warts, unspecified type R forearm  Biopsy proven   Discussed viral etiology and risk of spread.  Discussed multiple treatments may be required to clear warts.  Discussed possible post-treatment dyspigmentation and risk of recurrence.  Prior to procedure, discussed risks of blister formation, small wound, skin dyspigmentation, or rare scar following cryotherapy.    Destruction of lesion - R forearm Complexity: simple   Destruction method: cryotherapy   Informed consent: discussed and consent obtained   Timeout:  patient name, date of birth, surgical site, and procedure verified Lesion destroyed using liquid nitrogen: Yes   Region frozen until ice ball extended beyond lesion: Yes   Outcome: patient tolerated procedure well with no complications   Post-procedure details: wound care instructions given     Return for appointment as scheduled.  Luther Redo, CMA, am acting as scribe for Forest Gleason, MD .  Documentation: I have reviewed the above documentation for accuracy and completeness, and I agree with the above.  Forest Gleason, MD

## 2020-06-05 ENCOUNTER — Telehealth: Payer: Self-pay | Admitting: Dermatology

## 2020-06-05 ENCOUNTER — Encounter: Payer: Self-pay | Admitting: Dermatology

## 2020-06-05 NOTE — Telephone Encounter (Signed)
Please call patient and recommend Nicotinamide 500mg  twice per day to lower risk of non-melanoma skin cancer by approximately 25%.   If he is already taking it, please add to med list. Thank you!

## 2020-06-05 NOTE — Telephone Encounter (Signed)
Called patient and discussed provider's recommendations. Patient and his wife verbalized understanding and denied further questions at this time. Patient states he will need time to think about starting medication at this time. Denies any questions.

## 2020-07-05 ENCOUNTER — Ambulatory Visit: Payer: Medicare Other | Admitting: Dermatology

## 2020-10-26 ENCOUNTER — Ambulatory Visit: Payer: Medicare Other | Admitting: Dermatology

## 2020-10-26 ENCOUNTER — Encounter: Payer: Self-pay | Admitting: Dermatology

## 2020-10-26 ENCOUNTER — Other Ambulatory Visit: Payer: Self-pay

## 2020-10-26 DIAGNOSIS — D229 Melanocytic nevi, unspecified: Secondary | ICD-10-CM

## 2020-10-26 DIAGNOSIS — L578 Other skin changes due to chronic exposure to nonionizing radiation: Secondary | ICD-10-CM

## 2020-10-26 DIAGNOSIS — L821 Other seborrheic keratosis: Secondary | ICD-10-CM

## 2020-10-26 DIAGNOSIS — Z85828 Personal history of other malignant neoplasm of skin: Secondary | ICD-10-CM

## 2020-10-26 DIAGNOSIS — L814 Other melanin hyperpigmentation: Secondary | ICD-10-CM

## 2020-10-26 DIAGNOSIS — B353 Tinea pedis: Secondary | ICD-10-CM

## 2020-10-26 DIAGNOSIS — D1800 Hemangioma unspecified site: Secondary | ICD-10-CM

## 2020-10-26 DIAGNOSIS — D485 Neoplasm of uncertain behavior of skin: Secondary | ICD-10-CM

## 2020-10-26 DIAGNOSIS — L219 Seborrheic dermatitis, unspecified: Secondary | ICD-10-CM

## 2020-10-26 DIAGNOSIS — Z1283 Encounter for screening for malignant neoplasm of skin: Secondary | ICD-10-CM

## 2020-10-26 DIAGNOSIS — L570 Actinic keratosis: Secondary | ICD-10-CM | POA: Diagnosis not present

## 2020-10-26 MED ORDER — MUPIROCIN 2 % EX OINT: 1.0000 "application " | TOPICAL_OINTMENT | Freq: Every day | CUTANEOUS | 0 refills | Status: DC

## 2020-10-26 NOTE — Progress Notes (Signed)
Follow-Up Visit   Subjective  George Schmidt is a 69 y.o. male who presents for the following: FBSE (Patient here for full body skin exam and skin cancer screening. Patient does have hx of SCC and BCC. He is not aware of any new or changing spots. ).  He does have a spot at the groin area he would like checked.  The following portions of the chart were reviewed this encounter and updated as appropriate:   Tobacco  Allergies  Meds  Problems  Med Hx  Surg Hx  Fam Hx       Review of Systems:  No other skin or systemic complaints except as noted in HPI or Assessment and Plan.  Objective  Well appearing patient in no apparent distress; mood and affect are within normal limits.  A full examination was performed including scalp, head, eyes, ears, nose, lips, neck, chest, axillae, abdomen, back, buttocks, bilateral upper extremities, bilateral lower extremities, hands, feet, fingers, toes, fingernails, and toenails. All findings within normal limits unless otherwise noted below.  left tragus x 1, left medial cheek x 1, right antihelix x 1, right tragus x 1 (4) Erythematous thin papules/macules with gritty scale.   scalp and beard area Pink patches with greasy scale.   Pubic Pink plaque adjacent to scrotum  Right Foot - Anterior Scaling and maceration web spaces and over distal and lateral soles.    Assessment & Plan  AK (actinic keratosis) (4) left tragus x 1, left medial cheek x 1, right antihelix x 1, right tragus x 1  Prior to procedure, discussed risks of blister formation, small wound, skin dyspigmentation, or rare scar following cryotherapy. Recommend Vaseline ointment to treated areas while healing.  Actinic keratoses are precancerous spots that appear secondary to cumulative UV radiation exposure/sun exposure over time. They are chronic with expected duration over 1 year. A portion of actinic keratoses will progress to squamous cell carcinoma of the skin. It is not  possible to reliably predict which spots will progress to skin cancer and so treatment is recommended to prevent development of skin cancer.  Recommend daily broad spectrum sunscreen SPF 30+ to sun-exposed areas, reapply every 2 hours as needed.  Recommend staying in the shade or wearing long sleeves, sun glasses (UVA+UVB protection) and wide brim hats (4-inch brim around the entire circumference of the hat). Call for new or changing lesions.   Destruction of lesion - left tragus x 1, left medial cheek x 1, right antihelix x 1, right tragus x 1  Destruction method: cryotherapy   Informed consent: discussed and consent obtained   Lesion destroyed using liquid nitrogen: Yes   Cryotherapy cycles:  2 Outcome: patient tolerated procedure well with no complications   Post-procedure details: wound care instructions given    Seborrheic dermatitis scalp and beard area  Not bothersome for patient.  Patient defers treatment. Will continue at home treatment with [facility].  Neoplasm of uncertain behavior of skin Pubic  Destruction of lesion  Destruction method: cryotherapy   Informed consent: discussed and consent obtained   Lesion destroyed using liquid nitrogen: Yes   Cryotherapy cycles:  2 Outcome: patient tolerated procedure well with no complications   Post-procedure details: wound care instructions given    mupirocin ointment (BACTROBAN) 2 % Apply 1 application topically daily.  Favor genital wart > SK  Discussed viral etiology and risk of spread.  Discussed multiple treatments may be required to clear warts.  Discussed possible post-treatment dyspigmentation and risk  of recurrence.  Discussed genital warts are sexually transmitted and can be transmitted to partner. Recommend partner stay up to date on pap smears.   Tinea pedis of both feet Right Foot - Anterior  Recommend OTC antifungal twice daily until clear then once weekly for maintenance.   History of Basal Cell  Carcinoma of the Skin - No evidence of recurrence today - Recommend regular full body skin exams - Recommend daily broad spectrum sunscreen SPF 30+ to sun-exposed areas, reapply every 2 hours as needed.  - Call if any new or changing lesions are noted between office visits  History of Squamous Cell Carcinoma of the Skin - No evidence of recurrence today - No lymphadenopathy - Recommend regular full body skin exams - Recommend daily broad spectrum sunscreen SPF 30+ to sun-exposed areas, reapply every 2 hours as needed.  - Call if any new or changing lesions are noted between office visits  Lentigines - Scattered tan macules - Due to sun exposure - Benign-appering, observe - Recommend daily broad spectrum sunscreen SPF 30+ to sun-exposed areas, reapply every 2 hours as needed. - Call for any changes  Seborrheic Keratoses - Stuck-on, waxy, tan-brown papules and/or plaques  - Benign-appearing - Discussed benign etiology and prognosis. - Observe - Call for any changes  Melanocytic Nevi - Tan-brown and/or pink-flesh-colored symmetric macules and papules - Benign appearing on exam today - Observation - Call clinic for new or changing moles - Recommend daily use of broad spectrum spf 30+ sunscreen to sun-exposed areas.   Hemangiomas - Red papules - Discussed benign nature - Observe - Call for any changes  Actinic Damage - Chronic condition, secondary to cumulative UV/sun exposure - diffuse scaly erythematous macules with underlying dyspigmentation - Recommend daily broad spectrum sunscreen SPF 30+ to sun-exposed areas, reapply every 2 hours as needed.  - Staying in the shade or wearing long sleeves, sun glasses (UVA+UVB protection) and wide brim hats (4-inch brim around the entire circumference of the hat) are also recommended for sun protection.  - Call for new or changing lesions.  Skin cancer screening performed today.  Return in about 2 months (around 12/26/2020) for wart  follow up, 6 month FBSE.  Graciella Belton, RMA, am acting as scribe for Forest Gleason, MD .  Documentation: I have reviewed the above documentation for accuracy and completeness, and I agree with the above.  Forest Gleason, MD

## 2020-10-26 NOTE — Patient Instructions (Addendum)
Cryotherapy Aftercare  Wash gently with soap and water everyday.   Apply Vaseline and Band-Aid daily until healed.   Prior to procedure, discussed risks of blister formation, small wound, skin dyspigmentation, or rare scar following cryotherapy. Recommend Vaseline ointment to treated areas while healing.   Recommend over the counter antifungal twice daily until clear then once weekly for maintenance.   Melanoma ABCDEs  Melanoma is the most dangerous type of skin cancer, and is the leading cause of death from skin disease.  You are more likely to develop melanoma if you: Have light-colored skin, light-colored eyes, or red or blond hair Spend a lot of time in the sun Tan regularly, either outdoors or in a tanning bed Have had blistering sunburns, especially during childhood Have a close family member who has had a melanoma Have atypical moles or large birthmarks  Early detection of melanoma is key since treatment is typically straightforward and cure rates are extremely high if we catch it early.   The first sign of melanoma is often a change in a mole or a new dark spot.  The ABCDE system is a way of remembering the signs of melanoma.  A for asymmetry:  The two halves do not match. B for border:  The edges of the growth are irregular. C for color:  A mixture of colors are present instead of an even brown color. D for diameter:  Melanomas are usually (but not always) greater than 16mm - the size of a pencil eraser. E for evolution:  The spot keeps changing in size, shape, and color.  Please check your skin once per month between visits. You can use a small mirror in front and a large mirror behind you to keep an eye on the back side or your body.   If you see any new or changing lesions before your next follow-up, please call to schedule a visit.  Please continue daily skin protection including broad spectrum sunscreen SPF 30+ to sun-exposed areas, reapplying every 2 hours as needed  when you're outdoors.

## 2020-11-08 ENCOUNTER — Encounter: Payer: Self-pay | Admitting: Dermatology

## 2021-01-10 ENCOUNTER — Other Ambulatory Visit: Payer: Self-pay

## 2021-01-10 ENCOUNTER — Encounter: Payer: Self-pay | Admitting: Dermatology

## 2021-01-10 ENCOUNTER — Ambulatory Visit: Payer: Medicare Other | Admitting: Dermatology

## 2021-01-10 DIAGNOSIS — B078 Other viral warts: Secondary | ICD-10-CM

## 2021-01-10 NOTE — Patient Instructions (Signed)
Cryotherapy  Cryotherapy is the treatment of lesions with the application of a cold substance.  In most cases, liquid nitrogen is used to destroy the lesion(s).  Liquid nitrogen is so cold, -196 Celsius, it feels like it is burning when it is applied.  After treatment with liquid nitrogen, there may be some burning sensation or pain that can last up to 24 hours.  The area may also be swollen and red.  The discomfort can be relieved with ibuprofen (Advil, Motrin), acetaminophen (Extra Strength Tylenol), or similar pain relief medication.  Within 24 to 48 hours, a blister may form.  Occasionally, these blisters will become filled with blood and become very dark.  This is no cause for concern.  The blister will gradually dry up over a period of several days, eventually separating from the healing skin below in about one to two weeks.  The surrounding skin will become red and the area may become itchy.  This is all part of the normal healing process.  Occasionally, the crusts will last as long as four weeks when certain deeper spots on the skin are treated.  Sometimes a permanent white mark or scar will be left after healing.  You may continue all of your normal activities as long as they do not cause pain in the treated areas.  It is okay to get the area wet.  After therapy or if the blister is still intact - You may treat it like normal skin.  Covering the area with a bandage may offer some comfort and protection from trauma but is not absolutely necessary.  If the blister is uncomfortable - Clean a small needle with rubbing alcohol, then gently make a small hole in the side of the blister to drain the blister fluid.  This often gives immediate relief.  Do not remove the blister roof, as the blister aids in healing.  In time it will fall off on its own.   Once the blister roof falls off or a sore forms -  Clean the blister sites with soap and water.  Rinse the area and pat dry.  Do not force off the  blister roof or crust. Apply an antibiotic ointment such as Polysporin or Bacitracin. A bandage may be applied loosely over the blister until it is healed if desired. Call our office if you are concerned it may be infected.  Some redness, itching and oozing is part of the normal healing process.  Signs of infection include increasing redness, increasing pain, swelling, heat, or yellow discharge. If you have any questions or concerns for your doctor, please call our main line at [phone] and press option 4 to reach your doctor's medical assistant. If no one answers, please leave a voicemail as directed and we will return your call as soon as possible. Messages left after 4 pm will be answered the following business day.   You may also send [place] a message via [place]. We typically respond to MyChart messages within 1-2 business days.  For prescription refills, please ask your pharmacy to contact our office. Our fax number is [phone].  If you have an urgent issue when the clinic is closed that cannot wait until the next business day, you can page your doctor at the number below.    Please note that while we do our best to be available for urgent issues outside of office hours, we are not available 24/7.   If you have an urgent issue and are unable to  reach [place], you may choose to seek medical care at your doctor's office, retail clinic, urgent care center, or emergency room.  If you have a medical emergency, please immediately call 911 or go to the emergency department.  Pager Numbers  - Dr. Nehemiah Massed: [phone]  - Dr. Laurence Ferrari: [phone]  - Dr. Nicole Kindred: [phone]  In the event of inclement weather, please call our main line at [phone] for an update on the status of any delays or closures.  Dermatology Medication Tips: Please keep the boxes that topical medications come in in order to help keep track of the instructions about where and how to use these. Pharmacies typically  print the medication instructions only on the boxes and not directly on the medication tubes.   If your medication is too expensive, please contact our office at [phone] option 4 or send [place] a message through [place].   We are unable to tell what your co-pay for medications will be in advance as this is different depending on your insurance coverage. However, we may be able to find a substitute medication at lower cost or fill out paperwork to get insurance to cover a needed medication.   If a prior authorization is required to get your medication covered by your insurance company, please allow [place] 1-2 business days to complete this process.  Drug prices often vary depending on where the prescription is filled and some pharmacies may offer cheaper prices.  The website www.goodrx.com contains coupons for medications through different pharmacies. The prices here do not account for what the cost may be with help from insurance (it may be cheaper with your insurance), but the website can give you the price if you did not use any insurance.  - You can print the associated coupon and take it with your prescription to the pharmacy.  - You may also stop by our office during regular business hours and pick up a GoodRx coupon card.  - If you need your prescription sent electronically to a different pharmacy, notify our office through [facility] or by phone at [phone] option 4.

## 2021-01-10 NOTE — Progress Notes (Signed)
   Follow-Up Visit   Subjective  George Schmidt is a 70 y.o. male who presents for the following: Warts (2 month recheck. Tx with LN2. Patient reports area did not blister after Tx and is still there. ).   The following portions of the chart were reviewed this encounter and updated as appropriate:  Tobacco  Allergies  Meds  Problems  Med Hx  Surg Hx  Fam Hx      Review of Systems: No other skin or systemic complaints except as noted in HPI or Assessment and Plan.   Objective  Well appearing patient in no apparent distress; mood and affect are within normal limits.  A focused examination was performed including face, groin. Relevant physical exam findings are noted in the Assessment and Plan.  Pubic Pink plaque adjacent to scrotum with central clearing  Assessment & Plan  Other viral warts Pubic  Vs ISK  Discussed viral etiology and risk of spread.  Discussed multiple treatments may be required to clear warts.  Discussed possible post-treatment dyspigmentation and risk of recurrence.  Improvement since last LN2, primarily at central portion  Destruction of lesion - Pubic  Destruction method: cryotherapy   Informed consent: discussed and consent obtained   Lesion destroyed using liquid nitrogen: Yes   Outcome: patient tolerated procedure well with no complications   Post-procedure details: wound care instructions given    Return for wart recheck in 6 weeks.  I, Emelia Salisbury, CMA, am acting as scribe for Forest Gleason, MD.  Documentation: I have reviewed the above documentation for accuracy and completeness, and I agree with the above.  Forest Gleason, MD

## 2021-04-23 ENCOUNTER — Other Ambulatory Visit: Payer: Self-pay | Admitting: Orthopedic Surgery

## 2021-04-23 DIAGNOSIS — M1712 Unilateral primary osteoarthritis, left knee: Secondary | ICD-10-CM

## 2021-05-09 ENCOUNTER — Other Ambulatory Visit: Payer: Self-pay

## 2021-05-09 ENCOUNTER — Ambulatory Visit: Payer: Medicare Other | Admitting: Dermatology

## 2021-05-09 DIAGNOSIS — Z85828 Personal history of other malignant neoplasm of skin: Secondary | ICD-10-CM

## 2021-05-09 DIAGNOSIS — L570 Actinic keratosis: Secondary | ICD-10-CM | POA: Diagnosis not present

## 2021-05-09 DIAGNOSIS — B353 Tinea pedis: Secondary | ICD-10-CM

## 2021-05-09 DIAGNOSIS — L308 Other specified dermatitis: Secondary | ICD-10-CM

## 2021-05-09 DIAGNOSIS — D229 Melanocytic nevi, unspecified: Secondary | ICD-10-CM

## 2021-05-09 DIAGNOSIS — L578 Other skin changes due to chronic exposure to nonionizing radiation: Secondary | ICD-10-CM

## 2021-05-09 DIAGNOSIS — D1800 Hemangioma unspecified site: Secondary | ICD-10-CM

## 2021-05-09 DIAGNOSIS — L720 Epidermal cyst: Secondary | ICD-10-CM

## 2021-05-09 DIAGNOSIS — B078 Other viral warts: Secondary | ICD-10-CM | POA: Diagnosis not present

## 2021-05-09 DIAGNOSIS — Z1283 Encounter for screening for malignant neoplasm of skin: Secondary | ICD-10-CM

## 2021-05-09 DIAGNOSIS — L821 Other seborrheic keratosis: Secondary | ICD-10-CM

## 2021-05-09 DIAGNOSIS — L814 Other melanin hyperpigmentation: Secondary | ICD-10-CM

## 2021-05-09 NOTE — Patient Instructions (Addendum)
Cryotherapy Aftercare  Wash gently with soap and water everyday.   Apply Vaseline and Band-Aid daily until healed.   Prior to procedure, discussed risks of blister formation, small wound, skin dyspigmentation, or rare scar following cryotherapy. Recommend Vaseline ointment to treated areas while healing.  Recommend OTC Gold Bond Rapid Relief Anti-Itch cream (pramoxine + menthol), CeraVe Anti-itch cream or lotion (pramoxine), Sarna lotion (Original- menthol + camphor or Sensitive- pramoxine) or Eucerin 12 hour Itch Relief lotion (menthol) up to 3 times per day to areas on body that are itchy.  5-Fluorouracil/Calcipotriene Patient Education   Actinic keratoses are the dry, red scaly spots on the skin caused by sun damage. A portion of these spots can turn into skin cancer with time, and treating them can help prevent development of skin cancer.   Treatment of these spots requires removal of the defective skin cells. There are various ways to remove actinic keratoses, including freezing with liquid nitrogen, treatment with creams, or treatment with a blue light procedure in the office.   5-fluorouracil cream is a topical cream used to treat actinic keratoses. It works by interfering with the growth of abnormal fast-growing skin cells, such as actinic keratoses. These cells peel off and are replaced by healthy ones.   5-fluorouracil/calcipotriene is a combination of the 5-fluorouracil cream with a vitamin D analog cream called calcipotriene. The calcipotriene alone does not treat actinic keratoses. However, when it is combined with 5-fluorouracil, it helps the 5-fluorouracil treat the actinic keratoses much faster so that the same results can be achieved with a much shorter treatment time.  INSTRUCTIONS FOR 5-FLUOROURACIL/CALCIPOTRIENE CREAM:   5-fluorouracil/calcipotriene cream typically only needs to be used for 4-7 days. A thin layer should be applied twice a day to the treatment areas  recommended by your physician.   If your physician prescribed you separate tubes of 5-fluourouracil and calcipotriene, apply a thin layer of 5-fluorouracil followed by a thin layer of calcipotriene.   Avoid contact with your eyes, nostrils, and mouth. Do not use 5-fluorouracil/calcipotriene cream on infected or open wounds.   You will develop redness, irritation and some crusting at areas where you have pre-cancer damage/actinic keratoses. IF YOU DEVELOP PAIN, BLEEDING, OR SIGNIFICANT CRUSTING, STOP THE TREATMENT EARLY - you have already gotten a good response and the actinic keratoses should clear up well.  Wash your hands after applying 5-fluorouracil 5% cream on your skin.   A moisturizer or sunscreen with a minimum SPF 30 should be applied each morning.   Once you have finished the treatment, you can apply a thin layer of Vaseline twice a day to irritated areas to soothe and calm the areas more quickly. If you experience significant discomfort, contact your physician.  For some patients it is necessary to repeat the treatment for best results.  SIDE EFFECTS: When using 5-fluorouracil/calcipotriene cream, you may have mild irritation, such as redness, dryness, swelling, or a mild burning sensation. This usually resolves within 2 weeks. The more actinic keratoses you have, the more redness and inflammation you can expect during treatment. Eye irritation has been reported rarely. If this occurs, please let us know.  If you have any trouble using this cream, please call the office. If you have any other questions about this information, please do not hesitate to ask me before you leave the office.  - Start 5-fluorouracil/calcipotriene cream twice a day for 4 days to affected areas including face. Prescription sent to [facility]. Patient advised they will receive an  email to purchase the medication online and have it sent to their home. Patient provided with handout  reviewing treatment course and side effects and advised to call or message [place] on MyChart with any concerns.  Instructions for [facility] Medications  One or more of your medications was sent to the [facility] mail order compound[facility] pharmacy. You will receive an email from them and can purchase the medicine through that link. It will then be mailed to your home at the address you confirmed. If for any reason you do not receive an email from them, please check your spam folder. If you still do not find the email, please let us know. [facility] phone number is [phone].  Melanoma ABCDEs  Melanoma is the most dangerous type of skin cancer, and is the leading cause of death from skin disease.  You are more likely to develop melanoma if you: Have light-colored skin, light-colored eyes, or red or blond hair Spend a lot of time in the sun Tan regularly, either outdoors or in a tanning bed Have had blistering sunburns, especially during childhood Have a close family member who has had a melanoma Have atypical moles or large birthmarks  Early detection of melanoma is key since treatment is typically straightforward and cure rates are extremely high if we catch it early.   The first sign of melanoma is often a change in a mole or a new dark spot.  The ABCDE system is a way of remembering the signs of melanoma.  A for asymmetry:  The two halves do not match. B for border:  The edges of the growth are irregular. C for color:  A mixture of colors are present instead of an even brown color. D for diameter:  Melanomas are usually (but not always) greater than 60mm - the size of a pencil eraser. E for evolution:  The spot keeps changing in size, shape, and color.  Please check your skin once per month between visits. You can use a small mirror in front and a large mirror behind you to keep an eye on the back side or your body.   If you see any new or changing lesions before your next  follow-up, please call to schedule a visit.  Please continue daily skin protection including broad spectrum sunscreen SPF 30+ to sun-exposed areas, reapplying every 2 hours as needed when you're outdoors.    If You Need Anything After Your Visit  If you have any questions or concerns for your doctor, please call our main line at [phone] and press option 4 to reach your doctor's medical assistant. If no one answers, please leave a voicemail as directed and we will return your call as soon as possible. Messages left after 4 pm will be answered the following business day.   You may also send [place] a message via [place]. We typically respond to MyChart messages within 1-2 business days.  For prescription refills, please ask your pharmacy to contact our office. Our fax number is [phone].  If you have an urgent issue when the clinic is closed that cannot wait until the next business day, you can page your doctor at the number below.    Please note that while we do our best to be available for urgent issues outside of office hours, we are not available 24/7.   If you have an urgent issue and are unable to reach [place], you may choose to seek medical care at your doctor's office, retail  clinic, urgent care center, or emergency room.  If you have a medical emergency, please immediately call 911 or go to the emergency department.  Pager Numbers  - Dr. Nehemiah Massed: [phone]  - Dr. Laurence Ferrari: [phone]  - Dr. Nicole Kindred: [phone]  In the event of inclement weather, please call our main line at [phone] for an update on the status of any delays or closures.  Dermatology Medication Tips: Please keep the boxes that topical medications come in in order to help keep track of the instructions about where and how to use these. Pharmacies typically print the medication instructions only on the boxes and not directly on the medication tubes.   If your medication is too expensive, please contact  our office at [phone] option 4 or send [place] a message through [place].   We are unable to tell what your co-pay for medications will be in advance as this is different depending on your insurance coverage. However, we may be able to find a substitute medication at lower cost or fill out paperwork to get insurance to cover a needed medication.   If a prior authorization is required to get your medication covered by your insurance company, please allow [place] 1-2 business days to complete this process.  Drug prices often vary depending on where the prescription is filled and some pharmacies may offer cheaper prices.  The website www.goodrx.com contains coupons for medications through different pharmacies. The prices here do not account for what the cost may be with help from insurance (it may be cheaper with your insurance), but the website can give you the price if you did not use any insurance.  - You can print the associated coupon and take it with your prescription to the pharmacy.  - You may also stop by our office during regular business hours and pick up a GoodRx coupon card.  - If you need your prescription sent electronically to a different pharmacy, notify our office through [facility] or by phone at [phone] option 4.     Si Usted Necesita Algo Despus de Su Visita  Tambin puede enviarnos un mensaje a travs de Pharmacist, community. Por lo general respondemos a los mensajes de MyChart en el transcurso de 1 a 2 das hbiles.  Para renovar recetas, por favor pida a su farmacia que se ponga en contacto con nuestra oficina. Harland Dingwall de fax es [place] [phone].  Si tiene un asunto urgente cuando la clnica est cerrada y que no puede esperar hasta el siguiente da hbil, puede llamar/localizar a su doctor(a) al nmero que aparece a continuacin.   Por favor, tenga en cuenta que aunque hacemos todo lo posible para estar disponibles para asuntos urgentes fuera del horario de [place],  no estamos disponibles las 24 horas del da, los 7 das de la [place].   Si tiene un problema urgente y no puede comunicarse con nosotros, puede optar por buscar atencin mdica  en el consultorio de su doctor(a), en una clnica privada, en un centro de atencin urgente o en una sala de emergencias.  Si tiene Engineering geologist, por favor llame inmediatamente al 911 o vaya a la sala de emergencias.  Nmeros de bper  - Dr. Nehemiah Massed: [phone]  - Dra. Moye: [phone]  - Dra. Nicole Kindred: [phone]  En caso de inclemencias del [place], por favor llame a Johnsie Kindred principal al [phone] para una actualizacin sobre el [place] de cualquier retraso o cierre.  Consejos para la medicacin en dermatologa: Por favor, guarde las  cajas en las que vienen los medicamentos de uso tpico para ayudarle a seguir las instrucciones sobre dnde y cmo usarlos. Las farmacias generalmente imprimen las instrucciones del medicamento slo en las cajas y no directamente en los tubos del [place].   Si su medicamento es muy caro, por favor, pngase en contacto con Zigmund Daniel llamando al [phone] y presione la opcin 4 o envenos un mensaje a travs de Pharmacist, community.   No podemos decirle cul ser su copago por los medicamentos por adelantado ya que esto es diferente dependiendo de la cobertura de su seguro. Sin embargo, es posible que podamos encontrar un medicamento sustituto a Electrical engineer un formulario para que el seguro cubra el medicamento que se considera necesario.   Si se requiere una autorizacin previa para que su compaa de seguros [place] su medicamento, por favor permtanos de 1 a 2 das hbiles para completar este proceso.  Los precios de los medicamentos varan con frecuencia dependiendo del Environmental consultant de dnde se surte la receta y alguna farmacias pueden ofrecer precios ms baratos.  El sitio web www.goodrx.com tiene cupones para medicamentos de Airline pilot. Los precios  aqu no tienen en cuenta lo que podra costar con la ayuda del seguro (puede ser ms barato con su seguro), pero el sitio web puede darle el precio si no utiliz Research scientist (physical sciences).  - Puede imprimir el cupn correspondiente y llevarlo con su receta a la farmacia.  - Tambin puede pasar por nuestra oficina durante el horario de atencin regular y Charity fundraiser una tarjeta de cupones de GoodRx.  - Si necesita que su receta se enve electrnicamente a una farmacia diferente, informe a nuestra oficina a travs de MyChart de [place] o por telfono llamando al [phone] y presione la opcin 4.

## 2021-05-09 NOTE — Progress Notes (Signed)
Follow-Up Visit   Subjective  George Schmidt is a 70 y.o. male who presents for the following: FBSE (Patient here for full body skin exam and skin cancer screening. Patient with hx of SCC and BCC. Patient is not aware of any new or changing spots. ).  Patient does have a wart at groin that is being treated with LN2.   The following portions of the chart were reviewed this encounter and updated as appropriate:   Tobacco   Allergies   Meds   Problems   Med Hx   Surg Hx   Fam Hx       Review of Systems:  No other skin or systemic complaints except as noted in HPI or Assessment and Plan.  Objective  Well appearing patient in no apparent distress; mood and affect are within normal limits.  A full examination was performed including scalp, head, eyes, ears, nose, lips, neck, chest, axillae, abdomen, back, buttocks, bilateral upper extremities, bilateral lower extremities, hands, feet, fingers, toes, fingernails, and toenails. All findings within normal limits unless otherwise noted below.  Pubic Verrucous papules -- Discussed viral etiology and contagion.   Left Forearm x 4, left antecubital fossa x 1 (5) Erythematous thin papules/macules with gritty scale.   bilateral lower legs Scaly pink papules coalescing to plaques   bilateral feet Scaling and maceration web spaces and over distal and lateral soles.   posterior neck, left upper back 1.4 cm subcutaneous nodule at posterior neck     Assessment & Plan  Other viral warts Pubic  Discussed viral etiology and risk of spread.  Discussed multiple treatments may be required to clear warts.  Discussed possible post-treatment dyspigmentation and risk of recurrence.  Prior to procedure, discussed risks of blister formation, small wound, skin dyspigmentation, or rare scar following cryotherapy. Recommend Vaseline ointment to treated areas while healing.   Destruction of lesion - Pubic  Destruction method: cryotherapy    Informed consent: discussed and consent obtained   Lesion destroyed using liquid nitrogen: Yes   Cryotherapy cycles:  2 Outcome: patient tolerated procedure well with no complications   Post-procedure details: wound care instructions given    AK (actinic keratosis) (5) Left Forearm x 4, left antecubital fossa x 1  Prior to procedure, discussed risks of blister formation, small wound, skin dyspigmentation, or rare scar following cryotherapy. Recommend Vaseline ointment to treated areas while healing.  Actinic keratoses are precancerous spots that appear secondary to cumulative UV radiation exposure/sun exposure over time. They are chronic with expected duration over 1 year. A portion of actinic keratoses will progress to squamous cell carcinoma of the skin. It is not possible to reliably predict which spots will progress to skin cancer and so treatment is recommended to prevent development of skin cancer.  Recommend daily broad spectrum sunscreen SPF 30+ to sun-exposed areas, reapply every 2 hours as needed.  Recommend staying in the shade or wearing long sleeves, sun glasses (UVA+UVB protection) and wide brim hats (4-inch brim around the entire circumference of the hat). Call for new or changing lesions.   Destruction of lesion - Left Forearm x 4, left antecubital fossa x 1  Destruction method: cryotherapy   Informed consent: discussed and consent obtained   Lesion destroyed using liquid nitrogen: Yes   Cryotherapy cycles:  2 Outcome: patient tolerated procedure well with no complications   Post-procedure details: wound care instructions given    Other eczema bilateral lower legs  Patient defers treatment.  Recommend OTC Gold Bond  Rapid Relief Anti-Itch cream (pramoxine + menthol), CeraVe Anti-itch cream or lotion (pramoxine), Sarna lotion (Original- menthol + camphor or Sensitive- pramoxine) or Eucerin 12 hour Itch Relief lotion (menthol) up to 3 times per day to areas on body that  are itchy.   Tinea pedis of both feet bilateral feet  Patient defers treatment.  Recommend patient use over the counter terbinafine if he develops skin breakdown between toes  Epidermal inclusion cyst posterior neck, left upper back  Benign-appearing. Exam most consistent with an epidermal inclusion cyst. Discussed that a cyst is a benign growth that can grow over time and sometimes get irritated or inflamed. Recommend observation if it is not bothersome. Discussed option of surgical excision to remove it if it is growing, symptomatic, or other changes noted. Please call for new or changing lesions so they can be evaluated.     Lentigines - Scattered tan macules - Due to sun exposure - Benign-appearing, observe - Recommend daily broad spectrum sunscreen SPF 30+ to sun-exposed areas, reapply every 2 hours as needed. - Call for any changes  Seborrheic Keratoses - Stuck-on, waxy, tan-brown papules and/or plaques  - Benign-appearing - Discussed benign etiology and prognosis. - Observe - Call for any changes  Melanocytic Nevi - Tan-brown and/or pink-flesh-colored symmetric macules and papules - Benign appearing on exam today - Observation - Call clinic for new or changing moles - Recommend daily use of broad spectrum spf 30+ sunscreen to sun-exposed areas.   Hemangiomas - Red papules - Discussed benign nature - Observe - Call for any changes  Actinic Damage - Severe, confluent actinic changes with pre-cancerous actinic keratoses  - Severe, chronic, not at goal, secondary to cumulative UV radiation exposure over time - diffuse scaly erythematous macules and papules with underlying dyspigmentation - Discussed Prescription "Field Treatment" for Severe, Chronic Confluent Actinic Changes with Pre-Cancerous Actinic Keratoses Field treatment involves treatment of an entire area of skin that has confluent Actinic Changes (Sun/ Ultraviolet light damage) and PreCancerous Actinic  Keratoses by method of PhotoDynamic Therapy (PDT) and/or prescription Topical Chemotherapy agents such as 5-fluorouracil, 5-fluorouracil/calcipotriene, and/or imiquimod.  The purpose is to decrease the number of clinically evident and subclinical PreCancerous lesions to prevent progression to development of skin cancer by chemically destroying early precancer changes that may or may not be visible.  It has been shown to reduce the risk of developing skin cancer in the treated area. As a result of treatment, redness, scaling, crusting, and open sores may occur during treatment course. One or more than one of these methods may be used and may have to be used several times to control, suppress and eliminate the PreCancerous changes. Discussed treatment course, expected reaction, and possible side effects. - Recommend daily broad spectrum sunscreen SPF 30+ to sun-exposed areas, reapply every 2 hours as needed.  - Staying in the shade or wearing long sleeves, sun glasses (UVA+UVB protection) and wide brim hats (4-inch brim around the entire circumference of the hat) are also recommended. - Call for new or changing lesions. - Start 5-fluorouracil/calcipotriene cream twice a day for 4 days to affected areas including face. Prescription sent to [facility]. Patient advised they will receive an email to purchase the medication online and have it sent to their home. Patient provided with handout reviewing treatment course and side effects and advised to call or message [place] on MyChart with any concerns.  Skin cancer screening performed today.  History of Basal Cell Carcinoma of the Skin - No  evidence of recurrence today - Recommend regular full body skin exams - Recommend daily broad spectrum sunscreen SPF 30+ to sun-exposed areas, reapply every 2 hours as needed.  - Call if any new or changing lesions are noted between office visits  History of Squamous Cell Carcinoma of the Skin - No  evidence of recurrence today - No lymphadenopathy - Recommend regular full body skin exams - Recommend daily broad spectrum sunscreen SPF 30+ to sun-exposed areas, reapply every 2 hours as needed.  - Call if any new or changing lesions are noted between office visits  Return for 1-2 months wart/AK follow up, 6-9 months FBSE.  Graciella Belton, RMA, am acting as scribe for Forest Gleason, MD .  Documentation: I have reviewed the above documentation for accuracy and completeness, and I agree with the above.  Forest Gleason, MD

## 2021-05-10 ENCOUNTER — Encounter: Payer: Self-pay | Admitting: Dermatology

## 2021-07-12 ENCOUNTER — Ambulatory Visit: Payer: Medicare Other | Admitting: Dermatology

## 2021-07-12 ENCOUNTER — Other Ambulatory Visit: Payer: Self-pay

## 2021-07-12 DIAGNOSIS — L219 Seborrheic dermatitis, unspecified: Secondary | ICD-10-CM

## 2021-07-12 DIAGNOSIS — L578 Other skin changes due to chronic exposure to nonionizing radiation: Secondary | ICD-10-CM | POA: Diagnosis not present

## 2021-07-12 DIAGNOSIS — B079 Viral wart, unspecified: Secondary | ICD-10-CM

## 2021-07-12 MED ORDER — KETOCONAZOLE 2 % EX CREA: TOPICAL_CREAM | CUTANEOUS | 1 refills | Status: DC

## 2021-07-12 NOTE — Progress Notes (Signed)
? ?Follow-Up Visit ?  ?Subjective  ?George Schmidt is a 71 y.o. male who presents for the following: Warts (Pubic area - resolved per patient ) and Actinic Keratosis (L antecubital fossa x 1, L forearm x 1 - check for new or persistent lesions. He was able to use the Calcipotriene/5FU cream on the face since the last visit and he did have a reaction. He has noticed a scaly patch on the R med canthus and R ear. ). ? ?The following portions of the chart were reviewed this encounter and updated as appropriate:  ? Tobacco  Allergies  Meds  Problems  Med Hx  Surg Hx  Fam Hx   ?  ? ?Review of Systems:  No other skin or systemic complaints except as noted in HPI or Assessment and Plan. ? ?Objective  ?Well appearing patient in no apparent distress; mood and affect are within normal limits. ? ?A focused examination was performed including the face, ears, and arms. Relevant physical exam findings are noted in the Assessment and Plan. ? ?Right Ear ?Pink patches with greasy scale.  ? ?Pubic x 1 ?Verrucous papules -- Discussed viral etiology and contagion.  ? ? ? 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 ?Chronic and persistent condition with duration or expected duration over one year. Condition is bothersome/symptomatic for patient. Currently flared. ? ?Seborrheic Dermatitis  ?-  is a chronic persistent rash characterized by pinkness and scaling most commonly of the mid face but also can occur on the scalp (dandruff), ears; mid chest, mid back and groin.  It tends to be exacerbated by stress and cooler weather.  People who have neurologic disease may experience new onset or exacerbation of existing seborrheic dermatitis.  The condition is not curable but treatable and can be controlled. ? ?Start Ketoconazole 2% cream to aa's BID.  ? ?ketoconazole (NIZORAL) 2 % cream - Right Ear ?For scale apply to the ears BID. ? ?Viral warts, unspecified type ?Pubic x 1 ? ?Discussed viral etiology and risk of spread.   Discussed multiple treatments may be required to clear warts.  Discussed possible post-treatment dyspigmentation and risk of recurrence. ? ?Prior to procedure, discussed risks of blister formation, small wound, skin dyspigmentation, or rare scar following cryotherapy. Recommend Vaseline ointment to treated areas while healing. ? ? ?Destruction of lesion - Pubic x 1 ?Complexity: simple   ?Destruction method: cryotherapy   ?Informed consent: discussed and consent obtained   ?Timeout:  patient name, date of birth, surgical site, and procedure verified ?Lesion destroyed using liquid nitrogen: Yes   ?Region frozen until ice ball extended beyond lesion: Yes   ?Outcome: patient tolerated procedure well with no complications   ?Post-procedure details: wound care instructions given   ? ? ?Actinic Damage - Severe, confluent actinic changes with pre-cancerous actinic keratoses  ?- Severe, chronic, not at goal, secondary to cumulative UV radiation exposure over time ?- diffuse scaly erythematous macules and papules with underlying dyspigmentation ?- Discussed Prescription "Field Treatment" for Severe, Chronic Confluent Actinic Changes with Pre-Cancerous Actinic Keratoses ?Field treatment involves treatment of an entire area of skin that has confluent Actinic Changes (Sun/ Ultraviolet light damage) and PreCancerous Actinic Keratoses by method of PhotoDynamic Therapy (PDT) and/or prescription Topical Chemotherapy agents such as 5-fluorouracil, 5-fluorouracil/calcipotriene, and/or imiquimod.  The purpose is to decrease the number of clinically evident and subclinical PreCancerous lesions to prevent progression to development of skin cancer by chemically destroying early precancer changes that may or may not be visible.  It has been shown to reduce the risk of developing skin cancer in the treated area. As a result of treatment, redness, scaling, crusting, and open sores may occur during treatment course. One or more than one of  these methods may be used and may have to be used several times to control, suppress and eliminate the PreCancerous changes. Discussed treatment course, expected reaction, and possible side effects. ?- Recommend daily broad spectrum sunscreen SPF 30+ to sun-exposed areas, reapply every 2 hours as needed.  ?- Staying in the shade or wearing long sleeves, sun glasses (UVA+UVB protection) and wide brim hats (4-inch brim around the entire circumference of the hat) are also recommended. ?- Call for new or changing lesions. ?- Start 5FU/Calcipotriene cream BID x 4 days to the entire face and BID x 7 days to the ears. Provided with handout on what to expect.  ?- Plan to treat the arms and hands during fall season. Will discuss at next appointment.   ? ?Return in about 3 months (around 10/12/2021) for AK f/u . ? ?I, Rudell Cobb, CMA, am acting as scribe for Forest Gleason, MD  ? ?Documentation: I have reviewed the above documentation for accuracy and completeness, and I agree with the above. ? ?Forest Gleason, MD ? ?

## 2021-07-12 NOTE — Patient Instructions (Addendum)
-------------------------------------------------------------------------------------------------------------------------------------------  Start 5-fluorouracil/calcipotriene cream twice daily for four days to the entire face and twice daily for seven days to the ears.   5-fluorouracil/calcipotriene cream is is a type of field treatment used to treat precancers, thin skin cancers, and areas of sun damage. Expected reaction includes irritation and mild inflammation potentially progressing to more severe inflammation including redness, scaling, crusting and open sores/erosions.  If too much irritation occurs, ensure application of only a thin layer and decrease frequency of use to achieve a tolerable level of inflammation. Recommend applying Vaseline ointment to open sores as needed.  Minimize sun exposure while under treatment. Recommend daily broad spectrum sunscreen SPF 30+ to sun-exposed areas, reapply every 2 hours as needed.   Instructions for [facility] Medications  One or more of your medications was sent to the [facility] mail order compounding pharmacy. You will receive an email from them and can purchase the medicine through that link. It will then be mailed to your home at the address you confirmed. If for any reason you do not receive an email from them, please check your spam folder. If you still do not find the email, please let us know. [facility] phone number is [phone].  5-Fluorouracil/Calcipotriene Patient Education   Actinic keratoses are the dry, red scaly spots on the skin caused by sun damage. A portion of these spots can turn into skin cancer with time, and treating them can help prevent development of skin cancer.   Treatment of these spots requires removal of the defective skin cells. There are various ways to remove actinic keratoses, including freezing with liquid nitrogen, treatment with creams, or treatment with a blue light procedure in the office.    5-fluorouracil cream is a topical cream used to treat actinic keratoses. It works by interfering with the growth of abnormal fast-growing skin cells, such as actinic keratoses. These cells peel off and are replaced by healthy ones.   5-fluorouracil/calcipotriene is a combination of the 5-fluorouracil cream with a vitamin D analog cream called calcipotriene. The calcipotriene alone does not treat actinic keratoses. However, when it is combined with 5-fluorouracil, it helps the 5-fluorouracil treat the actinic keratoses much faster so that the same results can be achieved with a much shorter treatment time.  INSTRUCTIONS FOR 5-FLUOROURACIL/CALCIPOTRIENE CREAM:   5-fluorouracil/calcipotriene cream typically only needs to be used for 4-7 days. A thin layer should be applied twice a day to the treatment areas recommended by your physician.   If your physician prescribed you separate tubes of 5-fluourouracil and calcipotriene, apply a thin layer of 5-fluorouracil followed by a thin layer of calcipotriene.   Avoid contact with your eyes, nostrils, and mouth. Do not use 5-fluorouracil/calcipotriene cream on infected or open wounds.   You will develop redness, irritation and some crusting at areas where you have pre-cancer damage/actinic keratoses. IF YOU DEVELOP PAIN, BLEEDING, OR SIGNIFICANT CRUSTING, STOP THE TREATMENT EARLY - you have already gotten a good response and the actinic keratoses should clear up well.  Wash your hands after applying 5-fluorouracil 5% cream on your skin.   A moisturizer or sunscreen with a minimum SPF 30 should be applied each morning.   Once you have finished the treatment, you can apply a thin layer of Vaseline twice a day to irritated areas to soothe and calm the areas more quickly. If you experience significant discomfort, contact your physician.  For some patients it is necessary to repeat the treatment for best results.  SIDE EFFECTS: When  using  5-fluorouracil/calcipotriene cream, you may have mild irritation, such as redness, dryness, swelling, or a mild burning sensation. This usually resolves within 2 weeks. The more actinic keratoses you have, the more redness and inflammation you can expect during treatment. Eye irritation has been reported rarely. If this occurs, please let us know.   If you have any trouble using this cream, please call the office. If you have any other questions about this information, please do not hesitate to ask me before you leave the office.   -------------------------------------------------------------------------------------------------------------------------------------------    If You Need Anything After Your Visit  If you have any questions or concerns for your doctor, please call our main line at [phone] and press option 4 to reach your doctor's medical assistant. If no one answers, please leave a voicemail as directed and we will return your call as soon as possible. Messages left after 4 pm will be answered the following business day.   You may also send [place] a message via [place]. We typically respond to MyChart messages within 1-2 business days.  For prescription refills, please ask your pharmacy to contact our office. Our fax number is [phone].  If you have an urgent issue when the clinic is closed that cannot wait until the next business day, you can page your doctor at the number below.    Please note that while we do our best to be available for urgent issues outside of office hours, we are not available 24/7.   If you have an urgent issue and are unable to reach [place], you may choose to seek medical care at your doctor's office, retail clinic, urgent care center, or emergency room.  If you have a medical emergency, please immediately call 911 or go to the emergency department.  Pager Numbers  - Dr. Nehemiah Massed: [phone]  - Dr. Laurence Ferrari: [phone]  - Dr. Nicole Kindred:  [phone]  In the event of inclement weather, please call our main line at [phone] for an update on the status of any delays or closures.  Dermatology Medication Tips: Please keep the boxes that topical medications come in in order to help keep track of the instructions about where and how to use these. Pharmacies typically print the medication instructions only on the boxes and not directly on the medication tubes.   If your medication is too expensive, please contact our office at [phone] option 4 or send [place] a message through [place].   We are unable to tell what your co-pay for medications will be in advance as this is different depending on your insurance coverage. However, we may be able to find a substitute medication at lower cost or fill out paperwork to get insurance to cover a needed medication.   If a prior authorization is required to get your medication covered by your insurance company, please allow [place] 1-2 business days to complete this process.  Drug prices often vary depending on where the prescription is filled and some pharmacies may offer cheaper prices.  The website www.goodrx.com contains coupons for medications through different pharmacies. The prices here do not account for what the cost may be with help from insurance (it may be cheaper with your insurance), but the website can give you the price if you did not use any insurance.  - You can print the associated coupon and take it with your prescription to the pharmacy.  - You may also stop by our office during regular business hours and pick up a GoodRx coupon card.  -  If you need your prescription sent electronically to a different pharmacy, notify our office through [facility] ([place] or by phone at [phone] option 4.     Si Usted Necesita Algo Despus de Su Visita  Tambin puede enviarnos un mensaje a travs de Pharmacist, community. Por lo general respondemos a los mensajes de MyChart en el transcurso de 1 a 2  das hbiles.  Para renovar recetas, por favor pida a su farmacia que se ponga en contacto con nuestra oficina. Harland Dingwall de fax es [place] [phone].  Si tiene un asunto urgente cuando la clnica est cerrada y que no puede esperar hasta el siguiente da hbil, puede llamar/localizar a su doctor(a) al nmero que aparece a continuacin.   Por favor, tenga en cuenta que aunque hacemos todo lo posible para estar disponibles para asuntos urgentes fuera del horario de [place], no estamos disponibles las 24 horas del da, los 7 das de la [place].   Si tiene un problema urgente y no puede comunicarse con nosotros, puede optar por buscar atencin mdica  en el consultorio de su doctor(a), en una clnica privada, en un centro de atencin urgente o en una sala de emergencias.  Si tiene Engineering geologist, por favor llame inmediatamente al 911 o vaya a la sala de emergencias.  Nmeros de bper  - Dr. Nehemiah Massed: [phone]  - Dra. Moye: [phone]  - Dra. Nicole Kindred: [phone]  En caso de inclemencias del [place], por favor llame a Johnsie Kindred principal al [phone] para una actualizacin sobre el [place] de cualquier retraso o cierre.  Consejos para la medicacin en dermatologa: Por favor, guarde las cajas en las que vienen los medicamentos de uso tpico para ayudarle a seguir las instrucciones sobre dnde y cmo usarlos. Las farmacias generalmente imprimen las instrucciones del medicamento slo en las cajas y no directamente en los tubos del [place].   Si su medicamento es muy caro, por favor, pngase en contacto con Zigmund Daniel llamando al [phone] y presione la opcin 4 o envenos un mensaje a travs de Pharmacist, community.   No podemos decirle cul ser su copago por los medicamentos por adelantado ya que esto es diferente dependiendo de la cobertura de su seguro. Sin embargo, es posible que podamos encontrar un medicamento sustituto a Electrical engineer un formulario para que el  seguro cubra el medicamento que se considera necesario.   Si se requiere una autorizacin previa para que su compaa de seguros [place] su medicamento, por favor permtanos de 1 a 2 das hbiles para completar este proceso.  Los precios de los medicamentos varan con frecuencia dependiendo del Environmental consultant de dnde se surte la receta y alguna farmacias pueden ofrecer precios ms baratos.  El sitio web www.goodrx.com tiene cupones para medicamentos de Airline pilot. Los precios aqu no tienen en cuenta lo que podra costar con la ayuda del seguro (puede ser ms barato con su seguro), pero el sitio web puede darle el precio si no utiliz Research scientist (physical sciences).  - Puede imprimir el cupn correspondiente y llevarlo con su receta a la farmacia.  - Tambin puede pasar por nuestra oficina durante el horario de atencin regular y Charity fundraiser una tarjeta de cupones de GoodRx.  - Si necesita que su receta se enve electrnicamente a una farmacia diferente, informe a nuestra oficina a travs de MyChart de [place] o por telfono llamando al [phone] y presione la opcin 4.

## 2021-07-15 ENCOUNTER — Encounter: Payer: Self-pay | Admitting: Dermatology

## 2021-07-18 ENCOUNTER — Emergency Department: Payer: Medicare Other

## 2021-07-18 ENCOUNTER — Other Ambulatory Visit: Payer: Self-pay

## 2021-07-18 ENCOUNTER — Inpatient Hospital Stay
Admission: EM | Admit: 2021-07-18 | Discharge: 2021-07-20 | DRG: 065 | Disposition: A | Discharge status: Home / Self Care | Payer: Medicare Other | Attending: Internal Medicine | Admitting: Internal Medicine

## 2021-07-18 DIAGNOSIS — Z79899 Other long term (current) drug therapy: Secondary | ICD-10-CM

## 2021-07-18 DIAGNOSIS — R29810 Facial weakness: Secondary | ICD-10-CM | POA: Diagnosis present

## 2021-07-18 DIAGNOSIS — I63511 Cerebral infarction due to unspecified occlusion or stenosis of right middle cerebral artery: Principal | ICD-10-CM | POA: Diagnosis present

## 2021-07-18 DIAGNOSIS — N1832 Chronic kidney disease, stage 3b: Secondary | ICD-10-CM | POA: Diagnosis present

## 2021-07-18 DIAGNOSIS — Z28311 Partially vaccinated for covid-19: Secondary | ICD-10-CM

## 2021-07-18 DIAGNOSIS — Z87891 Personal history of nicotine dependence: Secondary | ICD-10-CM

## 2021-07-18 DIAGNOSIS — R4701 Aphasia: Secondary | ICD-10-CM | POA: Diagnosis present

## 2021-07-18 DIAGNOSIS — Z8249 Family history of ischemic heart disease and other diseases of the circulatory system: Secondary | ICD-10-CM

## 2021-07-18 DIAGNOSIS — E1122 Type 2 diabetes mellitus with diabetic chronic kidney disease: Secondary | ICD-10-CM | POA: Diagnosis present

## 2021-07-18 DIAGNOSIS — I10 Essential (primary) hypertension: Secondary | ICD-10-CM | POA: Diagnosis present

## 2021-07-18 DIAGNOSIS — Z85828 Personal history of other malignant neoplasm of skin: Secondary | ICD-10-CM

## 2021-07-18 DIAGNOSIS — Z832 Family history of diseases of the blood and blood-forming organs and certain disorders involving the immune mechanism: Secondary | ICD-10-CM

## 2021-07-18 DIAGNOSIS — E669 Obesity, unspecified: Secondary | ICD-10-CM | POA: Diagnosis present

## 2021-07-18 DIAGNOSIS — E785 Hyperlipidemia, unspecified: Secondary | ICD-10-CM | POA: Diagnosis present

## 2021-07-18 DIAGNOSIS — Z8616 Personal history of COVID-19: Secondary | ICD-10-CM

## 2021-07-18 DIAGNOSIS — Z20822 Contact with and (suspected) exposure to covid-19: Secondary | ICD-10-CM | POA: Diagnosis present

## 2021-07-18 DIAGNOSIS — I639 Cerebral infarction, unspecified: Secondary | ICD-10-CM | POA: Diagnosis not present

## 2021-07-18 DIAGNOSIS — Z7984 Long term (current) use of oral hypoglycemic drugs: Secondary | ICD-10-CM

## 2021-07-18 DIAGNOSIS — Z880 Allergy status to penicillin: Secondary | ICD-10-CM

## 2021-07-18 DIAGNOSIS — Z7902 Long term (current) use of antithrombotics/antiplatelets: Secondary | ICD-10-CM

## 2021-07-18 DIAGNOSIS — G8194 Hemiplegia, unspecified affecting left nondominant side: Secondary | ICD-10-CM | POA: Diagnosis present

## 2021-07-18 DIAGNOSIS — Z6834 Body mass index (BMI) 34.0-34.9, adult: Secondary | ICD-10-CM

## 2021-07-18 DIAGNOSIS — I129 Hypertensive chronic kidney disease with stage 1 through stage 4 chronic kidney disease, or unspecified chronic kidney disease: Secondary | ICD-10-CM | POA: Diagnosis present

## 2021-07-18 DIAGNOSIS — I63311 Cerebral infarction due to thrombosis of right middle cerebral artery: Secondary | ICD-10-CM

## 2021-07-18 DIAGNOSIS — I440 Atrioventricular block, first degree: Secondary | ICD-10-CM | POA: Diagnosis present

## 2021-07-18 DIAGNOSIS — I480 Paroxysmal atrial fibrillation: Secondary | ICD-10-CM | POA: Diagnosis not present

## 2021-07-18 DIAGNOSIS — Z881 Allergy status to other antibiotic agents status: Secondary | ICD-10-CM

## 2021-07-18 DIAGNOSIS — R29703 NIHSS score 3: Secondary | ICD-10-CM | POA: Diagnosis present

## 2021-07-18 HISTORY — DX: Cerebral infarction due to unspecified occlusion or stenosis of right middle cerebral artery: I63.511

## 2021-07-18 LAB — COMPREHENSIVE METABOLIC PANEL
ALT: 14 U/L (ref 0–44)
AST: 29 U/L (ref 15–41)
Albumin: 4.4 g/dL (ref 3.5–5.0)
Alkaline Phosphatase: 33 U/L — ABNORMAL LOW (ref 38–126)
Anion gap: 13 (ref 5–15)
BUN: 30 mg/dL — ABNORMAL HIGH (ref 8–23)
CO2: 21 mmol/L — ABNORMAL LOW (ref 22–32)
Calcium: 9.5 mg/dL (ref 8.9–10.3)
Chloride: 103 mmol/L (ref 98–111)
Creatinine, Ser: 1.67 mg/dL — ABNORMAL HIGH (ref 0.61–1.24)
GFR, Estimated: 43 mL/min — ABNORMAL LOW (ref >60)
Glucose, Bld: 161 mg/dL — ABNORMAL HIGH (ref 70–99)
Potassium: 3.6 mmol/L (ref 3.5–5.1)
Sodium: 137 mmol/L (ref 135–145)
Total Bilirubin: 1 mg/dL (ref 0.3–1.2)
Total Protein: 7.6 g/dL (ref 6.5–8.1)

## 2021-07-18 LAB — DIFFERENTIAL
Abs Immature Granulocytes: 0.04 10*3/uL (ref 0.00–0.07)
Basophils Absolute: 0 10*3/uL (ref 0.0–0.1)
Basophils Relative: 0 %
Eosinophils Absolute: 0 10*3/uL (ref 0.0–0.5)
Eosinophils Relative: 0 %
Immature Granulocytes: 0 %
Lymphocytes Relative: 13 %
Lymphs Abs: 1.2 10*3/uL (ref 0.7–4.0)
Monocytes Absolute: 0.9 10*3/uL (ref 0.1–1.0)
Monocytes Relative: 9 %
Neutro Abs: 7.5 10*3/uL (ref 1.7–7.7)
Neutrophils Relative %: 78 %

## 2021-07-18 LAB — URINE DRUG SCREEN, QUALITATIVE (ARMC ONLY)
Amphetamines, Ur Screen: NOT DETECTED
Barbiturates, Ur Screen: NOT DETECTED
Benzodiazepine, Ur Scrn: NOT DETECTED
Cannabinoid 50 Ng, Ur ~~LOC~~: NOT DETECTED
Cocaine Metabolite,Ur ~~LOC~~: NOT DETECTED
MDMA (Ecstasy)Ur Screen: POSITIVE — AB
Methadone Scn, Ur: NOT DETECTED
Opiate, Ur Screen: NOT DETECTED
Phencyclidine (PCP) Ur S: NOT DETECTED
Tricyclic, Ur Screen: NOT DETECTED

## 2021-07-18 LAB — CBC
HCT: 40.8 % (ref 39.0–52.0)
Hemoglobin: 14.2 g/dL (ref 13.0–17.0)
MCH: 31.2 pg (ref 26.0–34.0)
MCHC: 34.8 g/dL (ref 30.0–36.0)
MCV: 89.7 fL (ref 80.0–100.0)
Platelets: 273 10*3/uL (ref 150–400)
RBC: 4.55 MIL/uL (ref 4.22–5.81)
RDW: 12.9 % (ref 11.5–15.5)
WBC: 9.7 10*3/uL (ref 4.0–10.5)
nRBC: 0 % (ref 0.0–0.2)

## 2021-07-18 LAB — RESP PANEL BY RT-PCR (FLU A&B, COVID) ARPGX2
Influenza A by PCR: NEGATIVE
Influenza B by PCR: NEGATIVE
SARS Coronavirus 2 by RT PCR: NEGATIVE

## 2021-07-18 LAB — APTT: aPTT: 31 seconds (ref 24–36)

## 2021-07-18 LAB — PROTIME-INR
INR: 1.1 (ref 0.8–1.2)
Prothrombin Time: 14.2 seconds (ref 11.4–15.2)

## 2021-07-18 IMAGING — MR MR HEAD W/O CM
12 series · 48 of 48 positions shown · non-contrast
Comparison: CT head [DATE]
COMPARISON: CT head [DATE]

Addendum:
CLINICAL DATA: Acute neuro deficit.  Stroke.  Left-sided weakness.

EXAM:
MRI HEAD WITHOUT CONTRAST
TECHNIQUE: Multiplanar, multiecho pulse sequences of the brain and surrounding
structures were obtained without intravenous contrast.

[Series 5: ax dwi_tracew · axial · 3.0mm · 0.71mm/px · z∈[-87,+75]mm · 7 of 112 slices shown]
[im 1/112]
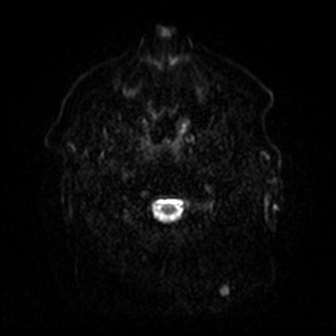
[im 19/112]
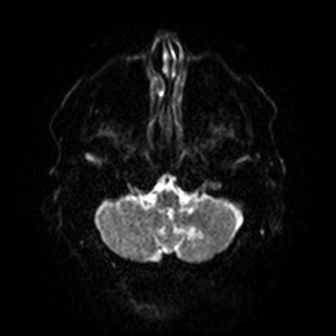
[im 38/112]
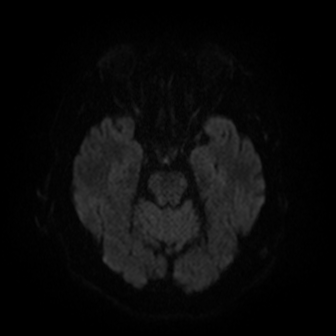
[im 56/112]
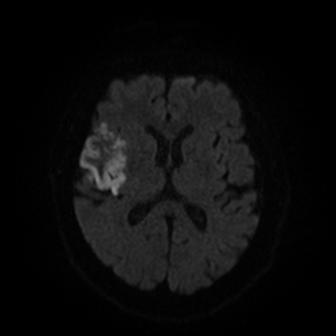
[im 75/112]
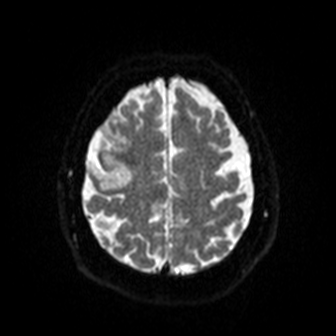
[im 93/112]
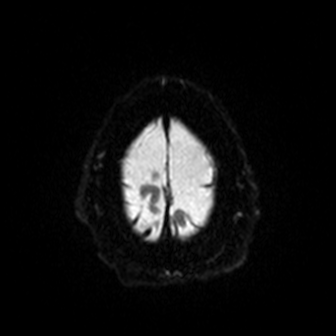
[im 112/112]
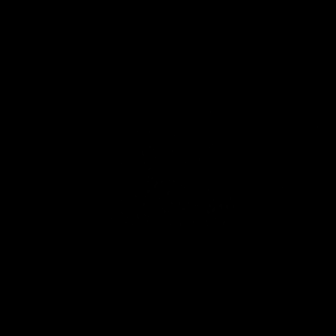

[Series 6: ax dwi_adc · axial · 3.0mm · 0.71mm/px · z∈[-87,+72]mm · 4 of 55 slices shown]
[im 1/55]
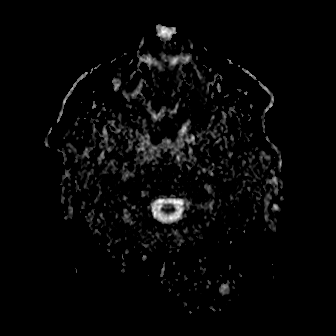
[im 19/55]
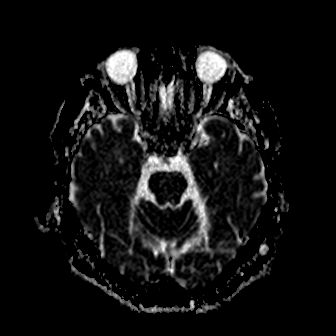
[im 37/55]
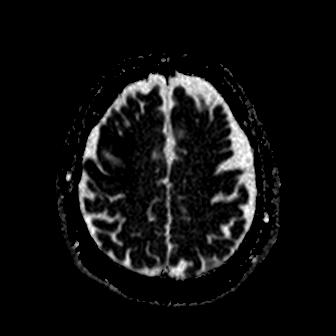
[im 55/55]
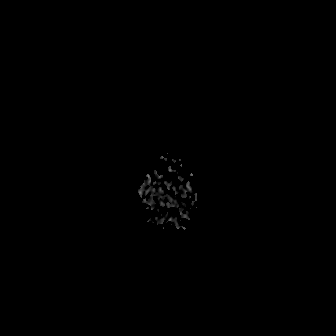

[Series 7: cor dwi_tracew · coronal · 5.0mm · 0.68mm/px · 3 of 40 slices shown]
[im 1/40]
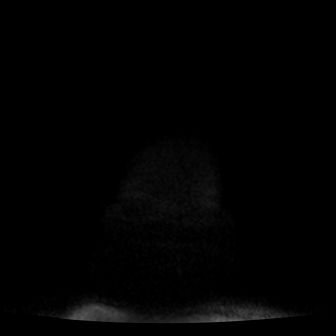
[im 20/40]
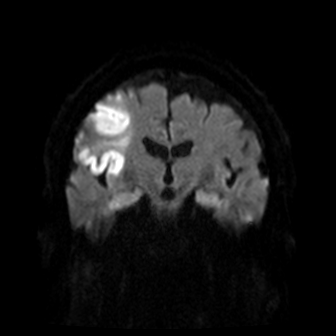
[im 40/40]
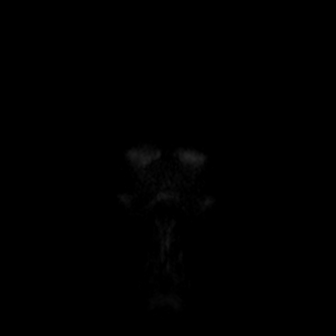

[Series 8: cor dwi_adc · coronal · 5.0mm · 0.68mm/px · 3 of 40 slices shown]
[im 1/40]
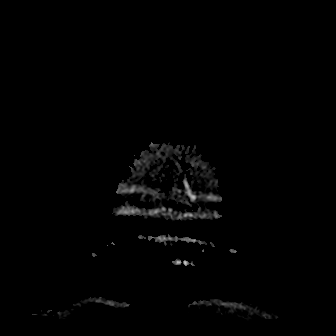
[im 20/40]
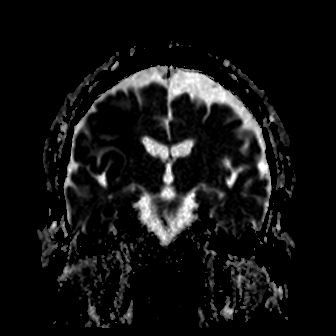
[im 40/40]
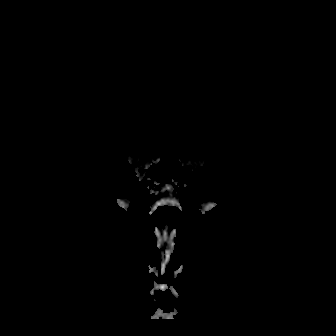

[Series 9: T1 · sagittal · 5.0mm · 0.47mm/px · 2 of 24 slices shown (1 of 2)]
[im 1/24]
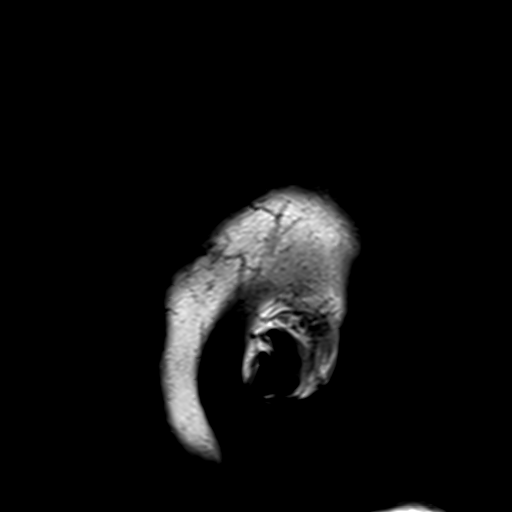
[im 24/24]
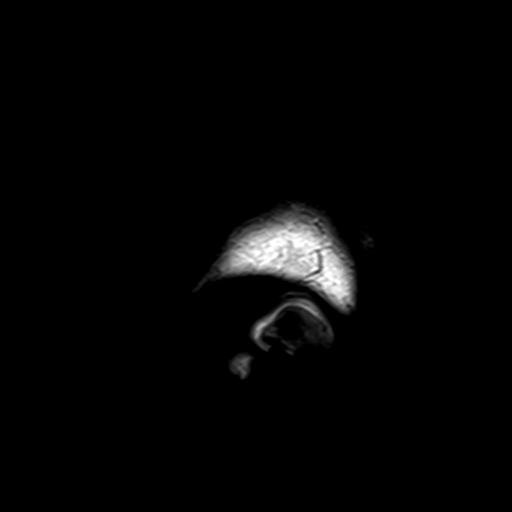

[Series 10: T2 · axial · 5.0mm · 0.86mm/px · z∈[-84,+69]mm · 2 of 27 slices shown (1 of 2)]
[im 1/27]
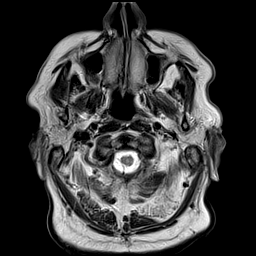
[im 27/27]
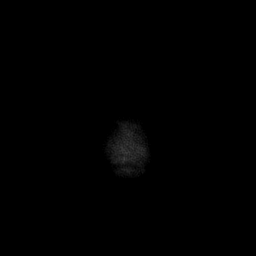

[Series 11: mag_images · axial · 3.0mm · 0.90mm/px · z∈[-81,+69]mm · 3 of 52 slices shown]
[im 1/52]
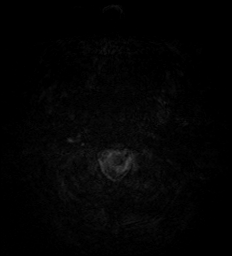
[im 26/52]
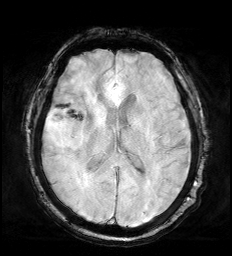
[im 52/52]
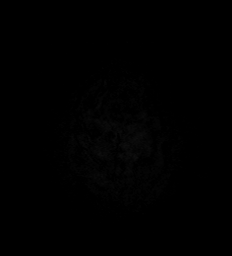

[Series 12: pha_images · axial · 3.0mm · 0.90mm/px · z∈[-81,+69]mm · 3 of 51 slices shown]
[im 1/51]
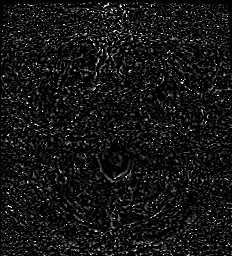
[im 26/51]
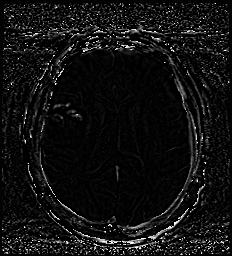
[im 51/51]
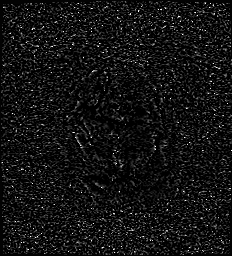

[Series 13: swi_images · axial · 3.0mm · 0.90mm/px · z∈[-81,+69]mm · 3 of 52 slices shown]
[im 1/52]
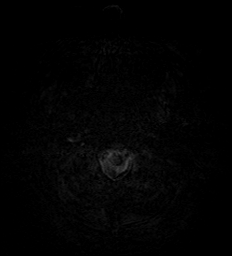
[im 26/52]
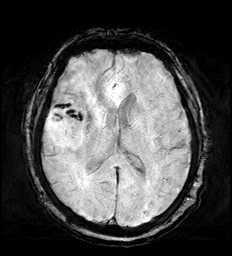
[im 52/52]
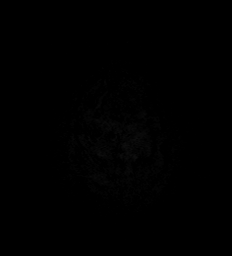

[Series 15: FLAIR · axial · 3.0mm · 0.69mm/px · z∈[-87,+72]mm · 4 of 55 slices shown]
[im 1/55]
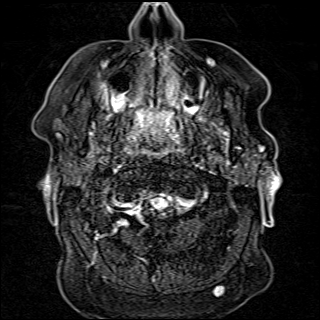
[im 19/55]
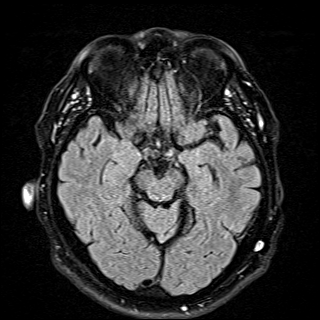
[im 37/55]
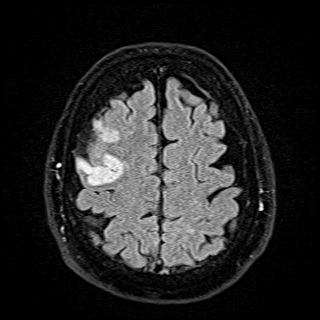
[im 55/55]
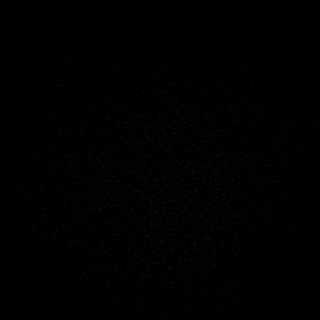

[Series 16: T1 · axial · 1.0mm · 0.98mm/px · z∈[-94,+78]mm · 12 of 176 slices shown (2 of 2)]
[im 1/176]
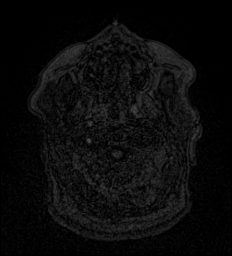
[im 16/176]
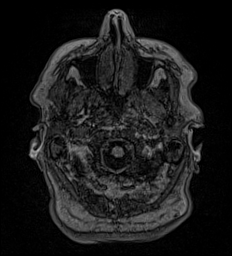
[im 32/176]
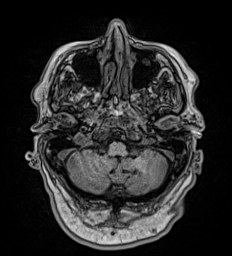
[im 48/176]
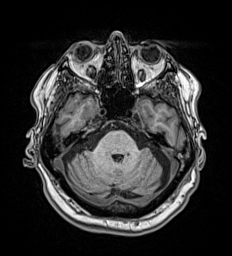
[im 64/176]
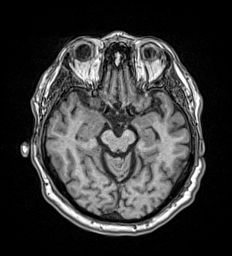
[im 80/176]
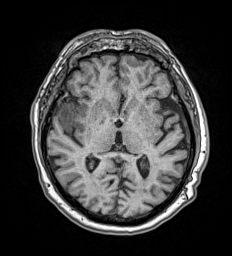
[im 96/176]
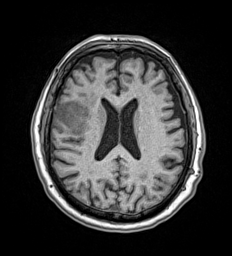
[im 112/176]
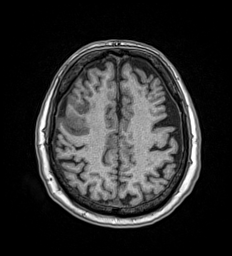
[im 128/176]
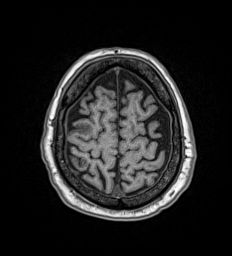
[im 144/176]
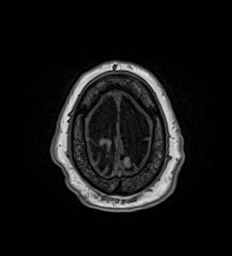
[im 160/176]
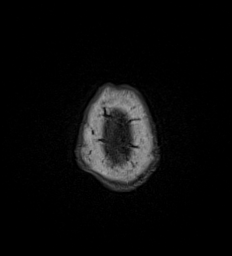
[im 176/176]
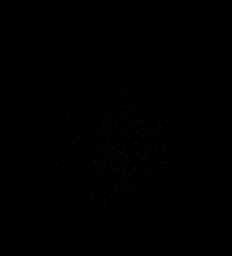

[Series 17: T2 · coronal · 5.0mm · 0.86mm/px · 2 of 30 slices shown (2 of 2)]
[im 1/30]
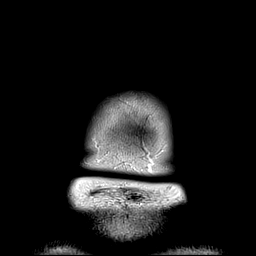
[im 30/30]
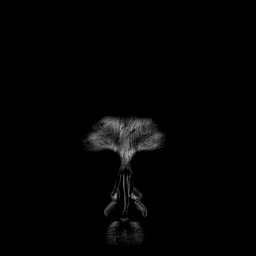

[48 of 48 positions shown; findings below may reference images not displayed]

FINDINGS: Brain: Acute infarct right MCA territory. Infarct involves the
operculum and right frontal lobe. Mild associated petechial
hemorrhage in the infarct. Sparing of the basal ganglia and
posterior division of the right MCA.

Ventricle size normal.  Slight midline shift to the left.

Chronic infarct left inferior cerebellum. Mild chronic microvascular
ischemic change in the white matter. No mass lesion identified.

Vascular: Normal arterial flow voids.

Skull and upper cervical spine: Negative

Sinuses/Orbits: Paranasal sinuses clear.  Negative orbit

Other: None
IMPRESSION: Acute infarct right MCA territory with mild petechial hemorrhage.
Mild mass-effect and midline shift to the left.

Mild chronic microvascular ischemic change in the white matter.
Small chronic infarct left PICA territory.

ADDENDUM:
These results were called by telephone at the time of interpretation
on [DATE] at [DATE] to provider NYA , who verbally
acknowledged these results.

*** End of Addendum ***
FINDINGS: Brain: Acute infarct right MCA territory. Infarct involves the
operculum and right frontal lobe. Mild associated petechial
hemorrhage in the infarct. Sparing of the basal ganglia and
posterior division of the right MCA.

Ventricle size normal.  Slight midline shift to the left.

Chronic infarct left inferior cerebellum. Mild chronic microvascular
ischemic change in the white matter. No mass lesion identified.

Vascular: Normal arterial flow voids.

Skull and upper cervical spine: Negative

Sinuses/Orbits: Paranasal sinuses clear.  Negative orbit

Other: None
IMPRESSION: Acute infarct right MCA territory with mild petechial hemorrhage.
Mild mass-effect and midline shift to the left.

Mild chronic microvascular ischemic change in the white matter.
Small chronic infarct left PICA territory.

## 2021-07-18 IMAGING — CT CT ANGIO HEAD-NECK (W OR W/O PERF)
3 of 9 series · 8 of 33 positions shown · IV contrast (APPLIED)
Comparison: CT head and MRI head [DATE]. CT angio head neck
[DATE]

CLINICAL DATA: Stroke.  Left-sided weakness.  Speech abnormality

EXAM:
CT ANGIOGRAPHY HEAD AND NECK
TECHNIQUE: Multidetector CT imaging of the head and neck was performed using
the standard protocol during bolus administration of intravenous
contrast. Multiplanar CT image reconstructions and MIPs were
obtained to evaluate the vascular anatomy. Carotid stenosis
measurements (when applicable) are obtained utilizing NASCET
criteria, using the distal internal carotid diameter as the
denominator.

[Series 5: cta neck/head · axial · 0.52mm/px · z∈[-250,-126]mm · 2 of 186 slices shown]
[im 62/186  soft-tissue]
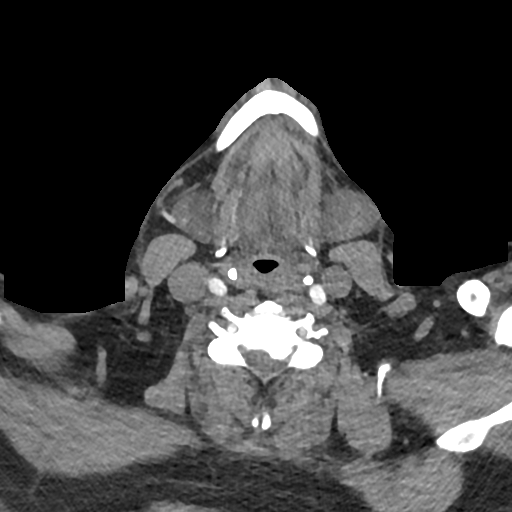
[im 124/186  soft-tissue]
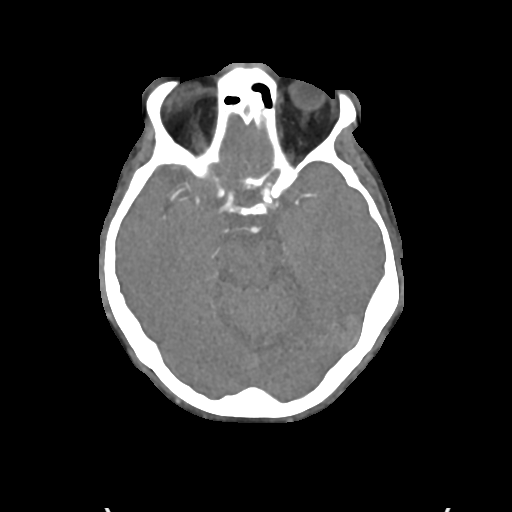

[Series 7: ax thins · axial · 0.43mm/px · z∈[-283,-107]mm · 3 of 178 slices shown]
[im 45/178  soft-tissue]
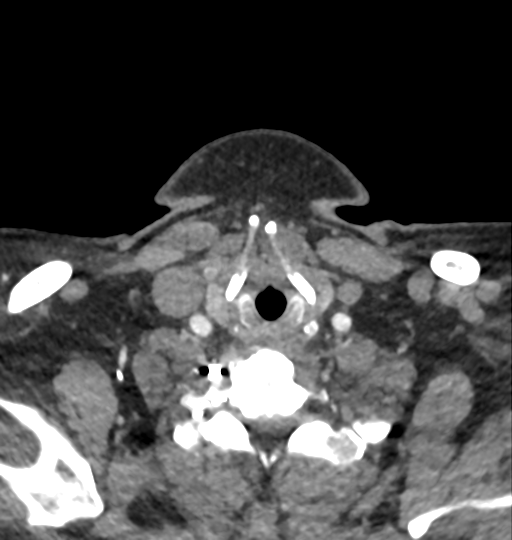
[im 89/178  bone]
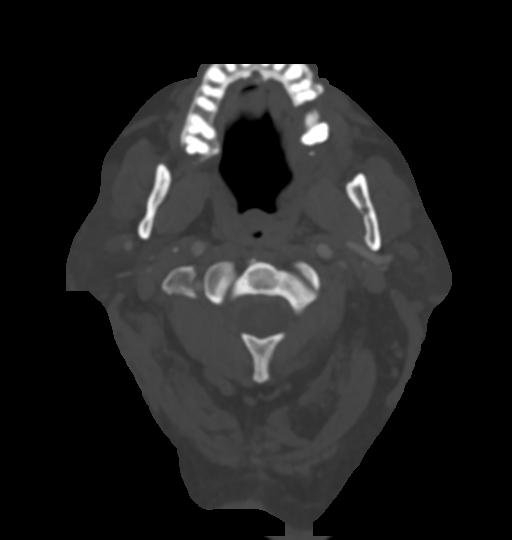
[im 133/178  soft-tissue]
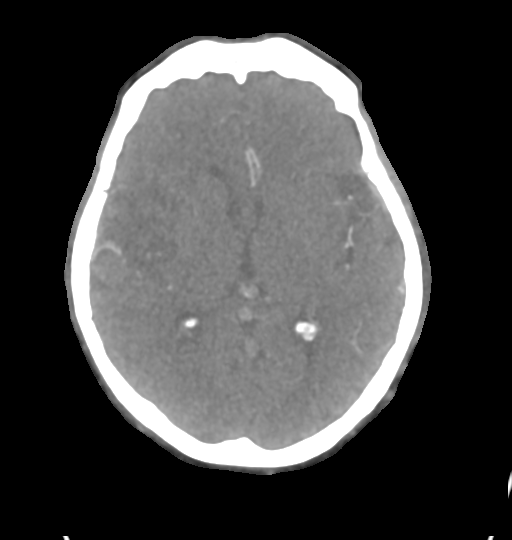

[Series 10: ax thick · axial · 0.43mm/px · z∈[-283,-107]mm · 3 of 178 slices shown]
[im 45/178  soft-tissue]
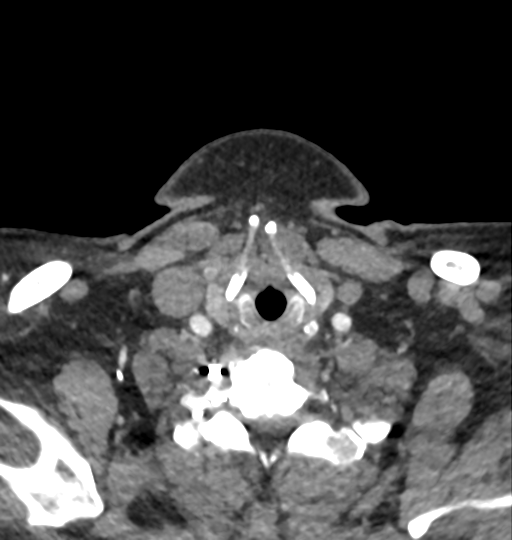
[im 89/178  soft-tissue]
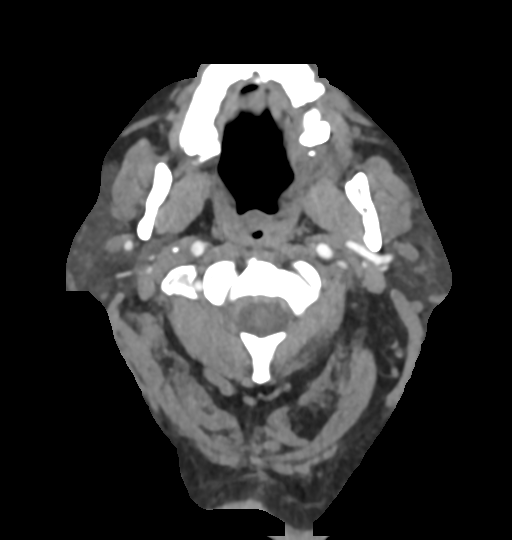
[im 133/178  soft-tissue]
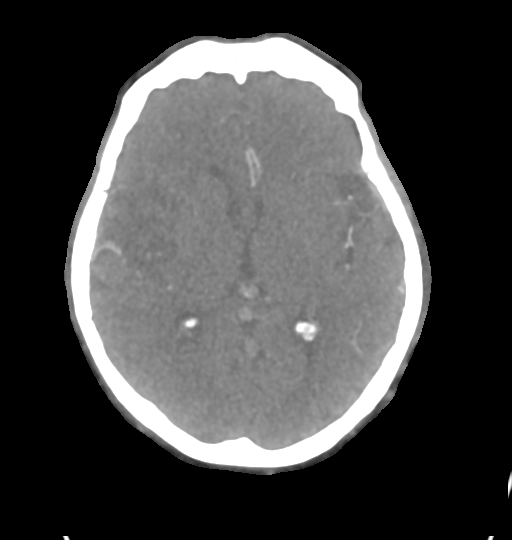

[8 of 33 positions shown; findings below may reference images not displayed]

RADIATION DOSE REDUCTION: This exam was performed according to the
departmental dose-optimization program which includes automated
exposure control, adjustment of the mA and/or kV according to
patient size and/or use of iterative reconstruction technique.

CONTRAST:  50mL OMNIPAQUE IOHEXOL 350 MG/ML SOLN
FINDINGS: CTA NECK FINDINGS

Aortic arch: Standard branching. Imaged portion shows no evidence of
aneurysm or dissection. No significant stenosis of the major arch
vessel origins. Mild atherosclerotic calcification aortic arch and
proximal great vessels

Right carotid system: Mild atherosclerotic disease right carotid
bifurcation without significant stenosis. No plaque ulceration or
thrombus.

Left carotid system: Mild atherosclerotic disease left carotid
bifurcation. Negative for stenosis. No plaque ulceration or
thrombus.

Vertebral arteries: Right vertebral artery is patent to the basilar
without significant stenosis

Left vertebral artery has decreased enhancement proximally and then
occludes at C2. Left vertebral artery remains occluded and does not
contribute to the basilar. Left vertebral artery occlusion is
chronic and unchanged from CT [REDACTED]

Skeleton: Cervical spondylosis.  No acute skeletal abnormality.

Other neck: Negative for mass or adenopathy in the neck.

Upper chest: Lung apices clear bilaterally.

Review of the MIP images confirms the above findings

CTA HEAD FINDINGS

Anterior circulation: Atherosclerotic calcification in the cavernous
carotid bilaterally with mild stenosis bilaterally moderate stenosis
left supraclinoid internal carotid artery.

Right M1 widely patent. Right middle cerebral artery bifurcation
patent. There is decreased opacification of the superior division of
the right MCA with probable small branch occlusions. This correlates
with the right MCA infarct on MRI and CT. Inferior division right
MCA widely patent.

Anterior cerebral arteries patent bilaterally without stenosis. Left
MCA patent without stenosis.

Posterior circulation: Right vertebral artery supplies the basilar.
Mild stenosis distally. Right PICA patent. Distal left vertebral
artery is occluded. Left PICA not visualized.

Basilar widely patent. Superior cerebellar and posterior cerebral
arteries patent bilaterally.

Venous sinuses: Limited venous enhancement due to arterial phase
scanning.

Anatomic variants: None

Review of the MIP images confirms the above findings
IMPRESSION: 1. Poor perfusion of the superior branch of the right MCA likely due
to distal emboli. This corresponds to the area of acute infarct on
CT and MRI.
2. Mild atherosclerotic disease carotid bifurcation bilaterally
without significant stenosis. Mild stenosis cavernous carotid
bilaterally. Moderate supraclinoid internal carotid artery stenosis
on the left
3. Chronic occlusion left vertebral artery at the C2 level which
remains occluded to the basilar. Mild stenosis distal right
vertebral artery.
4. These results were called by telephone at the time of
interpretation on [DATE] at [DATE] to provider JIM
, who verbally acknowledged these results.

## 2021-07-18 MED ORDER — SODIUM CHLORIDE 0.9% FLUSH: 3.0000 mL | Freq: Once | INTRAVENOUS | Status: DC

## 2021-07-18 MED ORDER — CLOPIDOGREL BISULFATE 75 MG PO TABS
75.0000 mg | ORAL_TABLET | Freq: Every day | ORAL | Status: DC
  Administered 2021-07-19: 17:00:00 75 mg via ORAL
  Filled 2021-07-18: qty 1

## 2021-07-18 MED ORDER — ACETAMINOPHEN 160 MG/5ML PO SOLN
650.0000 mg | ORAL | Status: DC | PRN
  Filled 2021-07-18: qty 20.3

## 2021-07-18 MED ORDER — HYDRALAZINE HCL 20 MG/ML IJ SOLN: 5.0000 mg | INTRAMUSCULAR | Status: DC | PRN

## 2021-07-18 MED ORDER — METFORMIN HCL ER 500 MG PO TB24
500.0000 mg | ORAL_TABLET | Freq: Two times a day (BID) | ORAL | Status: DC
  Filled 2021-07-18 (×2): qty 1

## 2021-07-18 MED ORDER — ACETAMINOPHEN 325 MG PO TABS: 650.0000 mg | ORAL_TABLET | ORAL | Status: DC | PRN

## 2021-07-18 MED ORDER — STROKE: EARLY STAGES OF RECOVERY BOOK: Freq: Once | Status: DC

## 2021-07-18 MED ORDER — FENOFIBRATE 160 MG PO TABS
160.0000 mg | ORAL_TABLET | Freq: Every day | ORAL | Status: DC
  Administered 2021-07-19: 17:00:00 160 mg via ORAL
  Filled 2021-07-18 (×2): qty 1

## 2021-07-18 MED ORDER — IOHEXOL 350 MG/ML SOLN
50.0000 mL | Freq: Once | INTRAVENOUS | Status: AC | PRN
  Administered 2021-07-18: 50 mL via INTRAVENOUS
  Filled 2021-07-18: qty 50

## 2021-07-18 MED ORDER — ACETAMINOPHEN 650 MG RE SUPP
650.0000 mg | RECTAL | Status: DC | PRN
Start: 2021-07-18 — End: 2021-07-20

## 2021-07-18 MED ORDER — AMLODIPINE BESYLATE 10 MG PO TABS
10.0000 mg | ORAL_TABLET | Freq: Every day | ORAL | Status: DC
  Administered 2021-07-19: 17:00:00 10 mg via ORAL
  Filled 2021-07-18: qty 1

## 2021-07-18 MED ORDER — ATORVASTATIN CALCIUM 20 MG PO TABS
40.0000 mg | ORAL_TABLET | Freq: Every day | ORAL | Status: DC
  Administered 2021-07-19 – 2021-07-20 (×2): 40 mg via ORAL
  Filled 2021-07-18 (×2): qty 2

## 2021-07-18 NOTE — Assessment & Plan Note (Signed)
-   Resumed atorvastatin 40 mg daily ?

## 2021-07-18 NOTE — H&P (Addendum)
History and Physical   George Schmidt FTD:[id number] DOB: July 19, 1950 DOA: 07/18/2021  PCP: George Mina, MD  Outpatient Specialists: Dr. Rosita Kea Patient coming from: home  I have personally briefly reviewed patient's old medical records in [facility] EMR.  Chief Concern: Left-sided facial droop with left-sided weakness.  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  Initial vitals in the emergency department showed temperature of 97.7, respiration rate of 18, heart rate 52, blood pressure 158/86, SPO2 of 99% on room air.  Serum sodium 137, potassium 3.6, chloride 103, bicarb 21, BUN of 30, serum creatinine of 1.67, nonfasting blood glucose 161, GFR 43.  WBC 9.7, hemoglobin 14.2, platelets of 273.  COVID/influenza A/influenza B PCR were negative.  CT the head without contrast in the ED was read as acute or subacute right anterior MCA territory infarct.  Mild mass effect with 3 mm leftward midline shift.  Mild areas of relative hyperdensity within the infarct could represent petechial hemorrhage or spared brain parenchyma.  Remote inferior left cerebellar infarct.  MRI of the brain without contrast: Acute infarct right MCA territory with mild petechial hemorrhage.  Mild mass effect and midline shift to the left.  Mild chronic microvascular ischemic changes in the white matter.  Small chronic infarct left PICA territory.  CTA of the head neck with and without contrast: Poor perfusion of the superior branch of the right MCA likely due to distal emboli.  This corresponds to the area of acute infarct on CT and MRI.  Mild atherosclerotic disease carotid bifurcation bilaterally without significant  stenosis.  Mild stenosis cavernous carotid bilaterally.  Moderate supraclinoid internal carotid artery stenosis on the left.  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  Mild stenosis distal right vertebral artery.  At bedside, he is able to tell me his name, age, current calendar year, and location of ER hospital.  He states that he had facial droop that started on Monday. [name], he developed slurring of speech. His wife and daughter made him come into hospital. He denies difficulty walking. He states his wife noticed, he did not eat like normal. He states in the ED, he was drinking lemonade, and he had difficulty, and was gagging.  He states he did not come to hospital on Monday because he states, 'I'm stubborn'.   He lost about 15 in one year. He has been drinking more water instead of soda.   Social history: He lives at home with his wife, mother in Social worker, and granddaughter. He denies history of tobacco. His last etoh drink was 5-6 months ago. He denies recreational drug use. He is retired and formerly worked in [employer].   Vaccination history: He is vaccinated for covid, two doses. He is not vaccinated for influenza  ROS: Constitutional: + weight change, no fever ENT/Mouth: no sore throat, no rhinorrhea Eyes: no eye pain, no vision changes Cardiovascular: no chest pain, no dyspnea,  no edema, no palpitations Respiratory: no cough, no sputum, no wheezing Gastrointestinal: no nausea, no vomiting, no diarrhea, no constipation Genitourinary: no urinary incontinence, no dysuria, no hematuria Musculoskeletal: no arthralgias, no myalgias Skin: no skin lesions, no pruritus, Neuro: + weakness, no loss of consciousness, no syncope, left sided mouth droop  Psych: no anxiety, no depression, + decrease appetite Heme/Lymph: no bruising, no bleeding  ED Course: Discussed with EDP, patient requiring hospitalization for chief concerns of acute  right MCA stroke.  Assessment/Plan  Principal Problem:   Acute right MCA stroke (HCC) Active Problems:   Hypertension   Hyperlipidemia   Stage 3b chronic kidney disease (CKD) (HCC)   History of COVID-19   * Acute right MCA stroke (HCC) Assessment & Plan - With mild petechial hemorrhage - Neurosurgeon has been consulted and recommends no decompression procedures at this time - Neurology, Dr. Otelia Limes has been consulted and we appreciate further recommendations - Complete echo with bubble study ordered - Frequent neurochecks - PT, OT, speech consulted - Fall and aspiration precautions - N.p.o. pending passing swallow study - Avoid NSAIDs, aspirin, Plavix, DVT prophylaxis at this time due to petechial hemorrhage - Resumed atorvastatin 40 mg nightly - Admit to telemetry cardiac, observation  Hypertension Assessment & Plan - Amlodipine 10 mg daily, atenolol-chlorthalidone 50-25, 1 tablet, mg daily - Hydralazine 5 mg IV every 4 hours as needed for SBP greater than 160, 2 days ordered  Stage 3b chronic kidney disease (CKD) (HCC) Assessment & Plan - History of CKD 3A - Serum creatinine is 1.67/GFR 43, this appears to be progression to CKD 3B - BMP in the a.m.  History of COVID-19 Assessment & Plan -He had covid in late December of 2022  Hyperlipidemia Assessment & Plan - Resumed atorvastatin 40 mg daily  Chart reviewed.   DVT prophylaxis: scd Code Status: full code Diet: npo, failed bedside swallow study Family Communication: no Disposition Plan: pending clinical course Consults called: Neurology Admission status: telemetry cardiac, observation   Past Medical History:  Diagnosis Date   Arthritis    Basal cell carcinoma 08/25/2019   Left ant. shoulder latera. Superficial and nodular.    Basal cell carcinoma 05/25/2020   L upper back lateral - ED&C   Basal cell carcinoma 05/25/2020   L shoulder posterior adjacent to white patch - ED&C   Basal cell carcinoma  05/25/2020   L upper medial back - ED&C   Diabetes mellitus without complication (HCC)    Hemorrhoids    Hyperlipidemia    Hypertension    Squamous cell carcinoma of skin 04/27/2020   R temple - MOHS 06/16/20 at [facility]   Past Surgical History:  Procedure Laterality Date   COLONOSCOPY WITH PROPOFOL N/A 10/21/2014   Procedure: COLONOSCOPY WITH PROPOFOL;  Surgeon: Elnita Maxwell, MD;  Location: [facility] ENDOSCOPY;  Service: Endoscopy;  Laterality: N/A;   Social History:  reports that he has quit smoking. He has never used smokeless tobacco. He reports current alcohol use of about 1.0 standard drink per week. He reports that he does not use drugs.  Allergies  Allergen Reactions   Amoxicillin Hives   Clarithromycin Hives   Family History  Problem Relation Age of Onset   Heart attack Mother    Clotting disorder Father    Family history: Family history reviewed and not pertinent.  Prior to Admission medications   Medication Sig Start Date End Date Taking? Authorizing Provider  amLODipine (NORVASC) 10 MG tablet Take 10 mg by mouth daily.    [provider]  atenolol-chlorthalidone (TENORETIC) 50-25 MG per tablet Take 1 tablet by mouth daily.    [provider]  atorvastatin (LIPITOR) 40 MG tablet Take 1 tablet (40 mg total) by mouth daily. 12/01/19 11/30/20  Arnetha Courser, MD  clopidogrel (PLAVIX) 75 MG tablet  Take 1 tablet (75 mg total) by mouth daily. 12/01/19   Arnetha Courser, MD  fenofibrate 160 MG tablet Take 160 mg by mouth daily.    [provider]  ibuprofen (ADVIL) 200 MG tablet Take 200 mg by mouth every 6 (six) hours as needed.    [provider]  ketoconazole (NIZORAL) 2 % cream For scale apply to the ears BID. 07/12/21   Moy[place], [place], MD  metFORMIN (GLUCOPHAGE-XR) 500 MG 24 hr tablet Take 2 tablets by mouth once daily 11/29/19   [provider]  mupirocin ointment (BACTROBAN) 2 % Apply 1 application topically  daily. Patient not taking: Reported on 07/12/2021 10/26/20   Neale Burly, [place], MD  potassium chloride SA (K-DUR,KLOR-CON) 20 MEQ tablet Take 20 mEq by mouth 2 (two) times daily.    [provider]  sildenafil (VIAGRA) 100 MG tablet Take by mouth.  12/22/18   [provider]   Physical Exam: Vitals:   07/18/21 1427 07/18/21 2130 07/18/21 2200 07/18/21 2300  BP: (!) 158/86 135/71 (!) 145/66 133/62  Pulse:  (!) 52 (!) 49 (!) 55  Resp:  15 13 17   Temp:      TempSrc:      SpO2:  99% 98% 99%  Weight:      Height:       Constitutional: appears age appropriate, NAD, calm, comfortable Eyes: PERRL, lids and conjunctivae normal ENMT: Mucous membranes are moist. Posterior pharynx clear of any exudate or lesions. Age-appropriate dentition. Hearing appropriate. Left sided mouth droop compared to right Neck: normal, supple, no masses, no thyromegaly Respiratory: clear to auscultation bilaterally, no wheezing, no crackles. Normal respiratory effort. No accessory muscle use.  Cardiovascular: Regular rate and rhythm, no murmurs / rubs / gallops. No extremity edema. 2+ pedal pulses. No carotid bruits.  Abdomen: obese abdomen, no tenderness, no masses palpated, no hepatosplenomegaly. Bowel sounds positive.  Musculoskeletal: no clubbing / cyanosis. No joint deformity upper and lower extremities. Good ROM, no contractures, no atrophy. Normal muscle tone.  Skin: no rashes, lesions, ulcers. No induration Neurologic: Sensation intact. Strength 5/5 in all 4.  Psychiatric: Normal judgment and insight. Alert and oriented x 3. Normal mood.   EKG: independently reviewed, showing sinus bradycardia with rate of 41, QTc 405.  First-degree AV block.  First-degree AV block was present on prior EKG, specifically 11/30/2019  MRI on Admission: I personally reviewed and I agree with radiologist reading as below.  CT ANGIO HEAD NECK W WO CM  Result Date: 07/18/2021 CLINICAL DATA:  Stroke.  Left-sided weakness.   Speech abnormality EXAM: CT ANGIOGRAPHY HEAD AND NECK TECHNIQUE: Multidetector CT imaging of the head and neck was performed using the standard protocol during bolus administration of intravenous contrast. Multiplanar CT image reconstructions and MIPs were obtained to evaluate the vascular anatomy. Carotid stenosis measurements (when applicable) are obtained utilizing NASCET criteria, using the distal internal carotid diameter as the denominator. RADIATION DOSE REDUCTION: This exam was performed according to the departmental dose-optimization program which includes automated exposure control, adjustment of the mA and/or kV according to patient size and/or use of iterative reconstruction technique. CONTRAST:  50mL OMNIPAQUE IOHEXOL 350 MG/ML SOLN COMPARISON:  CT head and MRI head 07/18/2021. CT angio head neck 11/30/2019 FINDINGS: CTA NECK FINDINGS Aortic arch: Standard branching. Imaged portion shows no evidence of aneurysm or dissection. No significant stenosis of the major arch vessel origins. Mild atherosclerotic calcification aortic arch and proximal great vessels Right carotid system: Mild atherosclerotic disease right carotid bifurcation without  significant stenosis. No plaque ulceration or thrombus. Left carotid system: Mild atherosclerotic disease left carotid bifurcation. Negative for stenosis. No plaque ulceration or thrombus. Vertebral arteries: Right vertebral artery is patent to the basilar without significant stenosis Left vertebral artery has decreased enhancement proximally and then occludes at C2. Left vertebral artery remains occluded and does not contribute to the basilar. 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  No acute skeletal abnormality. Other neck: Negative for mass or adenopathy in the neck. Upper chest: Lung apices clear bilaterally. Review of the MIP images confirms the above findings CTA HEAD FINDINGS Anterior  circulation: Atherosclerotic calcification in the cavernous carotid bilaterally with mild stenosis bilaterally moderate stenosis left supraclinoid internal carotid artery. Right M1 widely patent. Right middle cerebral artery bifurcation patent. There is decreased opacification of the superior division of the right MCA with probable small branch occlusions. This correlates with the right MCA infarct on MRI and CT. Inferior division right MCA widely patent. Anterior cerebral arteries patent bilaterally without stenosis. Left MCA patent without stenosis. Posterior circulation: Right vertebral artery supplies the basilar. Mild stenosis distally. Right PICA patent. Distal left vertebral artery is occluded. Left PICA not visualized. Basilar widely patent. Superior cerebellar and posterior cerebral arteries patent bilaterally. Venous sinuses: Limited venous enhancement due to arterial phase scanning. Anatomic variants: None Review of the MIP images confirms the above findings IMPRESSION: 1. Poor perfusion of the superior branch of the right MCA likely due to distal emboli. This corresponds to the area of acute infarct on CT and MRI. 2. Mild atherosclerotic disease carotid bifurcation bilaterally without significant stenosis. Mild stenosis cavernous carotid bilaterally. Moderate supraclinoid internal carotid artery stenosis on the left 3. 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 Mild stenosis distal right vertebral artery. 4. These results were called by telephone at the time of interpretation on 07/18/2021 at 5:42 pm to provider [facility] , who verbally acknowledged these results. Electronically Signed   By: Marlan Palau M.D.   On: 07/18/2021 17:44   CT HEAD WO CONTRAST  Result Date: 07/18/2021 CLINICAL DATA:  Stroke, follow up EXAM: CT HEAD WITHOUT CONTRAST TECHNIQUE: Contiguous axial images were obtained from the base of the skull through the vertex  without intravenous contrast. RADIATION DOSE REDUCTION: This exam was performed according to the departmental dose-optimization program which includes automated exposure control, adjustment of the mA and/or kV according to patient size and/or use of iterative reconstruction technique. COMPARISON:  11/30/2019. FINDINGS: Brain: Hypoattenuation loss of gray-white differentiation in the right insula and overlying frontal lobe, compatible with acute or subacute right anterior MCA distribution infarct. Mild areas of relative hyperdensity within the infarct could represent petechial hemorrhage or spared brain parenchyma. Mild mass effect with 3 mm of leftward midline shift at the foramen of [place]. Remote inferior left cerebellar infarct. Vascular: No hyperdense vessel identified. Calcific intracranial atherosclerosis Skull: No acute fracture. Sinuses/Orbits: Clear sinuses.  Unremarkable orbits. Other: No mastoid effusions IMPRESSION: 1. Acute or subacute right anterior MCA territory infarct. Mild mass effect with 3 mm of leftward midline shift. Mild areas of relative hyperdensity within the infarct could represent petechial hemorrhage or spared brain parenchyma. MRI could further characterize. 2. Remote inferior left cerebellar infarct. Electronically Signed   By: Feliberto Harts M.D.   On: 07/18/2021 15:18   MR BRAIN WO CONTRAST  Addendum Date: 07/18/2021   ADDENDUM REPORT: 07/18/2021 17:14 ADDENDUM: These results were called  by telephone at the time of interpretation on 07/18/2021 at 5:13 pm to provider [facility] , who verbally acknowledged these results. Electronically Signed   By: Marlan Palau M.D.   On: 07/18/2021 17:14   Result Date: 07/18/2021 CLINICAL DATA:  Acute neuro deficit.  Stroke.  Left-sided weakness. EXAM: MRI HEAD WITHOUT CONTRAST TECHNIQUE: Multiplanar, multiecho pulse sequences of the brain and surrounding structures were obtained without intravenous contrast. COMPARISON:  CT head  07/18/2021 FINDINGS: Brain: Acute infarct right MCA territory. Infarct involves the operculum and right frontal lobe. Mild associated petechial hemorrhage in the infarct. Sparing of the basal ganglia and posterior division of the right MCA. Ventricle size normal.  Slight midline shift to the left. Chronic infarct left inferior cerebellum. Mild chronic microvascular ischemic change in the white matter. No mass lesion identified. Vascular: Normal arterial flow voids. Skull and upper cervical spine: Negative Sinuses/Orbits: Paranasal sinuses clear.  Negative orbit Other: None IMPRESSION: Acute infarct right MCA territory with mild petechial hemorrhage. Mild mass-effect and midline shift to the left. Mild chronic microvascular ischemic change in the white matter. Small chronic infarct left PICA territory. Electronically Signed: By: Marlan Palau M.D. On: 07/18/2021 17:11    Labs on Admission: I have personally reviewed following labs  CBC: Recent Labs  Lab 07/18/21 1432  WBC 9.7  NEUTROABS 7.5  HGB 14.2  HCT 40.8  MCV 89.7  PLT 273   Basic Metabolic Panel: Recent Labs  Lab 07/18/21 1432  NA 137  K 3.6  CL 103  CO2 21*  GLUCOSE 161*  BUN 30*  CREATININE 1.67*  CALCIUM 9.5   GFR: Estimated Creatinine Clearance: 48 mL/min (A) (by C-G formula based on SCr of 1.67 mg/dL (H)).  Liver Function Tests: Recent Labs  Lab 07/18/21 1432  AST 29  ALT 14  ALKPHOS 33*  BILITOT 1.0  PROT 7.6  ALBUMIN 4.4   Coagulation Profile: Recent Labs  Lab 07/18/21 1432  INR 1.1   Dr. Sedalia Muta [facility]  If 7PM-7AM, please contact overnight-coverage provider If 7AM-7PM, please contact day coverage provider [email or web address]  07/19/2021, 12:23 AM

## 2021-07-18 NOTE — Assessment & Plan Note (Signed)
-   Amlodipine 10 mg daily, atenolol-chlorthalidone 50-25, 1 tablet, mg daily ?- Hydralazine 5 mg IV every 4 hours as needed for SBP greater than 160, 2 days ordered ?

## 2021-07-18 NOTE — Hospital Course (Addendum)
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 ? ?Initial vitals in the emergency department showed temperature of 97.7, respiration rate of 18, heart rate 52, blood pressure 158/86, SPO2 of 99% on room air. ? ?Serum sodium 137, potassium 3.6, chloride 103, bicarb 21, BUN of 30, serum creatinine of 1.67, nonfasting blood glucose 161, GFR 43.  WBC 9.7, hemoglobin 14.2, platelets of 273. ? ?COVID/influenza A/influenza B PCR were negative. ? ?CT the head without contrast in the ED was read as acute or subacute right anterior MCA territory infarct.  Mild mass effect with 3 mm leftward midline shift.  Mild areas of relative hyperdensity within the infarct could represent petechial hemorrhage or spared brain parenchyma.  Remote inferior left cerebellar infarct. ? ?MRI of the brain without contrast: Acute infarct right MCA territory with mild petechial hemorrhage.  Mild mass effect and midline shift to the left.  Mild chronic microvascular ischemic changes in the white matter.  Small chronic infarct left PICA territory. ? ?CTA of the head neck with and without contrast: Poor perfusion of the superior branch of the right MCA likely due to distal emboli.  This corresponds to the area of acute infarct on CT and MRI.  Mild atherosclerotic disease carotid bifurcation bilaterally without significant stenosis.  Mild stenosis cavernous carotid bilaterally.  Moderate supraclinoid internal carotid artery stenosis on the left.  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  Mild stenosis distal right vertebral artery. ?

## 2021-07-18 NOTE — Consult Note (Addendum)
NEURO HOSPITALIST CONSULT NOTE   Requesting physician: Dr. Tobie Poet  Reason for Consult: Slurred speech and left sided weakness  History obtained from:  Patient, Daughter and Chart     HPI:                                                                                                                                          George Schmidt is an 71 y.o. male with a PMHx of basal cell carcinoma and squamous cell carcinoma of skin, DM, HLD and HTN who presented to the ED this afternoon with a c/c of left sided facial droop with left arm and leg weakness in conjunction with slurred and slowed speech since Sunday. He was also noted to be drooling yesterday.    CT head revealed a subacute right MCA territory ischemic infarction.   Estimated GFR is 43 based on his Cr of 1.67. CBC is normal. Glucose elevated at 161.   Home medications include Plavix and atorvastatin.   Past Medical History:  Diagnosis Date   Arthritis    Basal cell carcinoma 08/25/2019   Left ant. shoulder latera. Superficial and nodular.    Basal cell carcinoma 05/25/2020   L upper back lateral - ED&C   Basal cell carcinoma 05/25/2020   L shoulder posterior adjacent to white patch - ED&C   Basal cell carcinoma 05/25/2020   L upper medial back - ED&C   Diabetes mellitus without complication ([place])    Hemorrhoids    Hyperlipidemia    Hypertension    Squamous cell carcinoma of skin 04/27/2020   R temple - MOHS 06/16/20 at [place]    Past Surgical History:  Procedure Laterality Date   COLONOSCOPY WITH PROPOFOL N/A 10/21/2014   Procedure: COLONOSCOPY WITH PROPOFOL;  Surgeon: Josefine Class, MD;  Location: [facility] ENDOSCOPY;  Service: Endoscopy;  Laterality: N/A;    Family History  Problem Relation Age of Onset   Heart attack Mother    Clotting disorder Father             Social History:  reports that he has quit smoking. He has never used smokeless tobacco. He reports current alcohol  use of about 1.0 standard drink per week. He reports that he does not use drugs.  Allergies  Allergen Reactions   Amoxicillin Hives   Clarithromycin Hives    HOME MEDICATIONS:  No current facility-administered medications on file prior to encounter.   Current Outpatient Medications on File Prior to Encounter  Medication Sig Dispense Refill   amLODipine (NORVASC) 10 MG tablet Take 10 mg by mouth daily.     atenolol-chlorthalidone (TENORETIC) 50-25 MG per tablet Take 1 tablet by mouth daily.     atorvastatin (LIPITOR) 40 MG tablet Take 1 tablet (40 mg total) by mouth daily. 30 tablet 11   clopidogrel (PLAVIX) 75 MG tablet Take 1 tablet (75 mg total) by mouth daily. 90 tablet 0   fenofibrate 160 MG tablet Take 160 mg by mouth daily.     ibuprofen (ADVIL) 200 MG tablet Take 200 mg by mouth every 6 (six) hours as needed.     ketoconazole (NIZORAL) 2 % cream For scale apply to the ears BID. 60 g 1   metFORMIN (GLUCOPHAGE-XR) 500 MG 24 hr tablet Take 2 tablets by mouth once daily     mupirocin ointment (BACTROBAN) 2 % Apply 1 application topically daily. (Patient not taking: Reported on 07/12/2021) 22 g 0   potassium chloride SA (K-DUR,KLOR-CON) 20 MEQ tablet Take 20 mEq by mouth 2 (two) times daily.     sildenafil (VIAGRA) 100 MG tablet Take by mouth.        ROS:                                                                                                                                       As per HPI. Does not endorse any additional complaints.    Blood pressure (!) 158/86, pulse (!) 52, temperature 97.7 F (36.5 C), temperature source Oral, resp. rate 18, height '5\' 9"'$  (1.753 m), weight 106.1 kg, SpO2 99 %.   General Examination:                                                                                                       Physical Exam  HEENT-  [place]/AT    Lungs- Respirations unlabored Extremities- No edema  Neurological Examination Mental Status: Awake and alert. Fully oriented. Speech is mildly dysarthric but fluent with intact comprehension and naming. Cranial Nerves: II: Visual fields with equivocal left superior quadrantanopsia. Positive for left sided extinction with DSS.   III,IV, VI: No ptosis. EOMI. No nystagmus.  V: Temp sensation equal bilaterally. FT intact, but with extinction to DSS on the left.  VII: Left facial droop.  VIII: Hearing intact to voicey IX,X: No hoarseness or hypophonia XI: Significant weakness on  the left with shoulder shrug XII: Tongue deviates slightly to the left Motor: RUE and RLE 5/5 LUE 4/5 proximally and distally. No pronator drift, but arm is held lower than the right when testing [place] LLE 4+/5 proximally and distally  Sensory: Temp and FT intact bilaterally. No extinction to DSS but patient with limited cooperation to this portion of the exam.  Deep Tendon Reflexes: 1+ bilateral brachioradialis and biceps. Trace patellar reflexes.  Plantars: Right: downgoing   Left: downgoing Cerebellar: No ataxia with FNF bilaterally, but slow on the left.  Gait: Deferred   Lab Results: Basic Metabolic Panel: Recent Labs  Lab 07/18/21 1432  NA 137  K 3.6  CL 103  CO2 21*  GLUCOSE 161*  BUN 30*  CREATININE 1.67*  CALCIUM 9.5    CBC: Recent Labs  Lab 07/18/21 1432  WBC 9.7  NEUTROABS 7.5  HGB 14.2  HCT 40.8  MCV 89.7  PLT 273    Cardiac Enzymes: No results for input(s): CKTOTAL, CKMB, CKMBINDEX, TROPONINI in the last 168 hours.  Lipid Panel: No results for input(s): CHOL, TRIG, HDL, CHOLHDL, VLDL, LDLCALC in the last 168 hours.  Imaging: CT ANGIO HEAD NECK W WO CM  Result Date: 07/18/2021 CLINICAL DATA:  Stroke.  Left-sided weakness.  Speech abnormality EXAM: CT ANGIOGRAPHY HEAD AND NECK TECHNIQUE: Multidetector CT imaging of the head and neck was performed using the standard protocol  during bolus administration of intravenous contrast. Multiplanar CT image reconstructions and MIPs were obtained to evaluate the vascular anatomy. Carotid stenosis measurements (when applicable) are obtained utilizing NASCET criteria, using the distal internal carotid diameter as the denominator. RADIATION DOSE REDUCTION: This exam was performed according to the departmental dose-optimization program which includes automated exposure control, adjustment of the mA and/or kV according to patient size and/or use of iterative reconstruction technique. CONTRAST:  63m OMNIPAQUE IOHEXOL 350 MG/ML SOLN COMPARISON:  CT head and MRI head 07/18/2021. CT angio head neck 11/30/2019 FINDINGS: CTA NECK FINDINGS Aortic arch: Standard branching. Imaged portion shows no evidence of aneurysm or dissection. No significant stenosis of the major arch vessel origins. Mild atherosclerotic calcification aortic arch and proximal great vessels Right carotid system: Mild atherosclerotic disease right carotid bifurcation without significant stenosis. No plaque ulceration or thrombus. Left carotid system: Mild atherosclerotic disease left carotid bifurcation. Negative for stenosis. No plaque ulceration or thrombus. Vertebral arteries: Right vertebral artery is patent to the basilar without significant stenosis Left vertebral artery has decreased enhancement proximally and then occludes at C2. Left vertebral artery remains occluded and does not contribute to the basilar. 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  No acute skeletal abnormality. Other neck: Negative for mass or adenopathy in the neck. Upper chest: Lung apices clear bilaterally. Review of the MIP images confirms the above findings CTA HEAD FINDINGS Anterior circulation: Atherosclerotic calcification in the cavernous carotid bilaterally with mild stenosis bilaterally moderate stenosis left supraclinoid internal  carotid artery. Right M1 widely patent. Right middle cerebral artery bifurcation patent. There is decreased opacification of the superior division of the right MCA with probable small branch occlusions. This correlates with the right MCA infarct on MRI and CT. Inferior division right MCA widely patent. Anterior cerebral arteries patent bilaterally without stenosis. Left MCA patent without stenosis. Posterior circulation: Right vertebral artery supplies the basilar. Mild stenosis distally. Right PICA patent. Distal left vertebral artery is occluded. Left PICA not visualized. Basilar widely patent. Superior cerebellar and posterior cerebral arteries  patent bilaterally. Venous sinuses: Limited venous enhancement due to arterial phase scanning. Anatomic variants: None Review of the MIP images confirms the above findings IMPRESSION: 1. Poor perfusion of the superior branch of the right MCA likely due to distal emboli. This corresponds to the area of acute infarct on CT and MRI. 2. Mild atherosclerotic disease carotid bifurcation bilaterally without significant stenosis. Mild stenosis cavernous carotid bilaterally. Moderate supraclinoid internal carotid artery stenosis on the left 3. 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 Mild stenosis distal right vertebral artery. 4. These results were called by telephone at the time of interpretation on 07/18/2021 at 5:42 pm to provider [facility] , who verbally acknowledged these results. Electronically Signed   By: Franchot Gallo M.D.   On: 07/18/2021 17:44   CT HEAD WO CONTRAST  Result Date: 07/18/2021 CLINICAL DATA:  Stroke, follow up EXAM: CT HEAD WITHOUT CONTRAST TECHNIQUE: Contiguous axial images were obtained from the base of the skull through the vertex without intravenous contrast. RADIATION DOSE REDUCTION: This exam was performed according to the departmental dose-optimization program which includes automated  exposure control, adjustment of the mA and/or kV according to patient size and/or use of iterative reconstruction technique. COMPARISON:  11/30/2019. FINDINGS: Brain: Hypoattenuation loss of gray-white differentiation in the right insula and overlying frontal lobe, compatible with acute or subacute right anterior MCA distribution infarct. Mild areas of relative hyperdensity within the infarct could represent petechial hemorrhage or spared brain parenchyma. Mild mass effect with 3 mm of leftward midline shift at the foramen of [place]. Remote inferior left cerebellar infarct. Vascular: No hyperdense vessel identified. Calcific intracranial atherosclerosis Skull: No acute fracture. Sinuses/Orbits: Clear sinuses.  Unremarkable orbits. Other: No mastoid effusions IMPRESSION: 1. Acute or subacute right anterior MCA territory infarct. Mild mass effect with 3 mm of leftward midline shift. Mild areas of relative hyperdensity within the infarct could represent petechial hemorrhage or spared brain parenchyma. MRI could further characterize. 2. Remote inferior left cerebellar infarct. Electronically Signed   By: Margaretha Sheffield M.D.   On: 07/18/2021 15:18   MR BRAIN WO CONTRAST  Addendum Date: 07/18/2021   ADDENDUM REPORT: 07/18/2021 17:14 ADDENDUM: These results were called by telephone at the time of interpretation on 07/18/2021 at 5:13 pm to provider [facility] , who verbally acknowledged these results. Electronically Signed   By: Franchot Gallo M.D.   On: 07/18/2021 17:14   Result Date: 07/18/2021 CLINICAL DATA:  Acute neuro deficit.  Stroke.  Left-sided weakness. EXAM: MRI HEAD WITHOUT CONTRAST TECHNIQUE: Multiplanar, multiecho pulse sequences of the brain and surrounding structures were obtained without intravenous contrast. COMPARISON:  CT head 07/18/2021 FINDINGS: Brain: Acute infarct right MCA territory. Infarct involves the operculum and right frontal lobe. Mild associated petechial hemorrhage in the  infarct. Sparing of the basal ganglia and posterior division of the right MCA. Ventricle size normal.  Slight midline shift to the left. Chronic infarct left inferior cerebellum. Mild chronic microvascular ischemic change in the white matter. No mass lesion identified. Vascular: Normal arterial flow voids. Skull and upper cervical spine: Negative Sinuses/Orbits: Paranasal sinuses clear.  Negative orbit Other: None IMPRESSION: Acute infarct right MCA territory with mild petechial hemorrhage. Mild mass-effect and midline shift to the left. Mild chronic microvascular ischemic change in the white matter. Small chronic infarct left PICA territory. Electronically Signed: By: Franchot Gallo M.D. On: 07/18/2021 17:11     Assessment: 71 y.o. male with a PMHx of DM, HLD and  HTN who presented to the ED this afternoon with a chief complaint of left sided facial droop with left arm and leg weakness in conjunction with slurred and slowed speech since Sunday.  1. Exam reveals neurological deficits that are referable to the right MCA territory stroke seen on MRI.  2. CT head: Subacute right anterior MCA territory infarct. Mild mass effect with 3 mm of leftward midline shift. Mild areas of relative hyperdensity within the infarct could represent petechial hemorrhage or, less likely, spared brain parenchyma. Remote inferior left cerebellar infarct is also noted.  3. MRI brain: Acute infarct right MCA territory with mild petechial hemorrhage. Mild mass-effect and midline shift to the left. Mild chronic microvascular ischemic change in the white matter. Small chronic infarct left PICA territory. 4. CTA of head and neck: Poor perfusion of the superior branch of the right MCA likely due to distal emboli. This corresponds to the area of acute infarct on CT and MRI. Mild atherosclerotic disease carotid bifurcation bilaterally without significant stenosis. Mild stenosis cavernous carotid bilaterally. Moderate supraclinoid internal  carotid artery stenosis on the left. 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 Mild stenosis distal right vertebral artery. 5. EKG: Sinus bradycardia with 1st degree A-V block with premature atrial complexes. Inferior infarct (cited on or before 30-Nov-2019). Anterior infarct, age undetermined. Interval changes relative to the 2021 EKG are further documented in the report.  6. Stroke risk factors: DM, HLD and HTN  Recommendations: 1. The patient has failed Plavix monotherapy for stroke prevention. Add ASA and continue DAPT indefinitely.  2. Continue atorvastatin.  3. HgbA1c, fasting lipid panel 4. TTE 5. PT consult, OT consult, Speech consult 6. Risk factor modification 7. Telemetry monitoring 8. Frequent neuro checks 9, NPO until passes stroke swallow screen   Electronically signed: Dr. Kerney Elbe 07/18/2021, 8:02 PM

## 2021-07-18 NOTE — ED Triage Notes (Signed)
Pt comes pov with left sided facial droop, left sided weakness, especially in his left hand starting 3 days ago (Sunday).  ?

## 2021-07-18 NOTE — Assessment & Plan Note (Addendum)
-   With mild petechial hemorrhage ?- Neurosurgeon has been consulted and recommends no decompression procedures at this time ?- Neurology, Dr. [name] has been consulted and we appreciate further recommendations ?- Complete echo with bubble study ordered ?- Frequent neurochecks ?- PT, OT, speech consulted ?- Fall and aspiration precautions ?- N.p.o. pending passing swallow study ?- Avoid NSAIDs, aspirin, Plavix, DVT prophylaxis at this time due to petechial hemorrhage ?- Resumed atorvastatin 40 mg nightly ?- Admit to telemetry cardiac, observation ?

## 2021-07-18 NOTE — ED Notes (Signed)
Attending Provider at bedside at this time  ?

## 2021-07-18 NOTE — ED Provider Notes (Signed)
[facility] Provider Note  Patient Contact: 3:27 PM (approximate)   History   Stroke Symptoms   HPI  George Schmidt is a 71 y.o. male who presents the emergency department complaining of slurred speech, facial droop, unilateral weakness.  According to the daughter the patient has been confused, slurring his words, appearing weak.  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  According to the patient and the daughter symptoms appeared roughly 3 days ago.  It appears that they were possibly worse than yesterday but again were noted 3 days ago.  Patient denies any significant headache at this time, visual changes.  Patient has obvious facial droop on the left side.  Denies any chest pain, shortness of breath.  No recent trauma to include falls or motor vehicle collision.  Patient does take blood thinners and states that he has been taking these as prescribed.  Patient is on Plavix.  History of hypertension, diabetes, previous cerebellar stroke.     Physical Exam   Triage Vital Signs: ED Triage Vitals  Enc Vitals Group     BP 07/18/21 1427 (!) 158/86     Pulse Rate 07/18/21 1425 (!) 52     Resp 07/18/21 1425 18     Temp 07/18/21 1425 97.7 F (36.5 C)     Temp Source 07/18/21 1425 Oral     SpO2 07/18/21 1425 99 %     Weight 07/18/21 1426 234 lb (106.1 kg)     Height 07/18/21 1426 '5\' 9"'$  (1.753 m)     Head Circumference --      Peak Flow --      Pain Score 07/18/21 1425 0     Pain Loc --      Pain Edu? --      Excl. in [place]? --     Most recent vital signs: Vitals:   07/18/21 2130 07/18/21 2200  BP: 135/71 (!) 145/66  Pulse: (!) 52 (!) 49  Resp: 15 13  Temp:    SpO2: 99% 98%     General: Alert and in no acute distress. Eyes:  PERRL. EOMI with deficits as noted below and neuro exam Head: No acute traumatic findings  Neck: No stridor. No cervical spine tenderness to  palpation. Hematological/Lymphatic/Immunilogical: No cervical lymphadenopathy. Cardiovascular:  Good peripheral perfusion Respiratory: Normal respiratory effort without tachypnea or retractions. Lungs CTAB. Good air entry to the bases with no decreased or absent breath sounds. Musculoskeletal: Full range of motion to all extremities.  Neurologic: Patient has not obvious left-sided facial droop on initial assessment.  Patient has difficulty with ocular motion testing specifically with visualization to the left side.  He has difficulty tracking with all motions on the left side.  Obvious facial droop with smiling.  He is able to puff out both cheeks but does appear to have slight shift in his tongue.  Patient does have pronator drift on the right side but has slight decrease in grip strength on the left side. Skin:   No rash noted Other:   ED Results / Procedures / Treatments   Labs (all labs ordered are listed, but only abnormal results are displayed) Labs Reviewed  COMPREHENSIVE METABOLIC PANEL - Abnormal; Notable for the following components:      Result Value   CO2 21 (*)    Glucose, Bld 161 (*)    BUN 30 (*)    Creatinine, Ser 1.67 (*)  Alkaline Phosphatase 33 (*)    GFR, Estimated 43 (*)    All other components within normal limits  RESP PANEL BY RT-PCR (FLU A&B, COVID) ARPGX2  PROTIME-INR  APTT  CBC  DIFFERENTIAL  URINE DRUG SCREEN, QUALITATIVE ([facility] ONLY)  HEMOGLOBIN A1C  LIPID PANEL  I-STAT CREATININE, ED  CBG MONITORING, ED     EKG  ED ECG REPORT I, Charline Bills Tion Tse,  personally viewed and interpreted this ECG.   Date: 07/18/2021  EKG Time: 1708 hrs.  Rate: 61 bpm  Rhythm: unchanged from previous tracings, normal sinus rhythm, 1st degree AV block, sinus arrhythmia  Axis: Left axis deviation  Intervals:first-degree A-V block   ST&T Change: No ST elevation or depression noted  Normal sinus rhythm with sinus arrhythmia, first-degree AV block.  Largely  unchanged from previous EKG from 11/30/2019    RADIOLOGY  I personally viewed and evaluated these images as part of my medical decision making, as well as reviewing the written report by the radiologist.  ED Provider Interpretation: Visualization of the CT scan reveals what appears to be an acute right anterior MCA region infarct.  Patient has some mass effect with midline shift and areas that could be representative of petechial hemorrhage.  This is in keeping with patient's sudden onset of symptoms beginning 3 days ago.  CT ANGIO HEAD NECK W WO CM  Result Date: 07/18/2021 CLINICAL DATA:  Stroke.  Left-sided weakness.  Speech abnormality EXAM: CT ANGIOGRAPHY HEAD AND NECK TECHNIQUE: Multidetector CT imaging of the head and neck was performed using the standard protocol during bolus administration of intravenous contrast. Multiplanar CT image reconstructions and MIPs were obtained to evaluate the vascular anatomy. Carotid stenosis measurements (when applicable) are obtained utilizing NASCET criteria, using the distal internal carotid diameter as the denominator. RADIATION DOSE REDUCTION: This exam was performed according to the departmental dose-optimization program which includes automated exposure control, adjustment of the mA and/or kV according to patient size and/or use of iterative reconstruction technique. CONTRAST:  58m OMNIPAQUE IOHEXOL 350 MG/ML SOLN COMPARISON:  CT head and MRI head 07/18/2021. CT angio head neck 11/30/2019 FINDINGS: CTA NECK FINDINGS Aortic arch: Standard branching. Imaged portion shows no evidence of aneurysm or dissection. No significant stenosis of the major arch vessel origins. Mild atherosclerotic calcification aortic arch and proximal great vessels Right carotid system: Mild atherosclerotic disease right carotid bifurcation without significant stenosis. No plaque ulceration or thrombus. Left carotid system: Mild atherosclerotic disease left carotid bifurcation. Negative  for stenosis. No plaque ulceration or thrombus. Vertebral arteries: Right vertebral artery is patent to the basilar without significant stenosis Left vertebral artery has decreased enhancement proximally and then occludes at C2. Left vertebral artery remains occluded and does not contribute to the basilar. 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  No acute skeletal abnormality. Other neck: Negative for mass or adenopathy in the neck. Upper chest: Lung apices clear bilaterally. Review of the MIP images confirms the above findings CTA HEAD FINDINGS Anterior circulation: Atherosclerotic calcification in the cavernous carotid bilaterally with mild stenosis bilaterally moderate stenosis left supraclinoid internal carotid artery. Right M1 widely patent. Right middle cerebral artery bifurcation patent. There is decreased opacification of the superior division of the right MCA with probable small branch occlusions. This correlates with the right MCA infarct on MRI and CT. Inferior division right MCA widely patent. Anterior cerebral arteries patent bilaterally without stenosis. Left MCA patent without stenosis. Posterior circulation: Right vertebral artery supplies  the basilar. Mild stenosis distally. Right PICA patent. Distal left vertebral artery is occluded. Left PICA not visualized. Basilar widely patent. Superior cerebellar and posterior cerebral arteries patent bilaterally. Venous sinuses: Limited venous enhancement due to arterial phase scanning. Anatomic variants: None Review of the MIP images confirms the above findings IMPRESSION: 1. Poor perfusion of the superior branch of the right MCA likely due to distal emboli. This corresponds to the area of acute infarct on CT and MRI. 2. Mild atherosclerotic disease carotid bifurcation bilaterally without significant stenosis. Mild stenosis cavernous carotid bilaterally. Moderate supraclinoid internal  carotid artery stenosis on the left 3. 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 Mild stenosis distal right vertebral artery. 4. These results were called by telephone at the time of interpretation on 07/18/2021 at 5:42 pm to provider [facility] , who verbally acknowledged these results. Electronically Signed   By: Franchot Gallo M.D.   On: 07/18/2021 17:44   CT HEAD WO CONTRAST  Result Date: 07/18/2021 CLINICAL DATA:  Stroke, follow up EXAM: CT HEAD WITHOUT CONTRAST TECHNIQUE: Contiguous axial images were obtained from the base of the skull through the vertex without intravenous contrast. RADIATION DOSE REDUCTION: This exam was performed according to the departmental dose-optimization program which includes automated exposure control, adjustment of the mA and/or kV according to patient size and/or use of iterative reconstruction technique. COMPARISON:  11/30/2019. FINDINGS: Brain: Hypoattenuation loss of gray-white differentiation in the right insula and overlying frontal lobe, compatible with acute or subacute right anterior MCA distribution infarct. Mild areas of relative hyperdensity within the infarct could represent petechial hemorrhage or spared brain parenchyma. Mild mass effect with 3 mm of leftward midline shift at the foramen of [place]. Remote inferior left cerebellar infarct. Vascular: No hyperdense vessel identified. Calcific intracranial atherosclerosis Skull: No acute fracture. Sinuses/Orbits: Clear sinuses.  Unremarkable orbits. Other: No mastoid effusions IMPRESSION: 1. Acute or subacute right anterior MCA territory infarct. Mild mass effect with 3 mm of leftward midline shift. Mild areas of relative hyperdensity within the infarct could represent petechial hemorrhage or spared brain parenchyma. MRI could further characterize. 2. Remote inferior left cerebellar infarct. Electronically Signed   By: Margaretha Sheffield M.D.   On: 07/18/2021  15:18   MR BRAIN WO CONTRAST  Addendum Date: 07/18/2021   ADDENDUM REPORT: 07/18/2021 17:14 ADDENDUM: These results were called by telephone at the time of interpretation on 07/18/2021 at 5:13 pm to provider [facility] , who verbally acknowledged these results. Electronically Signed   By: Franchot Gallo M.D.   On: 07/18/2021 17:14   Result Date: 07/18/2021 CLINICAL DATA:  Acute neuro deficit.  Stroke.  Left-sided weakness. EXAM: MRI HEAD WITHOUT CONTRAST TECHNIQUE: Multiplanar, multiecho pulse sequences of the brain and surrounding structures were obtained without intravenous contrast. COMPARISON:  CT head 07/18/2021 FINDINGS: Brain: Acute infarct right MCA territory. Infarct involves the operculum and right frontal lobe. Mild associated petechial hemorrhage in the infarct. Sparing of the basal ganglia and posterior division of the right MCA. Ventricle size normal.  Slight midline shift to the left. Chronic infarct left inferior cerebellum. Mild chronic microvascular ischemic change in the white matter. No mass lesion identified. Vascular: Normal arterial flow voids. Skull and upper cervical spine: Negative Sinuses/Orbits: Paranasal sinuses clear.  Negative orbit Other: None IMPRESSION: Acute infarct right MCA territory with mild petechial hemorrhage. Mild mass-effect and midline shift to the left. Mild chronic microvascular ischemic change in the white matter. Small chronic infarct left  PICA territory. Electronically Signed: By: Franchot Gallo M.D. On: 07/18/2021 17:11    PROCEDURES:  Critical Care performed: Yes, see critical care procedure note(s)  .Critical Care Performed by: Darletta Moll, PA-C Authorized by: Darletta Moll, PA-C   Critical care provider statement:    Critical care time (minutes):  60   Critical care time was exclusive of:  Separately billable procedures and treating other patients and teaching time   Critical care was necessary to treat or prevent imminent  or life-threatening deterioration of the following conditions:  CNS failure or compromise   Critical care was time spent personally by me on the following activities:  Obtaining history from patient or surrogate, examination of patient, discussions with consultants, development of treatment plan with patient or surrogate, ordering and performing treatments and interventions, ordering and review of laboratory studies, ordering and review of radiographic studies, re-evaluation of patient's condition and review of old charts   I assumed direction of critical care for this patient from another provider in my specialty: no     MEDICATIONS ORDERED IN ED: Medications  sodium chloride flush (NS) 0.9 % injection 3 mL (3 mLs Intravenous Not Given 07/18/21 1505)  atorvastatin (LIPITOR) tablet 40 mg (0 mg Oral Hold 07/18/21 2227)  metFORMIN (GLUCOPHAGE-XR) 24 hr tablet 500 mg (has no administration in time range)   stroke: mapping our early stages of recovery book ( Does not apply Not Given 07/18/21 1958)  acetaminophen (TYLENOL) tablet 650 mg (has no administration in time range)    Or  acetaminophen (TYLENOL) 160 MG/5ML solution 650 mg (has no administration in time range)    Or  acetaminophen (TYLENOL) suppository 650 mg (has no administration in time range)  hydrALAZINE (APRESOLINE) injection 5 mg (has no administration in time range)  iohexol (OMNIPAQUE) 350 MG/ML injection 50 mL (50 mLs Intravenous Contrast Given 07/18/21 1713)     IMPRESSION / MDM / [place] / ED COURSE  I reviewed the triage vital signs and the nursing notes.                              Differential diagnosis includes, but is not limited to, ischemic stroke, hemorrhagic stroke, Bell's palsy   Patient's diagnosis is consistent with CVA.  Patient presented to the emergency department with sudden onset of strokelike symptoms 3 days ago.  Patient does have a history of a sellar burglar stroke and states that family  recognized symptoms 3 days ago but he did not present for evaluation.  Evidently the symptoms had remained the same for 2 days and then also had an acute worsening yesterday.  There was a worsening of the same symptoms he had 3 days ago but were more pronounced.  He has facial droop, left-sided weakness, slurred speech.  Patient did have a concerning neuro exam and initial CT revealed what appeared to be an acute CVA in the right MCA distribution.  Patient has what also appears to be petechial hemorrhage and midline shift.  I discussed the patient with neurology as well as neurosurgery.  Neurology advises given the length of symptoms there is likely no intervention available to the patient.  I discussed with neurosurgery given the hemorrhage and at this time they do not feel that the patient warrants a surgical intervention unless there is more bleeding or her symptoms become more pronounced.  MRI and CT angio were also obtained for further evaluation which confirms  what appears to be an occlusive stroke in the right MCA with some hemorrhage in the same area.  Patient remains neurologically the same throughout the encounter.  There is no worsening of symptoms.  I have discussed the results at length with the patient and his daughter.  At this time we will admit to the hospitalist service..   Clinical Course as of 07/18/21 2254  Wed Jul 18, 2021  1545 I discussed the patient with neurosurgery and neurology.  Neurosurgery advises that this is likely a neuro consult as even though there is some petechial hemorrhage and midline shift there is no indication at this time for decompression.  If this changes the patient becomes more symptomatic with more hemorrhage can always reconsult neurosurgery. [JC]  [id number] Neurology at advises to perform MR brain, CTA instead of MR brain with MRA.  No recommended treatments at this time given the length of symptoms. [JC]    Clinical Course User Index [JC] Maryrose Colvin, Charline Bills,  PA-C     FINAL CLINICAL IMPRESSION(S) / ED DIAGNOSES   Final diagnoses:  None     Rx / DC Orders   ED Discharge Orders     None        Note:  This document was prepared using Dragon voice recognition software and may include unintentional dictation errors.   Darletta Moll, PA-C 07/18/21 2254    Vanessa Kennett, MD 07/21/21 [phone]

## 2021-07-18 NOTE — Assessment & Plan Note (Signed)
-   History of CKD 3A ?- Serum creatinine is 1.67/GFR 43, this appears to be progression to CKD 3B ?- BMP in the a.m. ?

## 2021-07-18 NOTE — ED Notes (Signed)
Pt to ED for slurred speech and L side weakness (mostly leg) since 2 days ago. Per daughter, pt speech is slow and slurred compared to normal and L face is slightly drooping. ? ?Pt was drooling yesterday. Pt passed swallow screen but then coughed afterward.  ? ? ?

## 2021-07-19 ENCOUNTER — Inpatient Hospital Stay (HOSPITAL_COMMUNITY)
Admit: 2021-07-19 | Discharge: 2021-07-19 | Disposition: A | Discharge status: Home / Self Care | Payer: Medicare Other | Attending: Internal Medicine | Admitting: Internal Medicine

## 2021-07-19 ENCOUNTER — Inpatient Hospital Stay: Payer: Medicare Other

## 2021-07-19 DIAGNOSIS — Z79899 Other long term (current) drug therapy: Secondary | ICD-10-CM | POA: Diagnosis not present

## 2021-07-19 DIAGNOSIS — Z85828 Personal history of other malignant neoplasm of skin: Secondary | ICD-10-CM | POA: Diagnosis not present

## 2021-07-19 DIAGNOSIS — Z881 Allergy status to other antibiotic agents status: Secondary | ICD-10-CM | POA: Diagnosis not present

## 2021-07-19 DIAGNOSIS — I639 Cerebral infarction, unspecified: Secondary | ICD-10-CM | POA: Diagnosis present

## 2021-07-19 DIAGNOSIS — Z7902 Long term (current) use of antithrombotics/antiplatelets: Secondary | ICD-10-CM | POA: Diagnosis not present

## 2021-07-19 DIAGNOSIS — Z87891 Personal history of nicotine dependence: Secondary | ICD-10-CM | POA: Diagnosis not present

## 2021-07-19 DIAGNOSIS — E1122 Type 2 diabetes mellitus with diabetic chronic kidney disease: Secondary | ICD-10-CM | POA: Diagnosis present

## 2021-07-19 DIAGNOSIS — Z20822 Contact with and (suspected) exposure to covid-19: Secondary | ICD-10-CM | POA: Diagnosis present

## 2021-07-19 DIAGNOSIS — G8194 Hemiplegia, unspecified affecting left nondominant side: Secondary | ICD-10-CM | POA: Diagnosis present

## 2021-07-19 DIAGNOSIS — E669 Obesity, unspecified: Secondary | ICD-10-CM | POA: Diagnosis present

## 2021-07-19 DIAGNOSIS — R29703 NIHSS score 3: Secondary | ICD-10-CM | POA: Diagnosis present

## 2021-07-19 DIAGNOSIS — Z8616 Personal history of COVID-19: Secondary | ICD-10-CM | POA: Diagnosis present

## 2021-07-19 DIAGNOSIS — Z7984 Long term (current) use of oral hypoglycemic drugs: Secondary | ICD-10-CM | POA: Diagnosis not present

## 2021-07-19 DIAGNOSIS — Z880 Allergy status to penicillin: Secondary | ICD-10-CM | POA: Diagnosis not present

## 2021-07-19 DIAGNOSIS — R4701 Aphasia: Secondary | ICD-10-CM | POA: Diagnosis present

## 2021-07-19 DIAGNOSIS — I63511 Cerebral infarction due to unspecified occlusion or stenosis of right middle cerebral artery: Secondary | ICD-10-CM

## 2021-07-19 DIAGNOSIS — E785 Hyperlipidemia, unspecified: Secondary | ICD-10-CM | POA: Diagnosis present

## 2021-07-19 DIAGNOSIS — I440 Atrioventricular block, first degree: Secondary | ICD-10-CM | POA: Diagnosis present

## 2021-07-19 DIAGNOSIS — Z832 Family history of diseases of the blood and blood-forming organs and certain disorders involving the immune mechanism: Secondary | ICD-10-CM | POA: Diagnosis not present

## 2021-07-19 DIAGNOSIS — Z8249 Family history of ischemic heart disease and other diseases of the circulatory system: Secondary | ICD-10-CM | POA: Diagnosis not present

## 2021-07-19 DIAGNOSIS — Z28311 Partially vaccinated for covid-19: Secondary | ICD-10-CM | POA: Diagnosis not present

## 2021-07-19 DIAGNOSIS — N1832 Chronic kidney disease, stage 3b: Secondary | ICD-10-CM | POA: Diagnosis present

## 2021-07-19 DIAGNOSIS — R29810 Facial weakness: Secondary | ICD-10-CM | POA: Diagnosis present

## 2021-07-19 DIAGNOSIS — I129 Hypertensive chronic kidney disease with stage 1 through stage 4 chronic kidney disease, or unspecified chronic kidney disease: Secondary | ICD-10-CM | POA: Diagnosis present

## 2021-07-19 DIAGNOSIS — I480 Paroxysmal atrial fibrillation: Secondary | ICD-10-CM | POA: Diagnosis not present

## 2021-07-19 LAB — ECHOCARDIOGRAM COMPLETE BUBBLE STUDY
AR max vel: 2.79 cm2
AV Area VTI: 3 cm2
AV Area mean vel: 2.46 cm2
AV Mean grad: 2 mmHg
AV Peak grad: 3.7 mmHg
Ao pk vel: 0.96 m/s
Area-P 1/2: 3.05 cm2
MV VTI: 1.8 cm2
S' Lateral: 3 cm

## 2021-07-19 LAB — LIPID PANEL
Cholesterol: 111 mg/dL (ref 0–200)
HDL: 26 mg/dL — ABNORMAL LOW (ref >40)
LDL Cholesterol: 58 mg/dL (ref 0–99)
Total CHOL/HDL Ratio: 4.3 RATIO
Triglycerides: 135 mg/dL (ref <150)
VLDL: 27 mg/dL (ref 0–40)

## 2021-07-19 LAB — HEMOGLOBIN A1C
Hgb A1c MFr Bld: 6 % — ABNORMAL HIGH (ref 4.8–5.6)
Mean Plasma Glucose: 126 mg/dL

## 2021-07-19 MED ORDER — ORAL CARE MOUTH RINSE
15.0000 mL | Freq: Two times a day (BID) | OROMUCOSAL | Status: DC
  Administered 2021-07-19: 15 mL via OROMUCOSAL
  Filled 2021-07-19 (×2): qty 15

## 2021-07-19 MED ORDER — ASPIRIN EC 81 MG PO TBEC
81.0000 mg | DELAYED_RELEASE_TABLET | Freq: Every day | ORAL | Status: DC
  Administered 2021-07-19 – 2021-07-20 (×2): 81 mg via ORAL
  Filled 2021-07-19 (×2): qty 1

## 2021-07-19 NOTE — Procedures (Signed)
Patient Name: George Schmidt  ?MRN: [id number]  ?Epilepsy Attending: Lora Havens  ?Referring Physician/Provider: Cox, Amy N, DO ?Date: 07/19/2021 ?Duration: 25.35 mins ? ?Patient history: 71 y.o. male with a PMHx of DM, HLD and HTN who presented to the ED this afternoon with a chief complaint of left sided facial droop with left arm and leg weakness in conjunction with slurred and slowed speech since Sunday.  EEG to evaluate for seizure ? ?Level of alertness: Awake, asleep ? ?AEDs during EEG study: None ? ?Technical aspects: This EEG study was done with scalp electrodes positioned according to the 10-20 International system of electrode placement. Electrical activity was acquired at a sampling rate of '500Hz'$  and reviewed with a high frequency filter of '70Hz'$  and a low frequency filter of '1Hz'$ . EEG data were recorded continuously and digitally stored.  ? ?Description: The posterior dominant rhythm consists of 8 Hz activity of moderate voltage (25-35 uV) seen predominantly in posterior head regions, symmetric and reactive to eye opening and eye closing. Sleep was characterized by vertex waves, sleep spindles (12 to 14 Hz), maximal frontocentral region.  EEG showed continuous 3 to 6 Hz theta-delta slowing in right frontotemporal region. Physiologic photic driving was not seen during photic stimulation.  Hyperventilation was not performed.    ? ?ABNORMALITY ?- Continuous slow, right frontotemporal region ? ?IMPRESSION: ?This study is suggestive of cortical dysfunction arising from right frontotemporal region likely secondary to underlying stroke. No seizures or definite epileptiform discharges were seen throughout the recording. ? ?Lora Havens  ? ?

## 2021-07-19 NOTE — Evaluation (Signed)
Occupational Therapy Evaluation ?Patient Details ?Name: George Schmidt ?MRN: [id number] ?DOB: [birth year] ?Today's Date: 07/19/2021 ? ? ?History of Present Illness Patient is a 71 year old male presenting to the ED with left side facial droop and left arm weakness along with slurred speech. CT of head revealed subacute right MCA territory ischemic infarction. Past medical history of basal cell carcinoma and squamous cell carcinoma of skin, DM, HLD and HTN.  ? ?Clinical Impression ?  ?Pt seen for OT evaluation this date in setting of acute hospitalization s/p CVA. Pt presents with L side facial droop with some subsequent drooling, and very mild dysdiadochokinesia with L UE slightly slower than R UE. FMC assessment doesn't appear to reveal notable deficits in R versus L. Pt able to perform all basic self care with MOD I to INDEP level and is INDEP with mobility w/ no AD. He is educated on BEFAST stroke s/s as well as mindfulness when chewing d/t decreased sensation to L side of mouth. Pt with good understanding. No further OT needs detected at this time. All education complete.  ?   ? ?Recommendations for follow up therapy are one component of a multi-disciplinary discharge planning process, led by the attending physician.  Recommendations may be updated based on patient status, additional functional criteria and insurance authorization.  ? ?Follow Up Recommendations ? No OT follow up  ?  ?Assistance Recommended at Discharge Set up Supervision/Assistance  ?Patient can return home with the following   ? ?  ?Functional Status Assessment ? Patient has had a recent decline in their functional status and demonstrates the ability to make significant improvements in function in a reasonable and predictable amount of time. (but functional decline not r/t to occupational therapy needs, more appropriate for speech therapy to address)  ?Equipment Recommendations ? None recommended by OT  ?  ?Recommendations for Other Services    ? ? ?  ?Precautions / Restrictions Precautions ?Precautions: Fall ?Restrictions ?Weight Bearing Restrictions: No  ? ?  ? ?Mobility Bed Mobility ?Overal bed mobility: Needs Assistance ?Bed Mobility: Supine to Sit, Sit to Supine ?  ?  ?Supine to sit: Modified independent (Device/Increase time) ?Sit to supine: Modified independent (Device/Increase time) ?  ?General bed mobility comments: increased time required to complete tasks. no physical assistance needed ?  ? ?Transfers ?Overall transfer level: Needs assistance ?Equipment used: None ?Transfers: Sit to/from Stand ?Sit to Stand: Modified independent (Device/Increase time) ?  ?  ?  ?  ?  ?General transfer comment: increased time, but no physical assistance required ?  ? ?  ?Balance Overall balance assessment: Needs assistance ?Sitting-balance support: Feet supported ?Sitting balance-Leahy Scale: Good ?  ?  ?Standing balance support: During functional activity, No upper extremity supported ?Standing balance-Leahy Scale: Good ?  ?  ?  ?  ?  ?  ?  ?  ?  ?  ?  ?  ?   ? ?ADL either performed or assessed with clinical judgement  ? ?ADL   ?  ?  ?  ?  ?  ?  ?  ?  ?  ?  ?  ?  ?  ?  ?  ?  ?  ?  ?  ?General ADL Comments: able to perform all basic ADLs w/ SUPV to MOD I including LB dressing. Limited with self-feeding, but primarily d/t decreased swallow/decreased oral sensation (ability to detect where an item is in his mouth) w/ noted drooling out of L side of mouth.  ? ? ? ?  Vision Baseline Vision/History: 1 Wears glasses ?Patient Visual Report: No change from baseline ?Vision Assessment?: Yes ?Eye Alignment: Within Functional Limits ?Ocular Range of Motion: Within Functional Limits ?Alignment/Gaze Preference: Within Defined Limits ?Tracking/Visual Pursuits: Able to track stimulus in all quads without difficulty  ?   ?Perception   ?  ?Praxis   ?  ? ?Pertinent Vitals/Pain Pain Assessment ?Pain Assessment: No/denies pain  ? ? ? ?Hand Dominance Right ?  ?Extremity/Trunk  Assessment Upper Extremity Assessment ?Upper Extremity Assessment: RUE deficits/detail;LUE deficits/detail ?RUE Deficits / Details: ROM WFL, MMT grossly 4 to 4+/5, not distinctly different than L hand, but pt reprots decreased ability to grasp and retain and item in L hand versus R. not seen on assessment despite several trials ?LUE Deficits / Details: pt c/o decreased grip strength in L hand. Not distinclty diffierent than R on assessment. Grip strength grossly 4/5 in L hand, he is able to reach and retrieve paper and separate sheets with L hand. reports equal sensation. Slightly decreased coordination with L hand with + dysdiadochokinesia on RAM, but very similar performance b/l on finger-to-nose test. ?  ?Lower Extremity Assessment ?Lower Extremity Assessment: Defer to PT evaluation ?LLE Deficits / Details: knee extension 4+/5 (patient does report bone on bone and knee joint crepitus at baseline). hip add/abd, dorsiflexion/plantarflexion 5/5 ?  ?  ?  ?Communication Communication ?Communication: No difficulties ?  ?Cognition Arousal/Alertness: Awake/alert ?Behavior During Therapy: Flat affect, WFL for tasks assessed/performed ?Overall Cognitive Status: Impaired/Different from baseline ?Area of Impairment: Problem solving, Following commands ?  ?  ?  ?  ?  ?  ?  ?  ?  ?  ?  ?Following Commands: Follows one step commands with increased time ?  ?  ?Problem Solving: Slow processing ?General Comments: patient is oriented x 4. increased time required to complete tasks. delay with verbal responses. intermittent drooling noted from left side of mouth during session. decreased eye contact with generally flat affect ?  ?  ?General Comments    ? ?  ?Exercises Other Exercises ?Other Exercises: OT ed re: role, BEFAST ?  ?Shoulder Instructions    ? ? ?Home Living Family/patient expects to be discharged to:: Private residence ?Living Arrangements: Spouse/significant other;Other relatives ?Available Help at Discharge:  Family ?Type of Home: Mobile home ?Home Access: Ramped entrance ?  ?  ?Home Layout: One level ?  ?  ?Bathroom Shower/Tub: Tub/shower unit ?  ?  ?  ?  ?Home Equipment: Rolling Walker (2 wheels);Grab bars - tub/shower ?  ?  ? Lives With: Spouse (mother-in-law; granddaughter) ? ?  ?Prior Functioning/Environment Prior Level of Function : Independent/Modified Independent;Driving ?  ?  ?  ?  ?  ?  ?Mobility Comments: independent ?ADLs Comments: independent ?  ? ?  ?  ?OT Problem List: Decreased coordination ?  ?   ?OT Treatment/Interventions:    ?  ?OT Goals(Current goals can be found in the care plan section) Acute Rehab OT Goals ?Patient Stated Goal: to go home ?OT Goal Formulation: All assessment and education complete, DC therapy  ?OT Frequency:   ?  ? ?Co-evaluation   ?  ?  ?  ?  ? ?  ?AM-PAC OT "6 Clicks" Daily Activity     ?Outcome Measure Help from another person eating meals?: A Little ?Help from another person taking care of personal grooming?: None ?Help from another person toileting, which includes using toliet, bedpan, or urinal?: None ?Help from another person bathing (including washing, rinsing, drying)?: None ?  Help from another person to put on and taking off regular upper body clothing?: None ?Help from another person to put on and taking off regular lower body clothing?: None ?6 Click Score: 23 ?  ?End of Session   ? ?Activity Tolerance: Patient tolerated treatment well ?Patient left: in bed;with call bell/phone within reach ? ?OT Visit Diagnosis: Other symptoms and signs involving the nervous system (R29.898)  ?              ?Time: 1275-1700 ?OT Time Calculation (min): 17 min ?Charges:  OT General Charges ?$OT Visit: 1 Visit ?OT Evaluation ?$OT Eval Low Complexity: 1 Low ?OT Treatments ?$Neuromuscular Re-education: 8-22 mins ? ?Gerrianne Scale, [place], OTR/L ?ascom [phone] ?07/19/21, 4:54 PM  ?

## 2021-07-19 NOTE — Plan of Care (Signed)
  Problem: Health Behavior/Discharge Planning: Goal: Ability to manage health-related needs will improve Outcome: Progressing   Problem: Clinical Measurements: Goal: Respiratory complications will improve Outcome: Progressing   Problem: Clinical Measurements: Goal: Cardiovascular complication will be avoided Outcome: Progressing   Problem: Activity: Goal: Risk for activity intolerance will decrease Outcome: Progressing   Problem: Safety: Goal: Ability to remain free from injury will improve Outcome: Progressing   

## 2021-07-19 NOTE — Evaluation (Signed)
Physical Therapy Evaluation Patient Details Name: George Schmidt MRN: [id number] DOB: 1951/02/01 Today's Date: 07/19/2021  History of Present Illness  Patient is a 71 year old male presenting to the ED with left side facial droop and left arm weakness along with slurred speech. CT of head revealed subacute right MCA territory ischemic infarction. Past medical history of basal cell carcinoma and squamous cell carcinoma of skin, DM, HLD and HTN.  Clinical Impression  PT evaluation completed. Patient reports he is independent at baseline with functional mobility but has bilateral knee arthritis with left knee joint crepitus.  The patient has mild left UE weakness noted on exam that does not appear to impact functional independence with mobility. He also has intermittent drooling noted from left side of the mouth. He has delayed responses at times but is oriented and able to follow all commands with extra time. The patient is modified independent with all mobility, including walking in hallway without assistive device. Good dynamic standing balance with high level balance activity including external trunk perturbations, head turns, directions changes with ambulation. Patient educated on fall prevention strategies and verbalized understanding. As patient is modified independent with functional activity, no apparent acute PT needs are identified at this time. Please re-consult PT if there are any changes with functional independence.      Recommendations for follow up therapy are one component of a multi-disciplinary discharge planning process, led by the attending physician.  Recommendations may be updated based on patient status, additional functional criteria and insurance authorization.  Follow Up Recommendations No PT follow up    Assistance Recommended at Discharge PRN  Patient can return home with the following  Assist for transportation    Equipment Recommendations None recommended by PT   Recommendations for Other Services       Functional Status Assessment Patient has not had a recent decline in their functional status     Precautions / Restrictions Precautions Precautions: Fall Restrictions Weight Bearing Restrictions: No      Mobility  Bed Mobility Overal bed mobility: Needs Assistance Bed Mobility: Supine to Sit, Sit to Supine     Supine to sit: Modified independent (Device/Increase time) Sit to supine: Modified independent (Device/Increase time)   General bed mobility comments: increased time required to complete tasks. no physical assistance needed    Transfers Overall transfer level: Needs assistance Equipment used: None Transfers: Sit to/from Stand Sit to Stand: Modified independent (Device/Increase time)           General transfer comment: increased time, but no physical assistance required    Ambulation/Gait Ambulation/Gait assistance: Modified independent (Device/Increase time) Gait Distance (Feet): 120 Feet Assistive device: None Gait Pattern/deviations: Step-through pattern       General Gait Details: no loss of balance with hallway ambulation without assistive device. patient reports having some difficulty with ambulation at baseline related to bilateral knee pain from arthritis. no difficulty with head turns  Financial trader Rankin (Stroke Patients Only)       Balance Overall balance assessment: Needs assistance Sitting-balance support: Feet supported Sitting balance-Leahy Scale: Good Sitting balance - Comments: with dynamic activity, reaching outside base of support with bilateral UE   Standing balance support: During functional activity, No upper extremity supported Standing balance-Leahy Scale: Good Standing balance comment: with functional activity, hallway ambulation             High level balance activites: Head turns,  Turns, Sudden stops High Level Balance Comments:  reaching outside base of support with unilateral UE and high level balance activity with no apparent deficits noted. external trunk perturbations in all directions in standing position with no trunk sway or loss of balance noted. patient was educated on fall prevention strategies including step strategy, widening base of support, UE support, etc.             Pertinent Vitals/Pain Pain Assessment Pain Assessment: No/denies pain    Home Living Family/patient expects to be discharged to:: Private residence Living Arrangements: Spouse/significant other;Other relatives Available Help at Discharge: Family Type of Home: Mobile home Home Access: Ramped entrance       Home Layout: One level Home Equipment: Conservation officer, nature (2 wheels);Grab bars - tub/shower      Prior Function Prior Level of Function : Independent/Modified Independent;Driving             Mobility Comments: independent ADLs Comments: independent     Hand Dominance   Dominant Hand: Right    Extremity/Trunk Assessment   Upper Extremity Assessment Upper Extremity Assessment: LUE deficits/detail;Defer to OT evaluation (RUE [facility] for tasks assessed) LUE Deficits / Details: shoulder flexion, elbow flexion/extension 4/5. good grip strength. see OT note for more details LUE Sensation: WNL LUE Coordination:  (patient reports dropping things with LUE this morning. slowed movement compared to right)    Lower Extremity Assessment Lower Extremity Assessment: RLE deficits/detail;LLE deficits/detail RLE Deficits / Details: knee extension, hip add/abd, dorsiflexion/plantarflexion 5/5 RLE Sensation: WNL RLE Coordination: WNL LLE Deficits / Details: knee extension 4+/5 (patient does report bone on bone and knee joint crepitus at baseline). hip add/abd, dorsiflexion/plantarflexion 5/5 LLE Sensation: WNL LLE Coordination: WNL (heel to shin without dysmetria)       Communication   Communication: No difficulties  Cognition  Arousal/Alertness: Awake/alert Behavior During Therapy: Flat affect, WFL for tasks assessed/performed Overall Cognitive Status: Impaired/Different from baseline Area of Impairment: Problem solving, Following commands                       Following Commands: Follows one step commands with increased time     Problem Solving: Slow processing General Comments: patient is oriented x 4. increased time required to complete tasks. delay with verbal responses. intermittent drooling noted from left side of mouth during session. decreased eye contact with generally flat affect        General Comments General comments (skin integrity, edema, etc.): patient required increased time for locating an object in left upper quadrant. he reports his vision has been intermittently impaired (reports seeing a splatter on the wall yesterday? ) but is generally safe with functional mobility and hallway ambulation without verbal cues    Exercises     Assessment/Plan    PT Assessment Patient does not need any further PT services  PT Problem List         PT Treatment Interventions      PT Goals (Current goals can be found in the Care Plan section)  Acute Rehab PT Goals Patient Stated Goal: to return home PT Goal Formulation: With patient Time For Goal Achievement: 07/19/21 Potential to Achieve Goals: Good    Frequency       Co-evaluation               AM-PAC PT "6 Clicks" Mobility  Outcome Measure Help needed turning from your back to your side while in a flat bed without using bedrails?: None Help needed moving  from lying on your back to sitting on the side of a flat bed without using bedrails?: None Help needed moving to and from a bed to a chair (including a wheelchair)?: None Help needed standing up from a chair using your arms (e.g., wheelchair or bedside chair)?: None Help needed to walk in hospital room?: None Help needed climbing 3-5 steps with a railing? : None 6 Click  Score: 24    End of Session   Activity Tolerance: Patient tolerated treatment well Patient left: in bed;with call bell/phone within reach Nurse Communication: Mobility status PT Visit Diagnosis: Other abnormalities of gait and mobility (R26.89)    Time: 1540-0867 PT Time Calculation (min) (ACUTE ONLY): 26 min   Charges:   PT Evaluation $PT Eval Low Complexity: 1 Low PT Treatments $Neuromuscular Re-education: 8-22 mins        Minna Merritts, PT, MPT   Percell Locus 07/19/2021, 12:11 PM

## 2021-07-19 NOTE — Progress Notes (Signed)
Eeg done 

## 2021-07-19 NOTE — Evaluation (Signed)
Clinical/Bedside Swallow Evaluation Patient Details  Name: George Schmidt MRN: [id number] Date of Birth: [birth year]  Today's Date: 07/19/2021 Time: SLP Start Time (ACUTE ONLY): 3810 SLP Stop Time (ACUTE ONLY): 0940 SLP Time Calculation (min) (ACUTE ONLY): 15 min  Past Medical History:  Past Medical History:  Diagnosis Date   Arthritis    Basal cell carcinoma 08/25/2019   Left ant. shoulder latera. Superficial and nodular.    Basal cell carcinoma 05/25/2020   L upper back lateral - ED&C   Basal cell carcinoma 05/25/2020   L shoulder posterior adjacent to white patch - ED&C   Basal cell carcinoma 05/25/2020   L upper medial back - ED&C   Diabetes mellitus without complication ([place])    Hemorrhoids    Hyperlipidemia    Hypertension    Squamous cell carcinoma of skin 04/27/2020   R temple - MOHS 06/16/20 at [facility]   Past Surgical History:  Past Surgical History:  Procedure Laterality Date   COLONOSCOPY WITH PROPOFOL N/A 10/21/2014   Procedure: COLONOSCOPY WITH PROPOFOL;  Surgeon: Josefine Class, MD;  Location: [facility] ENDOSCOPY;  Service: Endoscopy;  Laterality: N/A;   HPI:  Per [facility]BP[facility] H&P "Mr. 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   CT the head without contrast in the ED was read as acute or subacute right anterior MCA territory infarct.  Mild mass effect with 3 mm leftward midline shift.  Mild areas of relative hyperdensity within the infarct could represent petechial hemorrhage or spared brain parenchyma.  Remote inferior left cerebellar infarct. MRI of the brain without contrast: Acute infarct right MCA territory with mild petechial hemorrhage.  Mild mass effect  and midline shift to the left.  Mild chronic microvascular ischemic changes in the white matter.  Small chronic infarct left PICA territory.     CTA of the head neck with and without contrast: Poor perfusion of the superior branch of the right MCA likely due to distal emboli.  This corresponds to the area of acute infarct on CT and MRI.  Mild atherosclerotic disease carotid bifurcation bilaterally without significant stenosis.  Mild stenosis cavernous carotid bilaterally.  Moderate supraclinoid internal carotid artery stenosis on the left.  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  Mild stenosis distal right vertebral artery.     At bedside, he is able to tell me his name, age, current calendar year, and location of ER hospital.     He states that he had facial droop that started on Monday. [name], he developed slurring of speech. His wife and daughter made him come into hospital. He denies difficulty walking. He states his wife noticed, he did not eat like normal. He states in the ED, he was drinking lemonade, and he had difficulty, and was gagging.     He states he did not come to hospital on Monday because he states, 'I'm stubborn'.      He lost about 15 in one year. He has been drinking more water instead of soda."    Assessment / Plan / Recommendation  Clinical Impression  Pt seen for clinical swallowing evaluation. Pt alert, pleasant, and cooperative. Dysarthria noted. L facial droop with significant drooling apprecated. Intermittent wet vocal quality  with throat clearing. Cleared with RN.  Oral motor notable for L facial and lingual weakness and reduced secretion management as evidenced by significant drooling on L and inferred by intermittent wet vocal quality which was alleviated with spontaneous throat clear.   Pt given trials of thin liquids (via cup sip), nectar-thick liquids (via cup sip) and puree. Pt with s/sx at least a mild oral dysphagia  c/b trace-mild pocketing/oral residual on L with puree. Pt with immediate cough response with both thin liquids and nectar-thick liquids (via cup sip) which is concerning for highly suspected pharyngeal dysphagia. Further PO trials deferred at this time given severity of pt's s/sx.   At present, a safe oral diet cannot be recommended. MBSS tenatively scheduled for 3/9 @ 1200 to instrumentally evaluate pt's oropharyngeal swallow function in setting of stroke. Recommend NPO in the interim.   Pt educated at length re: results of assessment, recommendations, rationale for MBSS, MBSS procedure, and SLP POC. Pt verbalized understanding/agreement.   RN and MD made aware of results, recommendations, and SLP POC.     SLP Visit Diagnosis: Dysphagia, oropharyngeal phase (R13.12)    Aspiration Risk  Moderate aspiration risk    Diet Recommendation NPO        Other  Recommendations Oral Care Recommendations: Oral care QID;Patient independent with oral care (set up)    Recommendations for follow up therapy are one component of a multi-disciplinary discharge planning process, led by the attending physician.  Recommendations may be updated based on patient status, additional functional criteria and insurance authorization.  Follow up Recommendations Outpatient SLP      Assistance Recommended at Discharge Intermittent Supervision/Assistance  Functional Status Assessment Patient has had a recent decline in their functional status and demonstrates the ability to make significant improvements in function in a reasonable and predictable amount of time.  Frequency and Duration  (TBD for swallowing)          Prognosis Prognosis for Safe Diet Advancement: Fair Barriers to Reach Goals: Severity of deficits;Time post onset      Swallow Study   General Date of Onset: 07/18/21 HPI: Per Physician's H&P "Mr. George Schmidt is a 71 year old male with medical history of hypertension, history of CVA of  chronic infarct left PICA territory, CKD stage IIIa, obesity, hyperlipidemia, non-insulin-dependent diabetes mellitus, who presents emergency department for chief concerns of left-sided facial droop with left-sided weakness that started 3 days ago....   CT the head without contrast in the ED was read as acute or subacute right anterior MCA territory infarct.  Mild mass effect with 3 mm leftward midline shift.  Mild areas of relative hyperdensity within the infarct could represent petechial hemorrhage or spared brain parenchyma.  Remote inferior left cerebellar infarct. MRI of the brain without contrast: Acute infarct right MCA territory with mild petechial hemorrhage.  Mild mass effect and midline shift to the left.  Mild chronic microvascular ischemic changes in the white matter.  Small chronic infarct left PICA territory.     CTA of the head neck with and without contrast: Poor perfusion of the superior branch of the right MCA likely due to distal emboli.  This corresponds to the area of acute infarct on CT and MRI.  Mild atherosclerotic disease carotid bifurcation bilaterally without significant stenosis.  Mild stenosis cavernous carotid bilaterally.  Moderate supraclinoid internal carotid artery stenosis on the left.  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  Mild stenosis distal right  vertebral artery.     At bedside, he is able to tell me his name, age, current calendar year, and location of ER hospital.     He states that he had facial droop that started on Monday. [name], he developed slurring of speech. His wife and daughter made him come into hospital. He denies difficulty walking. He states his wife noticed, he did not eat like normal. He states in the ED, he was drinking lemonade, and he had difficulty, and was gagging.     He states he did not come to hospital on Monday because he states, 'I'm stubborn'.      He lost about 15 in one year. He has been  drinking more water instead of soda." Type of Study: Bedside Swallow Evaluation Previous Swallow Assessment: unknown Diet Prior to this Study: NPO Temperature Spikes Noted: No Respiratory Status: Room air History of Recent Intubation: No Behavior/Cognition: Alert;Cooperative;Pleasant mood (slow to respond at times) Oral Cavity Assessment: Excessive secretions Oral Care Completed by SLP: Yes Oral Cavity - Dentition: Adequate natural dentition Vision: Functional for self-feeding Self-Feeding Abilities: Able to feed self Patient Positioning: Upright in bed Baseline Vocal Quality: Normal;Wet (intermittent subtle wet vocal quality which was alleviated with spontaneous throat clearing) Volitional Cough: Strong Volitional Swallow: Able to elicit    Oral/Motor/Sensory Function Overall Oral Motor/Sensory Function: Moderate impairment Facial ROM: Reduced left Facial Symmetry: Abnormal symmetry left Facial Strength: Reduced left Lingual ROM: Reduced left Lingual Strength: Reduced Lingual Sensation: Reduced   Thin Liquid Thin Liquid: Impaired Presentation: Cup Pharyngeal  Phase Impairments: Decreased hyoid-laryngeal movement;Cough - Immediate    Nectar Thick Nectar Thick Liquid: Impaired Presentation: Cup Pharyngeal Phase Impairments: Decreased hyoid-laryngeal movement;Cough - Immediate   Puree Puree: Impaired Oral Phase Impairments: Poor awareness of bolus Oral Phase Functional Implications: Left lateral sulci pocketing;Oral residue             Cherrie Gauze, M.S., [facility] [phone] (ASCOM)   Clearnce Sorrel Demond Shallenberger 07/19/2021,10:57 AM

## 2021-07-19 NOTE — Evaluation (Signed)
Speech Language Pathology Evaluation ?Patient Details ?Name: George Schmidt ?MRN: [id number] ?DOB: 1950-09-19 ?Today's Date: 07/19/2021 ?Time: 4196-2229 ?SLP Time Calculation (min) (ACUTE ONLY): 15 min ? ?Problem List:  ?Patient Active Problem List  ? Diagnosis Date Noted  ? History of COVID-19 07/19/2021  ? CVA (cerebral vascular accident) ([place]) 07/19/2021  ? Acute right MCA stroke ([place]) 07/18/2021  ? Stage 3b chronic kidney disease (CKD) ([place]) 07/18/2021  ? Cerebellar stroke, acute ([place]) 11/30/2019  ? Cerebellar stroke ([place]) 11/30/2019  ? Hypertension   ? Diabetes mellitus without complication ([place])   ? Hyperlipidemia   ? Elevated troponin   ? Vertigo   ? Hypokalemia   ? Elevated troponin I level   ? ?Past Medical History:  ?Past Medical History:  ?Diagnosis Date  ? Arthritis   ? Basal cell carcinoma 08/25/2019  ? Left ant. shoulder latera. Superficial and nodular.   ? Basal cell carcinoma 05/25/2020  ? L upper back lateral - ED&C  ? Basal cell carcinoma 05/25/2020  ? L shoulder posterior adjacent to white patch - ED&C  ? Basal cell carcinoma 05/25/2020  ? L upper medial back - ED&C  ? Diabetes mellitus without complication ([place])   ? Hemorrhoids   ? Hyperlipidemia   ? Hypertension   ? Squamous cell carcinoma of skin 04/27/2020  ? R temple - MOHS 06/16/20 at [place]  ? ?Past Surgical History:  ?Past Surgical History:  ?Procedure Laterality Date  ? COLONOSCOPY WITH PROPOFOL N/A 10/21/2014  ? Procedure: COLONOSCOPY WITH PROPOFOL;  Surgeon: Josefine Class, MD;  Location: [facility] ENDOSCOPY;  Service: Endoscopy;  Laterality: N/A;  ? 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   CT  the head without contrast in the ED was read as acute or subacute right anterior MCA territory infarct.  Mild mass effect with 3 mm leftward midline shift.  Mild areas of relative hyperdensity within the infarct could represent petechial hemorrhage or spared brain parenchyma.  Remote inferior left cerebellar infarct. MRI of the brain without contrast: Acute infarct right MCA territory with mild petechial hemorrhage.  Mild mass effect and midline shift to the left.  Mild chronic microvascular ischemic changes in the white matter.  Small chronic infarct left PICA territory.     CTA of the head neck with and without contrast: Poor perfusion of the superior branch of the right MCA likely due to distal emboli.  This corresponds to the area of acute infarct on CT and MRI.  Mild atherosclerotic disease carotid bifurcation bilaterally without significant stenosis.  Mild stenosis cavernous carotid bilaterally.  Moderate supraclinoid internal carotid artery stenosis on the left.  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  Mild stenosis distal right vertebral artery.     At bedside, he is able to tell me his name, age, current calendar year, and location of ER hospital.     He states that he had facial droop that started on Monday. George Schmidt, he developed slurring of speech. His wife and daughter made him come into hospital. He denies difficulty walking. He states his wife noticed, he did not eat like normal. He states in the ED, he was drinking lemonade, and he  had difficulty, and was gagging.     He states he did not come to hospital on Monday because he states, 'I'm stubborn'.      He lost about 15 in one year. He has been drinking more water instead of soda."  ? ?Assessment / Plan / Recommendation ?Clinical Impression ? Pt seen for cognitive-linguistic evaluation. Pt alert, pleasant, and cooperative. Dysarthria noted. L facial droop with drooling apprecated. Intermittent  wet vocal quality with throat clearing. Cleared with RN.   ? ?Assessment completed via informal assessment and portions of Cognistat. Based on today's assessment, pt with cognitive-linguistic deficits affecting attention (sustained, L visual) and pragmatics (pt with flat affect, reduced eye contact). Pt with delayed verbal responses to questions at times and benefited from extra time for processing. Pt also benefited from repetition of stimuli due to reduced sustained attention. Otherwise, pt demonstrate intact basic functional cognitive-linguistic ability. Pt with mild dysarthria c/b imprecise articulation and mildly reduced speech intelligibility. Pt benefits from cues for increased vocal loudness, slowed rate of speech, and overarticulation.  ? ?Anticipate need for post-acute SLP services for functional cognitive-linguistic and communication deficits.  ? ?SLP to f/u per POC speech therapy while pt in house. Continue SLP POC for swallowing as established this date.  ? ?Pt and RN made aware of SLP recommendations and POC as outlined above. ?   ?SLP Assessment ? SLP Recommendation/Assessment: Patient needs continued [facility] [facility] Services ?SLP Visit Diagnosis: Attention and concentration deficit;Dysarthria and anarthria (R47.1) ?Attention and concentration deficit following: Cerebral infarction  ?  ?Recommendations for follow up therapy are one component of a multi-disciplinary discharge planning process, led by the attending physician.  Recommendations may be updated based on patient status, additional functional criteria and insurance authorization. ?   ?Follow Up Recommendations ? Outpatient SLP  ?  ?Assistance Recommended at Discharge ? Intermittent Supervision/Assistance  ?Functional Status Assessment Patient has had a recent decline in their functional status and demonstrates the ability to make significant improvements in function in a reasonable and predictable amount of time.  ?Frequency and  Duration min 2x/week  ?2 weeks ?  ?   ?SLP Evaluation ?Cognition ? Overall Cognitive Status: Impaired/Different from baseline ?Arousal/Alertness: Awake/alert ?Orientation Level: Oriented X4 ?Attention: Focused (visual attention left upper quadrant) ?Focused Attention: Impaired (redirection needed at times to attend to tasks; pt benefited from repetition of stimuli) ?Memory: Appears intact ?Awareness: Impaired ?Problem Solving: Appears intact (verbal) ?Executive Function: Reasoning;Sequencing ?Reasoning: Appears intact (extra time) ?Sequencing: Appears intact (extra time) ?Behaviors:  (flat affect; slowed responses)  ?  ?   ?Comprehension ? Auditory Comprehension ?Overall Auditory Comprehension: Appears within functional limits for tasks assessed (with extra time)  ?  ?Expression Expression ?Primary Mode of Expression: Verbal ?Verbal Expression ?Overall Verbal Expression: Impaired ?Initiation: Impaired (slowed responses at times) ?Automatic Speech:  [facility]) ?Pragmatics: Impairment ?Impairments: Abnormal affect;Eye contact ?Interfering Components: Attention ?Written Expression ?Dominant Hand: Right ?Written Expression:  (DNT)   ?Oral / Motor ? Oral Motor/Sensory Function ?Overall Oral Motor/Sensory Function: Moderate impairment ?Facial ROM: Reduced left ?Facial Symmetry: Abnormal symmetry left ?Facial Strength: Reduced left ?Lingual ROM: Reduced left ?Lingual Strength: Reduced ?Lingual Sensation: Reduced ?Motor Speech ?Overall Motor Speech: Impaired ?Respiration: Within functional limits ?Phonation: Normal;Wet ?Resonance: Within functional limits ?Articulation: Impaired ?Intelligibility: Intelligibility reduced ?Conversation: 75-100% accurate ?Motor Planning: Witnin functional limits ?Effective Techniques: Slow rate;Increased vocal intensity;Over-articulate   ?        ?Cherrie Gauze, M.S., CCC-SLP ?Speech-Language Pathologist ?[facility] ?[phone] (  ASCOM)  ? ?Clearnce Sorrel  Lachele Lievanos ?07/19/2021, 11:11 AM ? ?

## 2021-07-19 NOTE — Progress Notes (Signed)
Speech Language Pathology Treatment:    ?Patient Details ?Name: George Schmidt ?MRN: [id number] ?DOB: August 16, 1950 ?Today's Date: 07/19/2021 ?Time: 0263-7858 ?SLP Time Calculation (min) (ACUTE ONLY): 15 min ? ?Assessment / Plan / Recommendation ?Clinical Impression ? Follow up education completed. MBSS completed this date. Per MBSS, pt presents with a mild oropharyngeal dysphagia. See MBSS report for additional details.  ? ?Recommend initiation of a mech soft diet with thin liquids with safe swallowing strategies/aspiration precautions as outlined below - including avoidance of straws and checking mouth for oral clearance following POs. ? ?Reviewed results of MBSS, diet recommendations, safe swallowing strategies/aspiration precautions, rationale for diet recommendations/safe swallowing precautions/aspiration precautions, risks associated with aspiration/aspiration PNA, changes to swallowing following stroke, and SLP POC with pt. Pt asked pertinent questions particularly re: diet modifications and verbalized satisfaction with answers provided by SLP. Pt verbalized understanding/agreement with education provided.  ? ?RN and MD made aware of results of assessment, diet recommendations, safe swallowing strategies/aspiration precautions, and SLP POC.  ? ?SLP to f/u per POC for diet tolerance, trials of upgraded textures, and further pt/family education. Continue SLP POC for speech and cognitive-linguistic tx as outlined in cognitive-linguistic evaluation completed this date.  ? ?  ?HPI HPI: Per 43 H&P "Mr. 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   CT the head without contrast in the ED was read as acute or  subacute right anterior MCA territory infarct.  Mild mass effect with 3 mm leftward midline shift.  Mild areas of relative hyperdensity within the infarct could represent petechial hemorrhage or spared brain parenchyma.  Remote inferior left cerebellar infarct. MRI of the brain without contrast: Acute infarct right MCA territory with mild petechial hemorrhage.  Mild mass effect and midline shift to the left.  Mild chronic microvascular ischemic changes in the white matter.  Small chronic infarct left PICA territory.     CTA of the head neck with and without contrast: Poor perfusion of the superior branch of the right MCA likely due to distal emboli.  This corresponds to the area of acute infarct on CT and MRI.  Mild atherosclerotic disease carotid bifurcation bilaterally without significant stenosis.  Mild stenosis cavernous carotid bilaterally.  Moderate supraclinoid internal carotid artery stenosis on the left.  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  Mild stenosis distal right vertebral artery.     At bedside, he is able to tell me his name, age, current calendar year, and location of ER hospital.     He states that he had facial droop that started on Monday. George Schmidt, he developed slurring of speech. His wife and daughter made him come into hospital. He denies difficulty walking. He states his wife noticed, he did not eat like normal. He states in the ED, he was drinking lemonade, and he had difficulty, and was gagging.     He states he did not come to hospital on Monday because he states, 'I'm stubborn'.      He lost about 15 in one year. He has been drinking more water instead of soda." ?  ?   ?SLP Plan ? Continue with current plan of care ? ?Patient needs continued Speech Lanaguage Pathology Services ?  ?Recommendations for follow up  therapy are one component of a multi-disciplinary discharge planning process, led by the attending physician.   Recommendations may be updated based on patient status, additional functional criteria and insurance authorization. ?  ? ?Recommendations  ?Diet recommendations: Dysphagia 3 (mechanical soft);Dysphagia 1 (puree) ?Liquids provided via: Teaspoon;Cup;No straw ?Medication Administration: Crushed with puree (vs whole in puree) ?Supervision: Patient able to self feed;Intermittent supervision to cue for compensatory strategies (set up assistance) ?Compensations: Minimize environmental distractions;Slow rate;Small sips/bites;Follow solids with liquid (check mouth for oral clearance after POs; NO STRAWS!!) ?Postural Changes and/or Swallow Maneuvers: Out of bed for meals;Seated upright 90 degrees;Upright 30-60 min after meal  ?   ?    ?   ? ? ? ? Oral Care Recommendations: Oral care QID;Patient independent with oral care ?Follow Up Recommendations: Outpatient SLP ?Assistance recommended at discharge: Intermittent Supervision/Assistance ?SLP Visit Diagnosis: Dysphagia, oropharyngeal phase (R13.12) ?Plan: Continue with current plan of care ? ? ? ? ?  ?  ? ?Cherrie Gauze, M.S., CCC-SLP ?Speech-Language Pathologist ?[facility] ?[phone] ([place])   ? ?[name] ? ?07/19/2021, 1:59 PM ?

## 2021-07-19 NOTE — Progress Notes (Signed)
Lexine Baton Contractor re PIV consult.  RN's state that the PIV is not needed.  No IV medications ordered at this time. ?

## 2021-07-19 NOTE — Assessment & Plan Note (Addendum)
-   He had covid in late December of 2022 ?

## 2021-07-19 NOTE — Procedures (Signed)
Objective Swallowing Evaluation: Type of Study: MBS-Modified Barium Swallow Study   Patient Details  Name: George Schmidt MRN: [id number] Date of Birth: [birth year]  Today's Date: 07/19/2021 Time: SLP Start Time (ACUTE ONLY): 1200 -SLP Stop Time (ACUTE ONLY): 1220  SLP Time Calculation (min) (ACUTE ONLY): 20 min   Past Medical History:  Past Medical History:  Diagnosis Date   Arthritis    Basal cell carcinoma 08/25/2019   Left ant. shoulder latera. Superficial and nodular.    Basal cell carcinoma 05/25/2020   L upper back lateral - ED&C   Basal cell carcinoma 05/25/2020   L shoulder posterior adjacent to white patch - ED&C   Basal cell carcinoma 05/25/2020   L upper medial back - ED&C   Diabetes mellitus without complication ([place])    Hemorrhoids    Hyperlipidemia    Hypertension    Squamous cell carcinoma of skin 04/27/2020   R temple - MOHS 06/16/20 at [place]   Past Surgical History:  Past Surgical History:  Procedure Laterality Date   COLONOSCOPY WITH PROPOFOL N/A 10/21/2014   Procedure: COLONOSCOPY WITH PROPOFOL;  Surgeon: Josefine Class, MD;  Location: [facility] ENDOSCOPY;  Service: Endoscopy;  Laterality: N/A;   HPI: Per GGEZMOQHU'T H&P "Mr. 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   CT the head without contrast in the ED was read as acute or subacute right anterior MCA territory infarct.  Mild mass effect with 3 mm leftward midline shift.  Mild areas of relative hyperdensity within the infarct could represent petechial hemorrhage or spared brain parenchyma.  Remote inferior left cerebellar infarct. MRI of the brain without contrast: Acute infarct right MCA  territory with mild petechial hemorrhage.  Mild mass effect and midline shift to the left.  Mild chronic microvascular ischemic changes in the white matter.  Small chronic infarct left PICA territory.     CTA of the head neck with and without contrast: Poor perfusion of the superior branch of the right MCA likely due to distal emboli.  This corresponds to the area of acute infarct on CT and MRI.  Mild atherosclerotic disease carotid bifurcation bilaterally without significant stenosis.  Mild stenosis cavernous carotid bilaterally.  Moderate supraclinoid internal carotid artery stenosis on the left.  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  Mild stenosis distal right vertebral artery.     At bedside, he is able to tell me his name, age, current calendar year, and location of ER hospital.     He states that he had facial droop that started on Monday. [name], he developed slurring of speech. His wife and daughter made him come into hospital. He denies difficulty walking. He states his wife noticed, he did not eat like normal. He states in the ED, he was drinking lemonade, and he had difficulty, and was gagging.     He states he did not come to hospital on Monday because he states, 'I'm stubborn'.      He lost about 15 in one year. He has been drinking more water instead of soda."   Subjective: Pt alert, pleasant, and cooperative. Eager to complete MBSS.    Recommendations for follow up therapy are one component of a multi-disciplinary  discharge planning process, led by the attending physician.  Recommendations may be updated based on patient status, additional functional criteria and insurance authorization.  Assessment / Plan / Recommendation  Clinical Impressions 07/19/2021  Clinical Impression Pt seen for MBSS. Upon SLP entrance to fluoro suite, Pt alert, pleasant, and cooperative and eager to complete MBSS. Slow to respond at times.   Pt presents with  a mild oropharyngeal dysphagia. Oral deficits c/b decreased posterior lingual control resulting in pre-swallow spillage to the level of the vallecula or pyriform sinuses, reduced labial seal/competence resulting in anterior loss of thin liquids via cup sip on L, and reduced lingual strength/coordination resulting in trace lingual residual with solids (regular and pureed) and mildly prolonged mastication of solids. Pharyngeal deficits c/b suspected reduced pharyngeal sensation resulting delayed swallow initiation with pre-swallow pooling in the vallecula or pyriform sinuses, and reduced/mistimed laryngeal vestibule closure and reduced laryngeal sensation resulting in before the swallow silent aspiration of thin liquids via straw sip. Aspiration appeared to be transient; however, moments immediately after aspiration were not captured under fluoroscopy. Of note, transient laryngeal penetration was observed with sequential cup sips of thin liquids which is [facility] for age.   Recommend initiation of a mech soft diet with thin liquids with safe swallowing strategies/aspiration precautions as outlined below - including avoidance of straws.   Pt shown videofluoscopic images following MBSS for education. Reinforcement of content likely needed. Pt appeared distracted in fluoro suite.   SLP to f/u per POC for diet tolerance, trials of upgraded textures, and further pt/family education.    SLP Visit Diagnosis Dysphagia, oropharyngeal phase (R13.12)  Attention and concentration deficit following --  Frontal lobe and executive function deficit following --  Impact on safety and function Mild aspiration risk      Treatment Recommendations 07/19/2021  Treatment Recommendations Therapy as outlined in treatment plan below     Prognosis 07/19/2021  Prognosis for Safe Diet Advancement Fair  Barriers to Reach Goals Severity of deficits;Time post onset  Barriers/Prognosis Comment --    Diet Recommendations 07/19/2021  SLP  Diet Recommendations Dysphagia 3 (Mech soft) solids;Thin liquid  Liquid Administration via No straw;Cup;Spoon  Medication Administration Crushed with puree  Compensations Minimize environmental distractions;Slow rate;Small sips/bites;Follow solids with liquid  Postural Changes Seated upright at 90 degrees;Remain semi-upright after after feeds/meals (Comment)      Other Recommendations 07/19/2021  Recommended Consults --  Oral Care Recommendations Oral care QID  Other Recommendations --  Follow Up Recommendations Outpatient SLP  Assistance recommended at discharge Intermittent Supervision/Assistance  Functional Status Assessment Patient has had a recent decline in their functional status and/or demonstrates limited ability to make significant improvements in function in a reasonable and predictable amount of time    Frequency and Duration  07/19/2021  Speech Therapy Frequency (ACUTE ONLY) min 2x/week  Treatment Duration 2 weeks      Oral Phase 07/19/2021  Oral Phase Impaired  Oral - Pudding Teaspoon NT  Oral - Pudding Cup NT  Oral - Honey Teaspoon --  Oral - Honey Cup --  Oral - Nectar Teaspoon --  Oral - Nectar Cup --  Oral - Nectar Straw --  Oral - Thin Teaspoon Premature spillage  Oral - Thin Cup Left anterior bolus loss;Premature spillage  Oral - Thin Straw Premature spillage  Oral - Puree Premature spillage;Lingual/palatal residue  Oral - Mech Soft NT  Oral - Regular Premature spillage;Lingual/palatal residue  Oral - Multi-Consistency NT  Oral - Pill NT  Oral Phase - Comment --  Pharyngeal Phase 07/19/2021  Pharyngeal Phase Impaired  Pharyngeal- Pudding Teaspoon NT  Pharyngeal --  Pharyngeal- Pudding Cup NT  Pharyngeal --  Pharyngeal- Honey Teaspoon --  Pharyngeal --  Pharyngeal- Honey Cup --  Pharyngeal --  Pharyngeal- Nectar Teaspoon --  Pharyngeal --  Pharyngeal- Nectar Cup --  Pharyngeal --  Pharyngeal- Nectar Straw --  Pharyngeal --  Pharyngeal- Thin  Teaspoon Delayed swallow initiation-pyriform sinuses  Pharyngeal Material does not enter airway  Pharyngeal- Thin Cup Delayed swallow initiation-pyriform sinuses  Pharyngeal Material does not enter airway;Material enters airway, remains ABOVE vocal cords then ejected out  Pharyngeal- Thin Straw Delayed swallow initiation-pyriform sinuses;Reduced airway/laryngeal closure;Penetration/Aspiration before swallow;Trace aspiration  Pharyngeal Material enters airway, passes BELOW cords then ejected out  Pharyngeal- Puree Delayed swallow initiation-pyriform sinuses;Delayed swallow initiation-vallecula  Pharyngeal Material does not enter airway  Pharyngeal- Mechanical Soft --  Pharyngeal --  Pharyngeal- Regular Delayed swallow initiation-pyriform sinuses  Pharyngeal Material does not enter airway  Pharyngeal- Multi-consistency NT  Pharyngeal --  Pharyngeal- Pill NT  Pharyngeal --  Pharyngeal Comment --     Cervical Esophageal Phase  07/19/2021  Cervical Esophageal Phase WFL  Pudding Teaspoon --  Pudding Cup --  Honey Teaspoon --  Honey Cup --  Nectar Teaspoon --  Nectar Cup --  Nectar Straw --  Thin Teaspoon --  Thin Cup --  Thin Straw --  Puree --  Mechanical Soft --  Regular --  Multi-consistency --  Pill --  Cervical Esophageal Comment --    Cherrie Gauze, M.S., [facility] [phone] ([place])  [name] 07/19/2021, 1:26 PM

## 2021-07-19 NOTE — Progress Notes (Signed)
Admission profile updated. ?

## 2021-07-19 NOTE — Progress Notes (Signed)
[place] [facility] [place] NAME: Ociel Retherford    MR#:  [id number]  DATE OF BIRTH:  Oct 22, 1950  SUBJECTIVE:   patient came in with facial droop and expressive aphasia noticed by wife at home. Had been going on for three days. Had some trouble swallowing. No focal weakness. Ambulates without walker/cane at home.    VITALS:  Blood pressure (!) 150/71, pulse (!) 48, temperature 98.1 F (36.7 C), temperature source Oral, resp. rate 11, height '5\' 9"'$  (1.753 m), weight 106.1 kg, SpO2 99 %.  PHYSICAL EXAMINATION:   GENERAL:  71 y.o.-year-old patient lying in the bed with no acute distress.  LUNGS: Normal breath sounds bilaterally, no wheezing, rales, rhonchi.  CARDIOVASCULAR: S1, S2 normal. No murmurs, rubs, or gallops.  ABDOMEN: Soft, nontender, nondistended. Bowel sounds present.  EXTREMITIES: No  edema b/l.    NEUROLOGIC: nonfocal  patient is alert and awake, mild dysarthria. SKIN: No obvious rash, lesion, or ulcer.   LABORATORY PANEL:  CBC Recent Labs  Lab 07/18/21 1432  WBC 9.7  HGB 14.2  HCT 40.8  PLT 273    Chemistries  Recent Labs  Lab 07/18/21 1432  NA 137  K 3.6  CL 103  CO2 21*  GLUCOSE 161*  BUN 30*  CREATININE 1.67*  CALCIUM 9.5  AST 29  ALT 14  ALKPHOS 33*  BILITOT 1.0   Cardiac Enzymes No results for input(s): TROPONINI in the last 168 hours. RADIOLOGY:  CT ANGIO HEAD NECK W WO CM  Result Date: 07/18/2021 CLINICAL DATA:  Stroke.  Left-sided weakness.  Speech abnormality EXAM: CT ANGIOGRAPHY HEAD AND NECK TECHNIQUE: Multidetector CT imaging of the head and neck was performed using the standard protocol during bolus administration of intravenous contrast. Multiplanar CT image reconstructions and MIPs were obtained to evaluate the vascular anatomy. Carotid stenosis measurements (when applicable) are obtained utilizing NASCET criteria, using the distal internal carotid diameter as the denominator. RADIATION DOSE  REDUCTION: This exam was performed according to the departmental dose-optimization program which includes automated exposure control, adjustment of the mA and/or kV according to patient size and/or use of iterative reconstruction technique. CONTRAST:  75m OMNIPAQUE IOHEXOL 350 MG/ML SOLN COMPARISON:  CT head and MRI head 07/18/2021. CT angio head neck 11/30/2019 FINDINGS: CTA NECK FINDINGS Aortic arch: Standard branching. Imaged portion shows no evidence of aneurysm or dissection. No significant stenosis of the major arch vessel origins. Mild atherosclerotic calcification aortic arch and proximal great vessels Right carotid system: Mild atherosclerotic disease right carotid bifurcation without significant stenosis. No plaque ulceration or thrombus. Left carotid system: Mild atherosclerotic disease left carotid bifurcation. Negative for stenosis. No plaque ulceration or thrombus. Vertebral arteries: Right vertebral artery is patent to the basilar without significant stenosis Left vertebral artery has decreased enhancement proximally and then occludes at C2. Left vertebral artery remains occluded and does not contribute to the basilar. 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  No acute skeletal abnormality. Other neck: Negative for mass or adenopathy in the neck. Upper chest: Lung apices clear bilaterally. Review of the MIP images confirms the above findings CTA HEAD FINDINGS Anterior circulation: Atherosclerotic calcification in the cavernous carotid bilaterally with mild stenosis bilaterally moderate stenosis left supraclinoid internal carotid artery. Right M1 widely patent. Right middle cerebral artery bifurcation patent. There is decreased opacification of the superior division of the right MCA with probable small branch occlusions. This correlates with the right MCA  infarct on MRI and CT. Inferior division right MCA widely patent. Anterior  cerebral arteries patent bilaterally without stenosis. Left MCA patent without stenosis. Posterior circulation: Right vertebral artery supplies the basilar. Mild stenosis distally. Right PICA patent. Distal left vertebral artery is occluded. Left PICA not visualized. Basilar widely patent. Superior cerebellar and posterior cerebral arteries patent bilaterally. Venous sinuses: Limited venous enhancement due to arterial phase scanning. Anatomic variants: None Review of the MIP images confirms the above findings IMPRESSION: 1. Poor perfusion of the superior branch of the right MCA likely due to distal emboli. This corresponds to the area of acute infarct on CT and MRI. 2. Mild atherosclerotic disease carotid bifurcation bilaterally without significant stenosis. Mild stenosis cavernous carotid bilaterally. Moderate supraclinoid internal carotid artery stenosis on the left 3. 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 Mild stenosis distal right vertebral artery. 4. These results were called by telephone at the time of interpretation on 07/18/2021 at 5:42 pm to provider [facility] , who verbally acknowledged these results. Electronically Signed   By: Franchot Gallo M.D.   On: 07/18/2021 17:44   CT HEAD WO CONTRAST  Result Date: 07/18/2021 CLINICAL DATA:  Stroke, follow up EXAM: CT HEAD WITHOUT CONTRAST TECHNIQUE: Contiguous axial images were obtained from the base of the skull through the vertex without intravenous contrast. RADIATION DOSE REDUCTION: This exam was performed according to the departmental dose-optimization program which includes automated exposure control, adjustment of the mA and/or kV according to patient size and/or use of iterative reconstruction technique. COMPARISON:  11/30/2019. FINDINGS: Brain: Hypoattenuation loss of gray-white differentiation in the right insula and overlying frontal lobe, compatible with acute or subacute right anterior  MCA distribution infarct. Mild areas of relative hyperdensity within the infarct could represent petechial hemorrhage or spared brain parenchyma. Mild mass effect with 3 mm of leftward midline shift at the foramen of [place]. Remote inferior left cerebellar infarct. Vascular: No hyperdense vessel identified. Calcific intracranial atherosclerosis Skull: No acute fracture. Sinuses/Orbits: Clear sinuses.  Unremarkable orbits. Other: No mastoid effusions IMPRESSION: 1. Acute or subacute right anterior MCA territory infarct. Mild mass effect with 3 mm of leftward midline shift. Mild areas of relative hyperdensity within the infarct could represent petechial hemorrhage or spared brain parenchyma. MRI could further characterize. 2. Remote inferior left cerebellar infarct. Electronically Signed   By: Margaretha Sheffield M.D.   On: 07/18/2021 15:18   MR BRAIN WO CONTRAST  Addendum Date: 07/18/2021   ADDENDUM REPORT: 07/18/2021 17:14 ADDENDUM: These results were called by telephone at the time of interpretation on 07/18/2021 at 5:13 pm to provider [facility] , who verbally acknowledged these results. Electronically Signed   By: Franchot Gallo M.D.   On: 07/18/2021 17:14   Result Date: 07/18/2021 CLINICAL DATA:  Acute neuro deficit.  Stroke.  Left-sided weakness. EXAM: MRI HEAD WITHOUT CONTRAST TECHNIQUE: Multiplanar, multiecho pulse sequences of the brain and surrounding structures were obtained without intravenous contrast. COMPARISON:  CT head 07/18/2021 FINDINGS: Brain: Acute infarct right MCA territory. Infarct involves the operculum and right frontal lobe. Mild associated petechial hemorrhage in the infarct. Sparing of the basal ganglia and posterior division of the right MCA. Ventricle size normal.  Slight midline shift to the left. Chronic infarct left inferior cerebellum. Mild chronic microvascular ischemic change in the white matter. No mass lesion identified. Vascular: Normal arterial flow voids. Skull and  upper cervical spine: Negative Sinuses/Orbits: Paranasal sinuses clear.  Negative orbit Other: None IMPRESSION:  Acute infarct right MCA territory with mild petechial hemorrhage. Mild mass-effect and midline shift to the left. Mild chronic microvascular ischemic change in the white matter. Small chronic infarct left PICA territory. Electronically Signed: By: Franchot Gallo M.D. 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  MRI of the brain without contrast: Acute infarct right MCA territory with mild petechial hemorrhage.  Mild mass effect and midline shift to the left.  Mild chronic microvascular ischemic changes in the white matter.  Small chronic infarct left PICA territory.  CTA of the head neck with and without contrast: Poor perfusion of the superior branch of the right MCA likely due to distal emboli.  This corresponds to the area of acute infarct on CT and MRI.  Mild atherosclerotic disease carotid bifurcation bilaterally without significant stenosis.  Mild stenosis cavernous carotid bilaterally.  Moderate supraclinoid internal carotid artery stenosis on the left.  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  Mild stenosis distal right vertebral artery.   Acute CVA-- right MCA territory with mild petechial hemorrhage -- neurology consultation with Dr. [name]-- recommends and aspirin and continue Plavix -- echo completed results pending -- PT OT to see patient -- speech therapy recommended modified barium-- patient has slight risk of aspiration. Continue dysphagia three no straw diet -- continue  statins  Hypertension -- resume BP meds  stage IIIB chronic kidney disease -- creatinine stable  Hyperlipidemia -- continue statins   Procedures: none Family communication : wife [name] on the phone Consults : neurology CODE STATUS: full DVT Prophylaxis : SCD Level of care: Telemetry Medical Status is: Inpatient Remains inpatient appropriate because: stroke w/u    TOTAL TIME TAKING CARE OF THIS PATIENT: 25 minutes.  >50% time spent on counselling and coordination of care  Note: This dictation was prepared with Dragon dictation along with smaller phrase technology. Any transcriptional errors that result from this process are unintentional.  [name] M.D    [facility]   CC: Primary care physician; [name], MD

## 2021-07-19 NOTE — Progress Notes (Signed)
*  PRELIMINARY RESULTS* ?Echocardiogram ?2D Echocardiogram has been performed. ? ?Jermane Brayboy, [place] Side ?07/19/2021, 10:57 AM ?

## 2021-07-20 ENCOUNTER — Inpatient Hospital Stay: Payer: Medicare Other

## 2021-07-20 ENCOUNTER — Other Ambulatory Visit: Payer: Self-pay

## 2021-07-20 IMAGING — CT CT HEAD W/O CM
4 series · 16 of 47 positions shown, 18 images · non-contrast
Comparison: CT head dated [DATE]

CLINICAL DATA: Stroke follow-up



[Series 2: head bone · axial · 0.42mm/px · z∈[+532,+564]mm · 3 of 82 slices shown]
[im 9/82  bone]
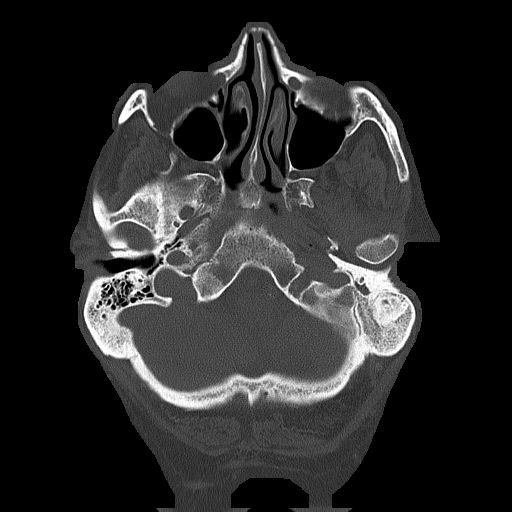
[im 17/82  bone]
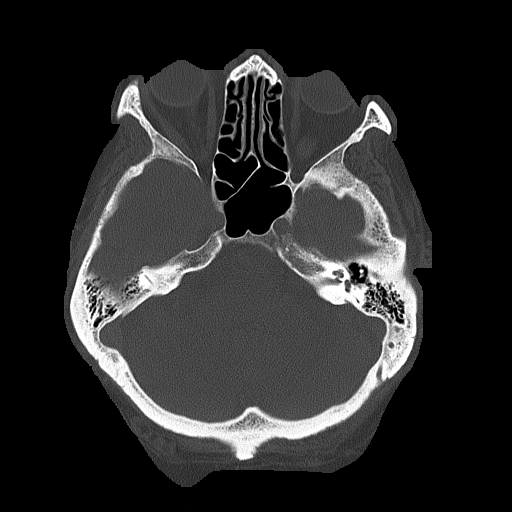
[im 25/82  bone]
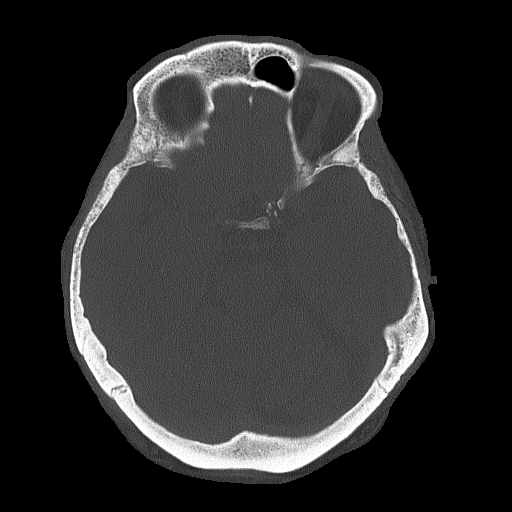

[Series 3: head wo · axial · 0.42mm/px · z∈[+536,+656]mm · 7 of 33 slices shown, 9 images]
[im 5/33  brain]
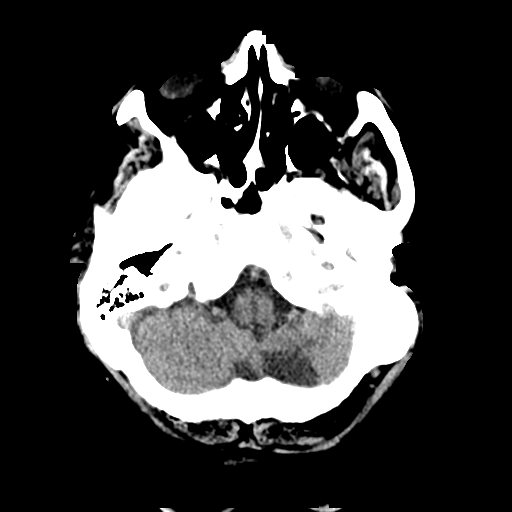
[im 5/33  bone]
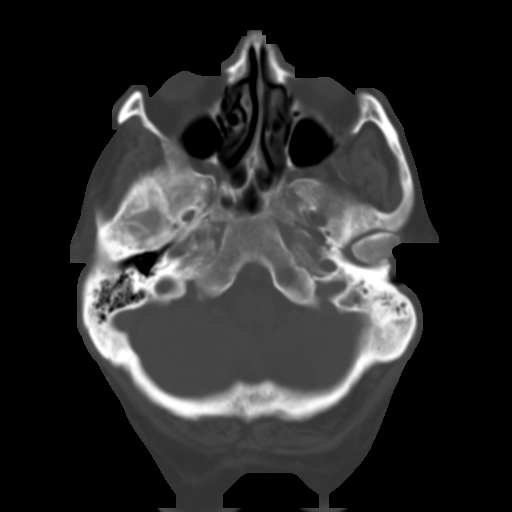
[im 9/33  brain]
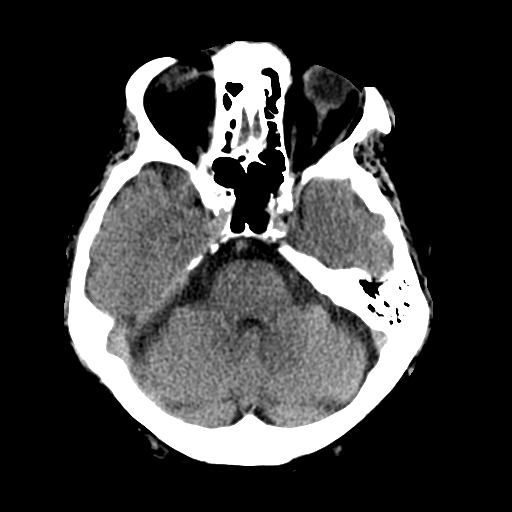
[im 13/33  brain]
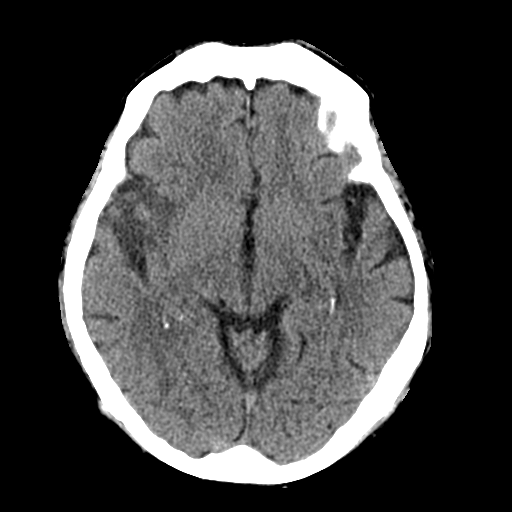
[im 17/33  brain]
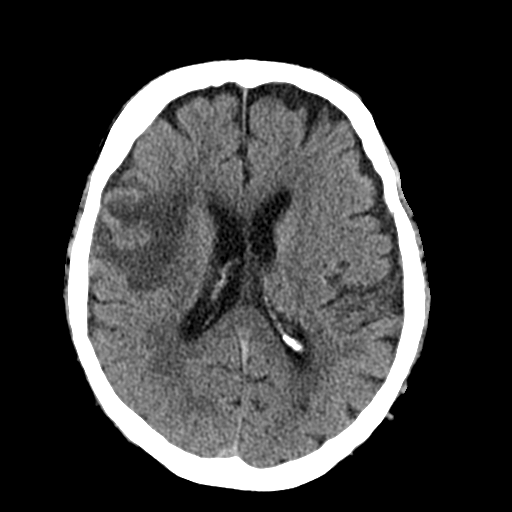
[im 21/33  brain]
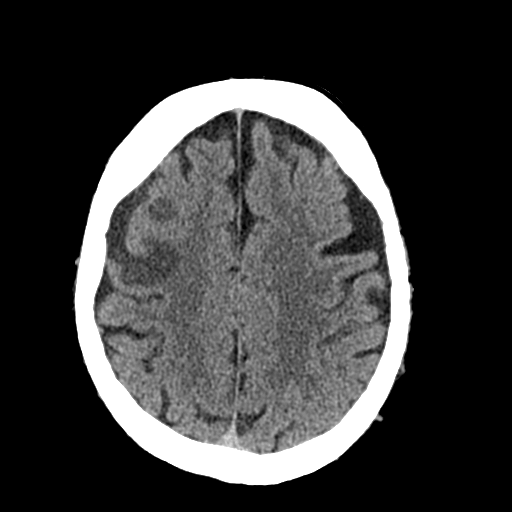
[im 21/33  bone]
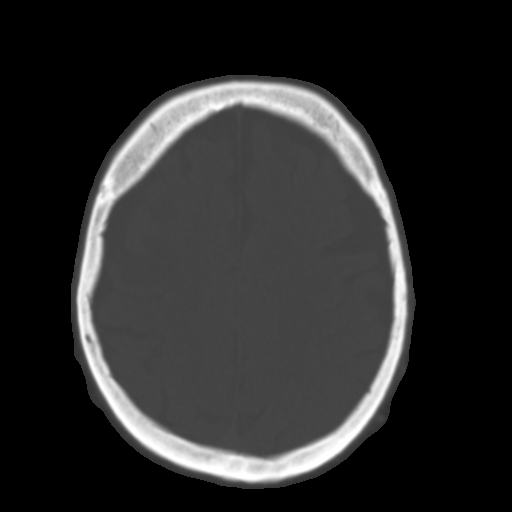
[im 25/33  brain]
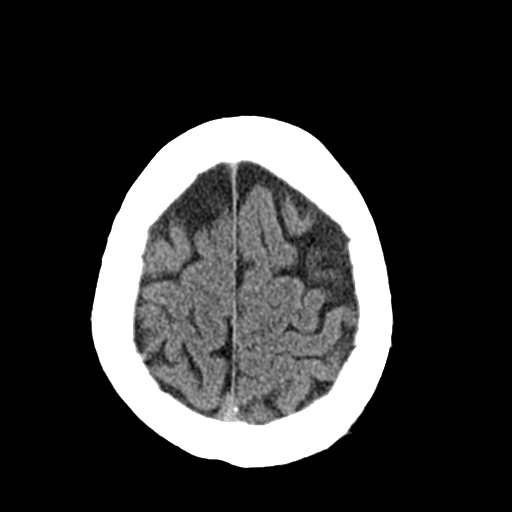
[im 29/33  brain]
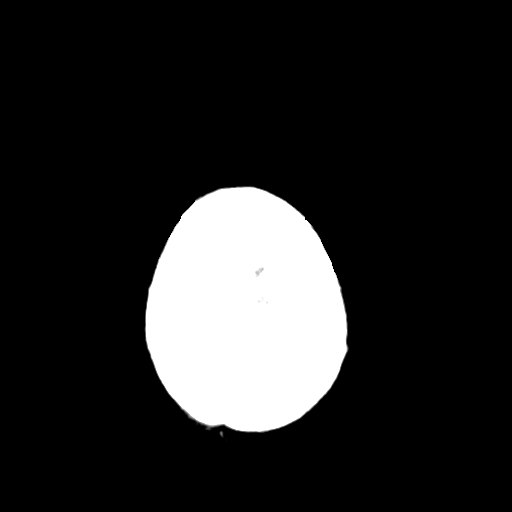

[Series 4: coronal soft tissue · coronal · 0.31mm/px · 3 of 71 slices shown]
[im 24/71  brain]
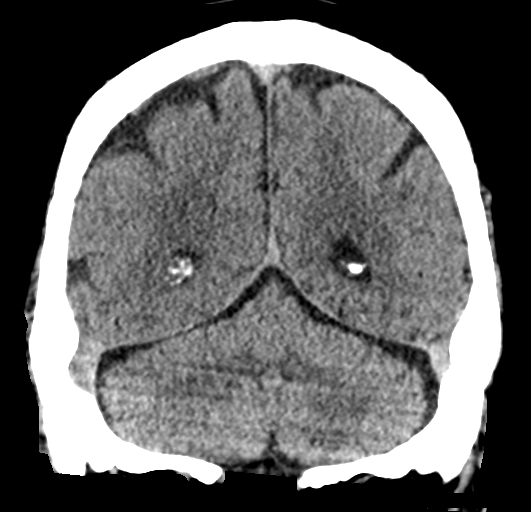
[im 32/71  brain]
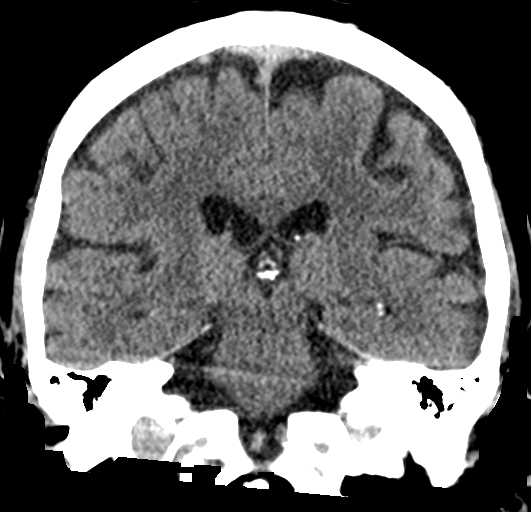
[im 39/71  brain]
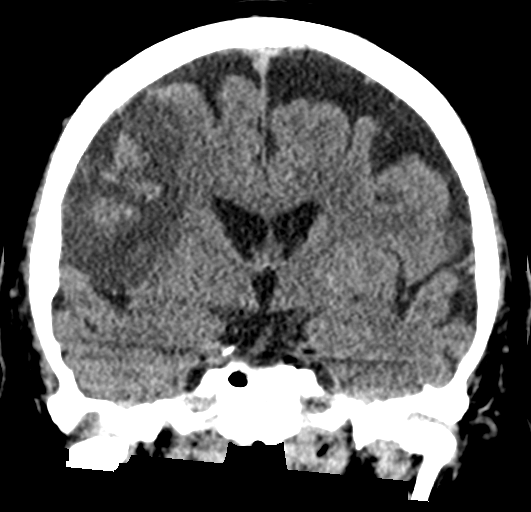

[Series 5: sagittal soft tissue · sagittal · 0.31mm/px · 3 of 60 slices shown]
[im 20/60  brain]
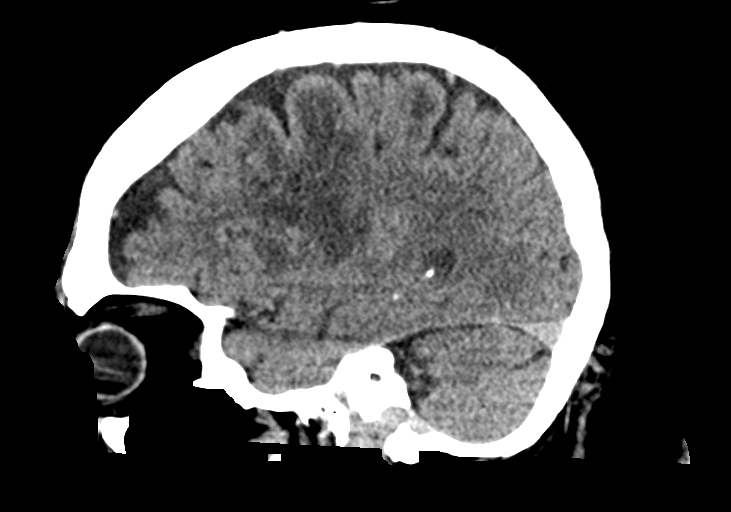
[im 30/60  brain]
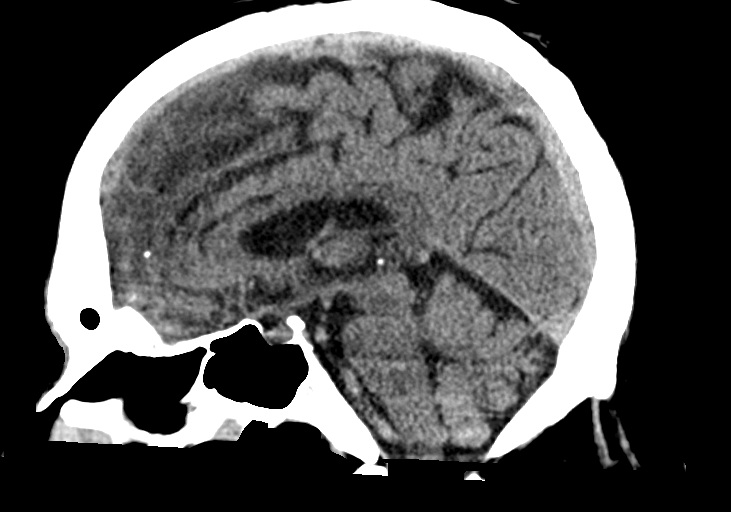
[im 40/60  brain]
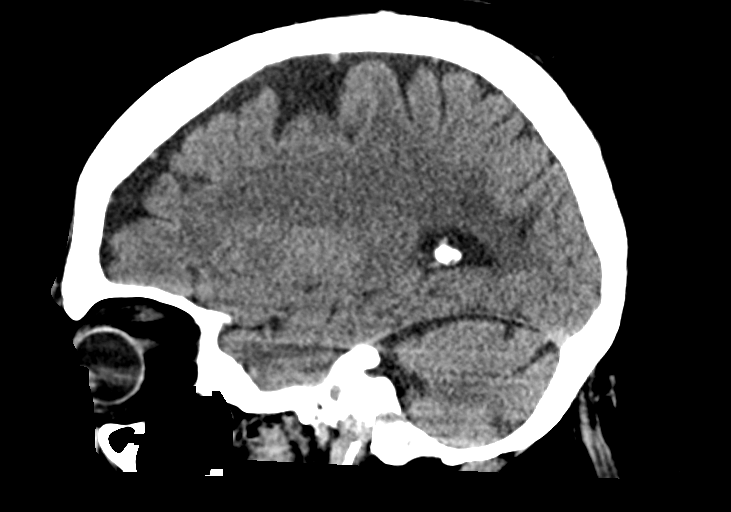

[16 of 47 positions shown; findings below may reference images not displayed]

FINDINGS: Brain: Right insular region subacute infarct is more conspicuous.
Mild surrounding vasogenic edema with approximately 2 mm midline
shift. No evidence of parenchymal hemorrhage. Chronic left inferior
cerebellar infarct is unchanged. No other significant interval
change.

Vascular: No hyperdense vessel or unexpected calcification.

Skull: Normal. Negative for fracture or focal lesion.

Sinuses/Orbits: No acute finding.

Other: None.
IMPRESSION: 1. No significant interval change in subacute infarction of the
right MCA territory with surrounding edema and approximately 2 mm
right-to-left midline shift.

2.  Chronic left inferior cerebellar infarct, unchanged.

## 2021-07-20 MED ORDER — APIXABAN 5 MG PO TABS
5.0000 mg | ORAL_TABLET | Freq: Two times a day (BID) | ORAL | Status: DC
  Administered 2021-07-20: 5 mg via ORAL
  Filled 2021-07-20: qty 1

## 2021-07-20 MED ORDER — ATENOLOL 25 MG PO TABS
25.0000 mg | ORAL_TABLET | Freq: Every day | ORAL | Status: DC
  Filled 2021-07-20: qty 1

## 2021-07-20 MED ORDER — APIXABAN 5 MG PO TABS: 5.0000 mg | ORAL_TABLET | Freq: Two times a day (BID) | ORAL | 1 refills | Status: AC

## 2021-07-20 NOTE — Discharge Summary (Signed)
Physician Discharge Summary   Patient: George Schmidt MRN: [id number] DOB: Oct 02, 1950  Admit date:     07/18/2021  Discharge date: 07/20/21  Discharge Physician: [name]   PCP: [name], MD   Recommendations at discharge:   follow-up with Dr. 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   MRI of the brain without contrast: Acute infarct right MCA territory with mild petechial hemorrhage.  Mild mass effect and midline shift to the left.  Mild chronic microvascular ischemic changes in the white matter.  Small chronic infarct left PICA territory.   CTA of the head neck with and without contrast: Poor perfusion of the superior branch of the right MCA likely due to distal emboli.  This corresponds to the area of acute infarct on CT and MRI.  Mild atherosclerotic disease carotid bifurcation bilaterally without significant stenosis.  Mild stenosis cavernous carotid bilaterally.  Moderate supraclinoid internal carotid artery stenosis on the left.  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  Mild stenosis distal right vertebral artery.   Acute CVA-- right MCA territory with mild petechial hemorrhage -- neurology consultation with Dr. [name]-- recommends and aspirin and continue Plavix --now d/ced -- echo completed  -- PT OT to see patient--No needs -- speech therapy recommended modified barium-- patient has slight risk of aspiration. Continue dysphagia 3, no straw diet -- continue  statins -- repeat CT head remains stable-- in the setting of a fib started on eliquis at discharge.  paroxysmal a fib-- new onset -- patient was noted with tachycardia on telemetry. Repeat EKG showed atrial fibrillation. -- Patient follows with Dr. Clayborn Bigness. Cardiology consultation was placed with Dr [name] -- given a fib and stroke cardiology recommends eliquis. -- Aspirin Plavix were discontinued. -- Repeat CT head remains stable per Dr. [name]. Eliquis started -- this was discussed with patient's wife [name] on the phone -- patient will follow-up with Dr. Clayborn Bigness as outpatient   Hypertension -- resume BP meds   stage IIIB chronic kidney disease -- creatinine stable   Hyperlipidemia -- continue statins        Consultants: cardiology, Neurology Procedures performed: none  Disposition: Home Diet recommendation:  Discharge Diet Orders (From admission, onward)     Start     Ordered   07/20/21 0000  Diet - low sodium heart healthy        07/20/21 1609           Cardiac and Carb modified diet DISCHARGE MEDICATION: Allergies as of 07/20/2021       Reactions   Amoxicillin Hives   Clarithromycin Hives        Medication List     STOP taking these medications    clopidogrel 75 MG tablet Commonly known as: PLAVIX   mupirocin ointment 2 % Commonly known as: BACTROBAN       TAKE these medications    amLODipine 10 MG tablet Commonly known as: NORVASC Take 10 mg by mouth daily.   apixaban 5 MG Tabs tablet Commonly known as: ELIQUIS Take 1 tablet (5 mg total)  by mouth 2 (two) times daily.   atenolol-chlorthalidone 50-25 MG tablet Commonly known as: TENORETIC Take 1 tablet by mouth daily.   atorvastatin 40 MG tablet Commonly known as: Lipitor Take 1 tablet (40 mg total) by mouth daily.   baclofen 10 MG tablet Commonly known as: LIORESAL Take 10 mg by mouth 3 (three) times daily.   fenofibrate 160 MG tablet Take 160 mg by mouth daily.    ibuprofen 200 MG tablet Commonly known as: ADVIL Take 200 mg by mouth every 6 (six) hours as needed.   ketoconazole 2 % cream Commonly known as: NIZORAL For scale apply to the ears BID.   metFORMIN 500 MG 24 hr tablet Commonly known as: GLUCOPHAGE-XR Take 2 tablets by mouth once daily   potassium chloride SA 20 MEQ tablet Commonly known as: KLOR-CON M Take 20 mEq by mouth 2 (two) times daily.   sildenafil 100 MG tablet Commonly known as: VIAGRA Take by mouth.        Follow-up Information     [name], MD. Schedule an appointment as soon as possible for a visit in 1 week(s).   Specialty: Family Medicine Why: s/p CVA Contact information: [address] [place] [address] [phone]         [name], MD. Call in 1 week(s).   Specialties: Cardiology, Internal Medicine Why: wife to call amke appt for new onset Afib Contact information: [place] [address] [phone]                Discharge Exam: George Schmidt Weights   07/18/21 1426  Weight: 106.1 kg     Condition at discharge: fair  The results of significant diagnostics from this hospitalization (including imaging, microbiology, ancillary and laboratory) are listed below for reference.   Imaging Studies: CT ANGIO HEAD NECK W WO CM  Result Date: 07/18/2021 CLINICAL DATA:  Stroke.  Left-sided weakness.  Speech abnormality EXAM: CT ANGIOGRAPHY HEAD AND NECK TECHNIQUE: Multidetector CT imaging of the head and neck was performed using the standard protocol during bolus administration of intravenous contrast. Multiplanar CT image reconstructions and MIPs were obtained to evaluate the vascular anatomy. Carotid stenosis measurements (when applicable) are obtained utilizing NASCET criteria, using the distal internal carotid diameter as the denominator. RADIATION DOSE REDUCTION: This exam was performed according to the departmental dose-optimization  program which includes automated exposure control, adjustment of the mA and/or kV according to patient size and/or use of iterative reconstruction technique. CONTRAST:  73m OMNIPAQUE IOHEXOL 350 MG/ML SOLN COMPARISON:  CT head and MRI head 07/18/2021. CT angio head neck 11/30/2019 FINDINGS: CTA NECK FINDINGS Aortic arch: Standard branching. Imaged portion shows no evidence of aneurysm or dissection. No significant stenosis of the major arch vessel origins. Mild atherosclerotic calcification aortic arch and proximal great vessels Right carotid system: Mild atherosclerotic disease right carotid bifurcation without significant stenosis. No plaque ulceration or thrombus. Left carotid system: Mild atherosclerotic disease left carotid bifurcation. Negative for stenosis. No plaque ulceration or thrombus. Vertebral arteries: Right vertebral artery is patent to the basilar without significant stenosis Left vertebral artery has decreased enhancement proximally and then occludes at C2. Left vertebral artery remains occluded and does not contribute to the basilar. 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  No acute skeletal abnormality. Other neck: Negative for mass or adenopathy in the neck. Upper chest: Lung apices clear bilaterally. Review of the MIP images confirms  the above findings CTA HEAD FINDINGS Anterior circulation: Atherosclerotic calcification in the cavernous carotid bilaterally with mild stenosis bilaterally moderate stenosis left supraclinoid internal carotid artery. Right M1 widely patent. Right middle cerebral artery bifurcation patent. There is decreased opacification of the superior division of the right MCA with probable small branch occlusions. This correlates with the right MCA infarct on MRI and CT. Inferior division right MCA widely patent. Anterior cerebral arteries patent bilaterally without stenosis. Left MCA patent without  stenosis. Posterior circulation: Right vertebral artery supplies the basilar. Mild stenosis distally. Right PICA patent. Distal left vertebral artery is occluded. Left PICA not visualized. Basilar widely patent. Superior cerebellar and posterior cerebral arteries patent bilaterally. Venous sinuses: Limited venous enhancement due to arterial phase scanning. Anatomic variants: None Review of the MIP images confirms the above findings IMPRESSION: 1. Poor perfusion of the superior branch of the right MCA likely due to distal emboli. This corresponds to the area of acute infarct on CT and MRI. 2. Mild atherosclerotic disease carotid bifurcation bilaterally without significant stenosis. Mild stenosis cavernous carotid bilaterally. Moderate supraclinoid internal carotid artery stenosis on the left 3. 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 Mild stenosis distal right vertebral artery. 4. These results were called by telephone at the time of interpretation on 07/18/2021 at 5:42 pm to provider [facility] , who verbally acknowledged these results. Electronically Signed   By: Franchot Gallo M.D.   On: 07/18/2021 17:44   CT HEAD WO CONTRAST (5MM)  Result Date: 07/20/2021 CLINICAL DATA:  Stroke follow-up EXAM: CT HEAD WITHOUT CONTRAST TECHNIQUE: Contiguous axial images were obtained from the base of the skull through the vertex without intravenous contrast. RADIATION DOSE REDUCTION: This exam was performed according to the departmental dose-optimization program which includes automated exposure control, adjustment of the mA and/or kV according to patient size and/or use of iterative reconstruction technique. COMPARISON:  CT head dated July 19, 1998 FINDINGS: Brain: Right insular region subacute infarct is more conspicuous. Mild surrounding vasogenic edema with approximately 2 mm midline shift. No evidence of parenchymal hemorrhage. Chronic left inferior cerebellar  infarct is unchanged. No other significant interval change. Vascular: No hyperdense vessel or unexpected calcification. Skull: Normal. Negative for fracture or focal lesion. Sinuses/Orbits: No acute finding. Other: None. IMPRESSION: 1. No significant interval change in subacute infarction of the right MCA territory with surrounding edema and approximately 2 mm right-to-left midline shift. 2.  Chronic left inferior cerebellar infarct, unchanged. Electronically Signed   By: Keane Police D.O.   On: 07/20/2021 15:01   CT HEAD WO CONTRAST  Result Date: 07/18/2021 CLINICAL DATA:  Stroke, follow up EXAM: CT HEAD WITHOUT CONTRAST TECHNIQUE: Contiguous axial images were obtained from the base of the skull through the vertex without intravenous contrast. RADIATION DOSE REDUCTION: This exam was performed according to the departmental dose-optimization program which includes automated exposure control, adjustment of the mA and/or kV according to patient size and/or use of iterative reconstruction technique. COMPARISON:  11/30/2019. FINDINGS: Brain: Hypoattenuation loss of gray-white differentiation in the right insula and overlying frontal lobe, compatible with acute or subacute right anterior MCA distribution infarct. Mild areas of relative hyperdensity within the infarct could represent petechial hemorrhage or spared brain parenchyma. Mild mass effect with 3 mm of leftward midline shift at the foramen of [place]. Remote inferior left cerebellar infarct. Vascular: No hyperdense vessel identified. Calcific intracranial atherosclerosis Skull: No acute fracture. Sinuses/Orbits: Clear sinuses.  Unremarkable orbits. Other: No mastoid  effusions IMPRESSION: 1. Acute or subacute right anterior MCA territory infarct. Mild mass effect with 3 mm of leftward midline shift. Mild areas of relative hyperdensity within the infarct could represent petechial hemorrhage or spared brain parenchyma. MRI could further characterize. 2. Remote  inferior left cerebellar infarct. Electronically Signed   By: Margaretha Sheffield M.D.   On: 07/18/2021 15:18   MR BRAIN WO CONTRAST  Addendum Date: 07/18/2021   ADDENDUM REPORT: 07/18/2021 17:14 ADDENDUM: These results were called by telephone at the time of interpretation on 07/18/2021 at 5:13 pm to provider [facility] , who verbally acknowledged these results. Electronically Signed   By: Franchot Gallo M.D.   On: 07/18/2021 17:14   Result Date: 07/18/2021 CLINICAL DATA:  Acute neuro deficit.  Stroke.  Left-sided weakness. EXAM: MRI HEAD WITHOUT CONTRAST TECHNIQUE: Multiplanar, multiecho pulse sequences of the brain and surrounding structures were obtained without intravenous contrast. COMPARISON:  CT head 07/18/2021 FINDINGS: Brain: Acute infarct right MCA territory. Infarct involves the operculum and right frontal lobe. Mild associated petechial hemorrhage in the infarct. Sparing of the basal ganglia and posterior division of the right MCA. Ventricle size normal.  Slight midline shift to the left. Chronic infarct left inferior cerebellum. Mild chronic microvascular ischemic change in the white matter. No mass lesion identified. Vascular: Normal arterial flow voids. Skull and upper cervical spine: Negative Sinuses/Orbits: Paranasal sinuses clear.  Negative orbit Other: None IMPRESSION: Acute infarct right MCA territory with mild petechial hemorrhage. Mild mass-effect and midline shift to the left. Mild chronic microvascular ischemic change in the white matter. Small chronic infarct left PICA territory. Electronically Signed: By: Franchot Gallo M.D. On: 07/18/2021 17:11   EEG adult  Result Date: 07/19/2021 Lora Havens, MD     07/19/2021  5:55 PM Patient Name: Shawn Carattini MRN: [id number] Epilepsy Attending: Lora Havens Referring Physician/Provider: Criss Alvine, DO Date: 07/19/2021 Duration: 25.35 mins Patient history: 71 y.o. male with a PMHx of DM, HLD and HTN who presented to the ED this  afternoon with a chief complaint of left sided facial droop with left arm and leg weakness in conjunction with slurred and slowed speech since Sunday.  EEG to evaluate for seizure Level of alertness: Awake, asleep AEDs during EEG study: None Technical aspects: This EEG study was done with scalp electrodes positioned according to the 10-20 International system of electrode placement. Electrical activity was acquired at a sampling rate of '500Hz'$  and reviewed with a high frequency filter of '70Hz'$  and a low frequency filter of '1Hz'$ . EEG data were recorded continuously and digitally stored. Description: The posterior dominant rhythm consists of 8 Hz activity of moderate voltage (25-35 uV) seen predominantly in posterior head regions, symmetric and reactive to eye opening and eye closing. Sleep was characterized by vertex waves, sleep spindles (12 to 14 Hz), maximal frontocentral region.  EEG showed continuous 3 to 6 Hz theta-delta slowing in right frontotemporal region. Physiologic photic driving was not seen during photic stimulation.  Hyperventilation was not performed.   ABNORMALITY - Continuous slow, right frontotemporal region IMPRESSION: This study is suggestive of cortical dysfunction arising from right frontotemporal region likely secondary to underlying stroke. No seizures or definite epileptiform discharges were seen throughout the recording. Lora Havens   ECHOCARDIOGRAM COMPLETE BUBBLE STUDY  Result Date: 07/19/2021    ECHOCARDIOGRAM REPORT   Patient Name:   JOVANY DISANO Date of Exam: 07/19/2021 Medical Rec #:  [id number]        Height:  69.0 in Accession #:    [id number]       Weight:       234.0 lb Date of Birth:  03/20/1951        BSA:          2.209 m Patient Age:    23 years         BP:           112/50 mmHg Patient Gender: M                HR:           45 bpm. Exam Location:  [facility] Procedure: 2D Echo, Color Doppler, Cardiac Doppler and Saline Contrast Bubble            Study Indications:      Stroke 434.91 / I63.9  History:         Patient has prior history of Echocardiogram examinations, most                  recent 12/01/2019. Risk Factors:Diabetes, Hypertension and                  Dyslipidemia.  Sonographer:     Sherrie Sport Referring Phys:  [id number] AMY N COX Diagnosing Phys: Ida Rogue MD  Sonographer Comments: Suboptimal apical window and suboptimal parasternal window. IMPRESSIONS  1. Left ventricular ejection fraction, by estimation, is 50 to 55%. The left ventricle has low normal function. The left ventricle has no regional wall motion abnormalities. Left ventricular diastolic parameters are consistent with Grade II diastolic dysfunction (pseudonormalization).  2. Right ventricular systolic function is normal. The right ventricular size is normal. There is normal pulmonary artery systolic pressure.  3. The mitral valve is normal in structure. No evidence of mitral valve regurgitation. No evidence of mitral stenosis.  4. The aortic valve is normal in structure. Aortic valve regurgitation is not visualized. No aortic stenosis is present.  5. There is mild dilatation of the aortic root and of the ascending aorta, measuring 40 mm.  6. The inferior vena cava is normal in size with greater than 50% respiratory variability, suggesting right atrial pressure of 3 mmHg.  7. Agitated saline contrast bubble study was negative, with no evidence of any interatrial shunt. FINDINGS  Left Ventricle: Left ventricular ejection fraction, by estimation, is 50 to 55%. The left ventricle has low normal function. The left ventricle has no regional wall motion abnormalities. The left ventricular internal cavity size was normal in size. There is no left ventricular hypertrophy. Left ventricular diastolic parameters are consistent with Grade II diastolic dysfunction (pseudonormalization). Right Ventricle: The right ventricular size is normal. No increase in right ventricular wall thickness. Right ventricular systolic  function is normal. There is normal pulmonary artery systolic pressure. The tricuspid regurgitant velocity is 1.41 m/s, and  with an assumed right atrial pressure of 5 mmHg, the estimated right ventricular systolic pressure is 92.3 mmHg. Left Atrium: Left atrial size was normal in size. Right Atrium: Right atrial size was normal in size. Pericardium: There is no evidence of pericardial effusion. Mitral Valve: The mitral valve is normal in structure. No evidence of mitral valve regurgitation. No evidence of mitral valve stenosis. MV peak gradient, 3.1 mmHg. The mean mitral valve gradient is 1.0 mmHg. Tricuspid Valve: The tricuspid valve is normal in structure. Tricuspid valve regurgitation is not demonstrated. No evidence of tricuspid stenosis. Aortic Valve: The aortic valve is normal in structure. Aortic valve regurgitation is not visualized.  No aortic stenosis is present. Aortic valve mean gradient measures 2.0 mmHg. Aortic valve peak gradient measures 3.7 mmHg. Aortic valve area, by VTI measures 3.00 cm. Pulmonic Valve: The pulmonic valve was normal in structure. Pulmonic valve regurgitation is not visualized. No evidence of pulmonic stenosis. Aorta: The aortic root is normal in size and structure. There is mild dilatation of the aortic root and of the ascending aorta, measuring 40 mm. Venous: The inferior vena cava is normal in size with greater than 50% respiratory variability, suggesting right atrial pressure of 3 mmHg. IAS/Shunts: No atrial level shunt detected by color flow Doppler. Agitated saline contrast was given intravenously to evaluate for intracardiac shunting. Agitated saline contrast bubble study was negative, with no evidence of any interatrial shunt.  LEFT VENTRICLE PLAX 2D LVIDd:         5.30 cm   Diastology LVIDs:         3.00 cm   LV e' medial:    6.20 cm/s LV PW:         1.40 cm   LV E/e' medial:  11.5 LV IVS:        1.10 cm   LV e' lateral:   8.05 cm/s LVOT diam:     2.30 cm   LV E/e'  lateral: 8.8 LV SV:         66 LV SV Index:   30 LVOT Area:     4.15 cm  RIGHT VENTRICLE RV Basal diam:  3.40 cm RV S prime:     11.20 cm/s TAPSE (M-mode): 2.6 cm LEFT ATRIUM             Index        RIGHT ATRIUM           Index LA diam:        3.70 cm 1.68 cm/m   RA Area:     17.00 cm LA Vol (A2C):   59.9 ml 27.12 ml/m  RA Volume:   44.60 ml  20.19 ml/m LA Vol (A4C):   59.5 ml 26.94 ml/m LA Biplane Vol: 64.1 ml 29.02 ml/m  AORTIC VALVE                    PULMONIC VALVE AV Area (Vmax):    2.79 cm     PV Vmax:        0.39 m/s AV Area (Vmean):   2.46 cm     PV Vmean:       31.000 cm/s AV Area (VTI):     3.00 cm     PV VTI:         0.101 m AV Vmax:           96.10 cm/s   PV Peak grad:   0.6 mmHg AV Vmean:          70.000 cm/s  PV Mean grad:   0.0 mmHg AV VTI:            0.220 m      RVOT Peak grad: 3 mmHg AV Peak Grad:      3.7 mmHg AV Mean Grad:      2.0 mmHg LVOT Vmax:         64.60 cm/s LVOT Vmean:        41.400 cm/s LVOT VTI:          0.159 m LVOT/AV VTI ratio: 0.72  AORTA Ao Root diam: 3.87 cm MITRAL VALVE  TRICUSPID VALVE MV Area (PHT): 3.05 cm    TR Peak grad:   8.0 mmHg MV Area VTI:   1.80 cm    TR Vmax:        141.00 cm/s MV Peak grad:  3.1 mmHg MV Mean grad:  1.0 mmHg    SHUNTS MV Vmax:       0.88 m/s    Systemic VTI:  0.16 m MV Vmean:      49.3 cm/s   Systemic Diam: 2.30 cm MV Decel Time: 249 msec    Pulmonic VTI:  0.158 m MV E velocity: 71.10 cm/s MV A velocity: 60.00 cm/s MV E/A ratio:  1.18 Ida Rogue MD Electronically signed by Ida Rogue MD Signature Date/Time: 07/19/2021/3:14:07 PM    Final     Microbiology: Results for orders placed or performed during the hospital encounter of 07/18/21  Resp Panel by RT-PCR (Flu A&B, Covid) Nasopharyngeal Swab     Status: None   Collection Time: 07/18/21  6:13 PM   Specimen: Nasopharyngeal Swab; Nasopharyngeal(NP) swabs in vial transport medium  Result Value Ref Range Status   SARS Coronavirus 2 by RT PCR NEGATIVE NEGATIVE Final     Comment: (NOTE) SARS-CoV-2 target nucleic acids are NOT DETECTED.  The SARS-CoV-2 RNA is generally detectable in upper respiratory specimens during the acute phase of infection. The lowest concentration of SARS-CoV-2 viral copies this assay can detect is 138 copies/mL. A negative result does not preclude SARS-Cov-2 infection and should not be used as the sole basis for treatment or other patient management decisions. A negative result may occur with  improper specimen collection/handling, submission of specimen other than nasopharyngeal swab, presence of viral mutation(s) within the areas targeted by this assay, and inadequate number of viral copies(<138 copies/mL). A negative result must be combined with clinical observations, patient history, and epidemiological information. The expected result is Negative.  Fact Sheet for Patients:  EntrepreneurPulse.com.au  Fact Sheet for Healthcare Providers:  IncredibleEmployment.be  This test is no t yet approved or cleared by the [place] FDA and  has been authorized for detection and/or diagnosis of SARS-CoV-2 by FDA under an Emergency Use Authorization (EUA). This EUA will remain  in effect (meaning this test can be used) for the duration of the COVID-19 declaration under Section 564(b)(1) of the Act, 21 U.S.C.section 360bbb-3(b)(1), unless the authorization is terminated  or revoked sooner.       Influenza A by PCR NEGATIVE NEGATIVE Final   Influenza B by PCR NEGATIVE NEGATIVE Final    Comment: (NOTE) The Xpert Xpress SARS-CoV-2/FLU/RSV plus assay is intended as an aid in the diagnosis of influenza from Nasopharyngeal swab specimens and should not be used as a sole basis for treatment. Nasal washings and aspirates are unacceptable for Xpert Xpress SARS-CoV-2/FLU/RSV testing.  Fact Sheet for Patients: EntrepreneurPulse.com.au  Fact Sheet for Healthcare  Providers: IncredibleEmployment.be  This test is not yet approved or cleared by the [place] FDA and has been authorized for detection and/or diagnosis of SARS-CoV-2 by FDA under an Emergency Use Authorization (EUA). This EUA will remain in effect (meaning this test can be used) for the duration of the COVID-19 declaration under Section 564(b)(1) of the Act, 21 U.S.C. section 360bbb-3(b)(1), unless the authorization is terminated or revoked.  Performed at  [facility], [address] [place], [place] [address]     Labs: CBC: Recent Labs  Lab 07/18/21 1432  WBC 9.7  NEUTROABS 7.5  HGB 14.2  HCT 40.8  MCV  89.7  PLT 147   Basic Metabolic Panel: Recent Labs  Lab 07/18/21 1432  NA 137  K 3.6  CL 103  CO2 21*  GLUCOSE 161*  BUN 30*  CREATININE 1.67*  CALCIUM 9.5   Liver Function Tests: Recent Labs  Lab 07/18/21 1432  AST 29  ALT 14  ALKPHOS 33*  BILITOT 1.0  PROT 7.6  ALBUMIN 4.4   CBG: No results for input(s): GLUCAP in the last 168 hours.  Discharge time spent: greater than 30 minutes.  Signed: [name], MD [facility] 07/20/2021

## 2021-07-20 NOTE — Plan of Care (Signed)
Patient is being discharged home to self care. A copy of discharge instructions given. Patient educated on MD orders. Wife will be transportation home. ? ?

## 2021-07-20 NOTE — Consult Note (Signed)
[place] NOTE       Patient ID: George Schmidt MRN: [id number] DOB/AGE: [birth year] 71 y.o.  Admit date: 07/18/2021 Referring Physician Dr. [name] Primary Physician Dr. [name] Primary Cardiologist Dr. Clayborn Bigness Reason for Consultation afib  HPI: The patient is a 71 year old male with a past medical history notable for history of CVA, hypertension, hyperlipidemia, type 2 diabetes who presented to [facility] ED 07/18/2021 with a left-sided facial droop, left arm and leg weakness and with slurred and slowed speech since 3/5.  CT head revealed subacute right MCA territory ischemic infarction.  Cardiology is consulted because of A-fib noted on EKG 3/10.  The patient states that he mostly noticed frequent hiccups and drooling prior to presenting to the ED. He says people kept telling him his speech changed but he didn't seem to notice. During interview today he states he feels alright, denies palpitations, chest pain, shortness of breath, or lower extremity edema. He was seen once by Dr. Clayborn Bigness for palpitations in 02/2020 after his previous CVA and wore a 3 day holter monitor that showed only frequent PACs and PVCs without evidence of atrial fibrillation. Exercise myoview showed normal wall motion and LVEF of 50%  He denies ever having a heart cath performed, nor any stents placed in his heart or anywhere else in his body.   His initial EKGs on 3/8 and 3/9 both showed sinus bradycardia with first-degree AV block.  Review of saved telemetry strip shows atrial fibrillation with PVCs the evening of 3/9 at 1900.  Repeat EKG 07/20/2021 shows A-fib with rate of 106 which appears to be new in onset. He is not on tele during interview this afternoon but his pulse feels irregular.    Review of systems complete and found to be negative unless listed above    Past Medical History:  Diagnosis Date   Arthritis    Basal cell carcinoma 08/25/2019   Left ant. shoulder latera.  Superficial and nodular.    Basal cell carcinoma 05/25/2020   L upper back lateral - ED&C   Basal cell carcinoma 05/25/2020   L shoulder posterior adjacent to white patch - ED&C   Basal cell carcinoma 05/25/2020   L upper medial back - ED&C   Diabetes mellitus without complication ([place])    Hemorrhoids    Hyperlipidemia    Hypertension    Squamous cell carcinoma of skin 04/27/2020   R temple - MOHS 06/16/20 at [place]    Past Surgical History:  Procedure Laterality Date   COLONOSCOPY WITH PROPOFOL N/A 10/21/2014   Procedure: COLONOSCOPY WITH PROPOFOL;  Surgeon: Josefine Class, MD;  Location[facility] ENDOSCOPY;  Service: Endoscopy;  Laterality: N/A;    Medications Prior to Admission  Medication Sig Dispense Refill Last Dose   amLODipine (NORVASC) 10 MG tablet Take 10 mg by mouth daily.   07/17/2021 at [place] (TENORETIC) 50-25 MG per tablet Take 1 tablet by mouth daily.   07/17/2021 at 1930   atorvastatin (LIPITOR) 40 MG tablet Take 1 tablet (40 mg total) by mouth daily. 30 tablet 11 07/17/2021 at 1930   baclofen (LIORESAL) 10 MG tablet Take 10 mg by mouth 3 (three) times daily.   07/18/2021 at 0800   clopidogrel (PLAVIX) 75 MG tablet Take 1 tablet (75 mg total) by mouth daily. 90 tablet 0 07/17/2021 at 1930   fenofibrate 160 MG tablet Take 160 mg by mouth daily.   07/17/2021 at 1930   ibuprofen (ADVIL) 200  MG tablet Take 200 mg by mouth every 6 (six) hours as needed.   prn at prn   ketoconazole (NIZORAL) 2 % cream For scale apply to the ears BID. 60 g 1 Past Month at unknown   metFORMIN (GLUCOPHAGE-XR) 500 MG 24 hr tablet Take 2 tablets by mouth once daily   07/18/2021 at 0800   potassium chloride SA (K-DUR,KLOR-CON) 20 MEQ tablet Take 20 mEq by mouth 2 (two) times daily.   07/18/2021 at 0800   sildenafil (VIAGRA) 100 MG tablet Take by mouth.    prn at prn   mupirocin ointment (BACTROBAN) 2 % Apply 1 application topically daily. (Patient not taking: Reported on  07/12/2021) 22 g 0 Not Taking   Social History   Socioeconomic History   Marital status: Unknown    Spouse name: Not on file   Number of children: Not on file   Years of education: Not on file   Highest education level: Not on file  Occupational History   Not on file  Tobacco Use   Smoking status: Former   Smokeless tobacco: Never  Substance and Sexual Activity   Alcohol use: Yes    Alcohol/week: 1.0 standard drink    Types: 1 Cans of beer per week   Drug use: No   Sexual activity: Not on file  Other Topics Concern   Not on file  Social History Narrative   Not on file   Social Determinants of Health   Financial Resource Strain: Not on file  Food Insecurity: Not on file  Transportation Needs: Not on file  Physical Activity: Not on file  Stress: Not on file  Social Connections: Not on file  Intimate Partner Violence: Not on file    Family History  Problem Relation Age of Onset   Heart attack Mother    Clotting disorder Father       Review of systems complete and found to be negative unless listed above    PHYSICAL EXAM General: Elderly caucasian male, well nourished, in no acute distress. Sitting upright in recliner HEENT:  Normocephalic and atraumatic. Left facial droop.  Neck:  No JVD.  Lungs: Normal respiratory effort on room air. Clear bilaterally to auscultation. No wheezes, crackles, rhonchi.  Heart: irregularly irregular rhythm. Normal S1 and S2 without gallops or murmurs. Radial & DP pulses 2+ bilaterally. Abdomen: Non-distended appearing.  Msk: Normal strength and tone for age. Extremities: Warm and well perfused. No clubbing, cyanosis. No lower extremity  edema.  Neuro: Alert and oriented X 3.  Psych:  Answers questions appropriately.   Labs:   Lab Results  Component Value Date   WBC 9.7 07/18/2021   HGB 14.2 07/18/2021   HCT 40.8 07/18/2021   MCV 89.7 07/18/2021   PLT 273 07/18/2021    Recent Labs  Lab 07/18/21 1432  NA 137  K 3.6  CL  103  CO2 21*  BUN 30*  CREATININE 1.67*  CALCIUM 9.5  PROT 7.6  BILITOT 1.0  ALKPHOS 33*  ALT 14  AST 29  GLUCOSE 161*   No results found for: CKTOTAL, CKMB, CKMBINDEX, TROPONINI  Lab Results  Component Value Date   CHOL 111 07/19/2021   CHOL 170 12/01/2019   Lab Results  Component Value Date   HDL 26 (L) 07/19/2021   HDL 26 (L) 12/01/2019   Lab Results  Component Value Date   LDLCALC 58 07/19/2021   LDLCALC 77 12/01/2019   Lab Results  Component Value Date  TRIG 135 07/19/2021   TRIG 337 (H) 12/01/2019   Lab Results  Component Value Date   CHOLHDL 4.3 07/19/2021   CHOLHDL 6.5 12/01/2019   No results found for: LDLDIRECT    Radiology: CT ANGIO HEAD NECK W WO CM  Result Date: 07/18/2021 CLINICAL DATA:  Stroke.  Left-sided weakness.  Speech abnormality EXAM: CT ANGIOGRAPHY HEAD AND NECK TECHNIQUE: Multidetector CT imaging of the head and neck was performed using the standard protocol during bolus administration of intravenous contrast. Multiplanar CT image reconstructions and MIPs were obtained to evaluate the vascular anatomy. Carotid stenosis measurements (when applicable) are obtained utilizing NASCET criteria, using the distal internal carotid diameter as the denominator. RADIATION DOSE REDUCTION: This exam was performed according to the departmental dose-optimization program which includes automated exposure control, adjustment of the mA and/or kV according to patient size and/or use of iterative reconstruction technique. CONTRAST:  17m OMNIPAQUE IOHEXOL 350 MG/ML SOLN COMPARISON:  CT head and MRI head 07/18/2021. CT angio head neck 11/30/2019 FINDINGS: CTA NECK FINDINGS Aortic arch: Standard branching. Imaged portion shows no evidence of aneurysm or dissection. No significant stenosis of the major arch vessel origins. Mild atherosclerotic calcification aortic arch and proximal great vessels Right carotid system: Mild atherosclerotic disease right carotid bifurcation  without significant stenosis. No plaque ulceration or thrombus. Left carotid system: Mild atherosclerotic disease left carotid bifurcation. Negative for stenosis. No plaque ulceration or thrombus. Vertebral arteries: Right vertebral artery is patent to the basilar without significant stenosis Left vertebral artery has decreased enhancement proximally and then occludes at C2. Left vertebral artery remains occluded and does not contribute to the basilar. 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  No acute skeletal abnormality. Other neck: Negative for mass or adenopathy in the neck. Upper chest: Lung apices clear bilaterally. Review of the MIP images confirms the above findings CTA HEAD FINDINGS Anterior circulation: Atherosclerotic calcification in the cavernous carotid bilaterally with mild stenosis bilaterally moderate stenosis left supraclinoid internal carotid artery. Right M1 widely patent. Right middle cerebral artery bifurcation patent. There is decreased opacification of the superior division of the right MCA with probable small branch occlusions. This correlates with the right MCA infarct on MRI and CT. Inferior division right MCA widely patent. Anterior cerebral arteries patent bilaterally without stenosis. Left MCA patent without stenosis. Posterior circulation: Right vertebral artery supplies the basilar. Mild stenosis distally. Right PICA patent. Distal left vertebral artery is occluded. Left PICA not visualized. Basilar widely patent. Superior cerebellar and posterior cerebral arteries patent bilaterally. Venous sinuses: Limited venous enhancement due to arterial phase scanning. Anatomic variants: None Review of the MIP images confirms the above findings IMPRESSION: 1. Poor perfusion of the superior branch of the right MCA likely due to distal emboli. This corresponds to the area of acute infarct on CT and MRI. 2. Mild  atherosclerotic disease carotid bifurcation bilaterally without significant stenosis. Mild stenosis cavernous carotid bilaterally. Moderate supraclinoid internal carotid artery stenosis on the left 3. 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 Mild stenosis distal right vertebral artery. 4. These results were called by telephone at the time of interpretation on 07/18/2021 at 5:42 pm to [facility], who verbally acknowledged these results. Electronically Signed   By: CFranchot GalloM.D.   On: 07/18/2021 17:44   CT HEAD WO CONTRAST  Result Date: 07/18/2021 CLINICAL DATA:  Stroke, follow up EXAM: CT HEAD WITHOUT CONTRAST TECHNIQUE:  Contiguous axial images were obtained from the base of the skull through the vertex without intravenous contrast. RADIATION DOSE REDUCTION: This exam was performed according to the departmental dose-optimization program which includes automated exposure control, adjustment of the mA and/or kV according to patient size and/or use of iterative reconstruction technique. COMPARISON:  11/30/2019. FINDINGS: Brain: Hypoattenuation loss of gray-white differentiation in the right insula and overlying frontal lobe, compatible with acute or subacute right anterior MCA distribution infarct. Mild areas of relative hyperdensity within the infarct could represent petechial hemorrhage or spared brain parenchyma. Mild mass effect with 3 mm of leftward midline shift at the foramen of [place]. Remote inferior left cerebellar infarct. Vascular: No hyperdense vessel identified. Calcific intracranial atherosclerosis Skull: No acute fracture. Sinuses/Orbits: Clear sinuses.  Unremarkable orbits. Other: No mastoid effusions IMPRESSION: 1. Acute or subacute right anterior MCA territory infarct. Mild mass effect with 3 mm of leftward midline shift. Mild areas of relative hyperdensity within the infarct could represent petechial hemorrhage or spared  brain parenchyma. MRI could further characterize. 2. Remote inferior left cerebellar infarct. Electronically Signed   By: Margaretha Sheffield M.D.   On: 07/18/2021 15:18   MR BRAIN WO CONTRAST  Addendum Date: 07/18/2021   ADDENDUM REPORT: 07/18/2021 17:14 ADDENDUM: These results were called by telephone at the time of interpretation on 07/18/2021 at 5:13 pm to provider [facility] , who verbally acknowledged these results. Electronically Signed   By: Franchot Gallo M.D.   On: 07/18/2021 17:14   Result Date: 07/18/2021 CLINICAL DATA:  Acute neuro deficit.  Stroke.  Left-sided weakness. EXAM: MRI HEAD WITHOUT CONTRAST TECHNIQUE: Multiplanar, multiecho pulse sequences of the brain and surrounding structures were obtained without intravenous contrast. COMPARISON:  CT head 07/18/2021 FINDINGS: Brain: Acute infarct right MCA territory. Infarct involves the operculum and right frontal lobe. Mild associated petechial hemorrhage in the infarct. Sparing of the basal ganglia and posterior division of the right MCA. Ventricle size normal.  Slight midline shift to the left. Chronic infarct left inferior cerebellum. Mild chronic microvascular ischemic change in the white matter. No mass lesion identified. Vascular: Normal arterial flow voids. Skull and upper cervical spine: Negative Sinuses/Orbits: Paranasal sinuses clear.  Negative orbit Other: None IMPRESSION: Acute infarct right MCA territory with mild petechial hemorrhage. Mild mass-effect and midline shift to the left. Mild chronic microvascular ischemic change in the white matter. Small chronic infarct left PICA territory. Electronically Signed: By: Franchot Gallo M.D. On: 07/18/2021 17:11   EEG adult  Result Date: 07/19/2021 Lora Havens, MD     07/19/2021  5:55 PM Patient Name: Elad Macphail MRN: [id number] Epilepsy Attending: Lora Havens Referring Physician/Provider: Criss Alvine, DO Date: 07/19/2021 Duration: 25.35 mins Patient history: 71 y.o. male with  a PMHx of DM, HLD and HTN who presented to the ED this afternoon with a chief complaint of left sided facial droop with left arm and leg weakness in conjunction with slurred and slowed speech since Sunday.  EEG to evaluate for seizure Level of alertness: Awake, asleep AEDs during EEG study: None Technical aspects: This EEG study was done with scalp electrodes positioned according to the 10-20 International system of electrode placement. Electrical activity was acquired at a sampling rate of '500Hz'$  and reviewed with a high frequency filter of '70Hz'$  and a low frequency filter of '1Hz'$ . EEG data were recorded continuously and digitally stored. Description: The posterior dominant rhythm consists of 8 Hz activity of moderate voltage (25-35 uV) seen predominantly in posterior head regions,  symmetric and reactive to eye opening and eye closing. Sleep was characterized by vertex waves, sleep spindles (12 to 14 Hz), maximal frontocentral region.  EEG showed continuous 3 to 6 Hz theta-delta slowing in right frontotemporal region. Physiologic photic driving was not seen during photic stimulation.  Hyperventilation was not performed.   ABNORMALITY - Continuous slow, right frontotemporal region IMPRESSION: This study is suggestive of cortical dysfunction arising from right frontotemporal region likely secondary to underlying stroke. No seizures or definite epileptiform discharges were seen throughout the recording. Lora Havens   ECHOCARDIOGRAM COMPLETE BUBBLE STUDY  Result Date: 07/19/2021    ECHOCARDIOGRAM REPORT   Patient Name:   THAYDEN LEMIRE Date of Exam: 07/19/2021 Medical Rec #:  [id number]        Height:       69.0 in Accession #:    [id number]       Weight:       234.0 lb Date of Birth:  12-18-1950        BSA:          2.209 m Patient Age:    64 years         BP:           112/50 mmHg Patient Gender: M                HR:           45 bpm. Exam Location:  [facility] Procedure: 2D Echo, Color Doppler, Cardiac Doppler and  Saline Contrast Bubble            Study Indications:     Stroke 434.91 / I63.9  History:         Patient has prior history of Echocardiogram examinations, most                  recent 12/01/2019. Risk Factors:Diabetes, Hypertension and                  Dyslipidemia.  Sonographer:     Sherrie Sport Referring Phys:  [id number] AMY N COX Diagnosing Phys: Ida Rogue MD  Sonographer Comments: Suboptimal apical window and suboptimal parasternal window. IMPRESSIONS  1. Left ventricular ejection fraction, by estimation, is 50 to 55%. The left ventricle has low normal function. The left ventricle has no regional wall motion abnormalities. Left ventricular diastolic parameters are consistent with Grade II diastolic dysfunction (pseudonormalization).  2. Right ventricular systolic function is normal. The right ventricular size is normal. There is normal pulmonary artery systolic pressure.  3. The mitral valve is normal in structure. No evidence of mitral valve regurgitation. No evidence of mitral stenosis.  4. The aortic valve is normal in structure. Aortic valve regurgitation is not visualized. No aortic stenosis is present.  5. There is mild dilatation of the aortic root and of the ascending aorta, measuring 40 mm.  6. The inferior vena cava is normal in size with greater than 50% respiratory variability, suggesting right atrial pressure of 3 mmHg.  7. Agitated saline contrast bubble study was negative, with no evidence of any interatrial shunt. FINDINGS  Left Ventricle: Left ventricular ejection fraction, by estimation, is 50 to 55%. The left ventricle has low normal function. The left ventricle has no regional wall motion abnormalities. The left ventricular internal cavity size was normal in size. There is no left ventricular hypertrophy. Left ventricular diastolic parameters are consistent with Grade II diastolic dysfunction (pseudonormalization). Right Ventricle: The right ventricular size is normal. No increase in right  ventricular wall thickness. Right ventricular systolic function is normal. There is normal pulmonary artery systolic pressure. The tricuspid regurgitant velocity is 1.41 m/s, and  with an assumed right atrial pressure of 5 mmHg, the estimated right ventricular systolic pressure is 93.5 mmHg. Left Atrium: Left atrial size was normal in size. Right Atrium: Right atrial size was normal in size. Pericardium: There is no evidence of pericardial effusion. Mitral Valve: The mitral valve is normal in structure. No evidence of mitral valve regurgitation. No evidence of mitral valve stenosis. MV peak gradient, 3.1 mmHg. The mean mitral valve gradient is 1.0 mmHg. Tricuspid Valve: The tricuspid valve is normal in structure. Tricuspid valve regurgitation is not demonstrated. No evidence of tricuspid stenosis. Aortic Valve: The aortic valve is normal in structure. Aortic valve regurgitation is not visualized. No aortic stenosis is present. Aortic valve mean gradient measures 2.0 mmHg. Aortic valve peak gradient measures 3.7 mmHg. Aortic valve area, by VTI measures 3.00 cm. Pulmonic Valve: The pulmonic valve was normal in structure. Pulmonic valve regurgitation is not visualized. No evidence of pulmonic stenosis. Aorta: The aortic root is normal in size and structure. There is mild dilatation of the aortic root and of the ascending aorta, measuring 40 mm. Venous: The inferior vena cava is normal in size with greater than 50% respiratory variability, suggesting right atrial pressure of 3 mmHg. IAS/Shunts: No atrial level shunt detected by color flow Doppler. Agitated saline contrast was given intravenously to evaluate for intracardiac shunting. Agitated saline contrast bubble study was negative, with no evidence of any interatrial shunt.  LEFT VENTRICLE PLAX 2D LVIDd:         5.30 cm   Diastology LVIDs:         3.00 cm   LV e' medial:    6.20 cm/s LV PW:         1.40 cm   LV E/e' medial:  11.5 LV IVS:        1.10 cm   LV e'  lateral:   8.05 cm/s LVOT diam:     2.30 cm   LV E/e' lateral: 8.8 LV SV:         66 LV SV Index:   30 LVOT Area:     4.15 cm  RIGHT VENTRICLE RV Basal diam:  3.40 cm RV S prime:     11.20 cm/s TAPSE (M-mode): 2.6 cm LEFT ATRIUM             Index        RIGHT ATRIUM           Index LA diam:        3.70 cm 1.68 cm/m   RA Area:     17.00 cm LA Vol (A2C):   59.9 ml 27.12 ml/m  RA Volume:   44.60 ml  20.19 ml/m LA Vol (A4C):   59.5 ml 26.94 ml/m LA Biplane Vol: 64.1 ml 29.02 ml/m  AORTIC VALVE                    PULMONIC VALVE AV Area (Vmax):    2.79 cm     PV Vmax:        0.39 m/s AV Area (Vmean):   2.46 cm     PV Vmean:       31.000 cm/s AV Area (VTI):     3.00 cm     PV VTI:         0.101 m AV Vmax:  96.10 cm/s   PV Peak grad:   0.6 mmHg AV Vmean:          70.000 cm/s  PV Mean grad:   0.0 mmHg AV VTI:            0.220 m      RVOT Peak grad: 3 mmHg AV Peak Grad:      3.7 mmHg AV Mean Grad:      2.0 mmHg LVOT Vmax:         64.60 cm/s LVOT Vmean:        41.400 cm/s LVOT VTI:          0.159 m LVOT/AV VTI ratio: 0.72  AORTA Ao Root diam: 3.87 cm MITRAL VALVE               TRICUSPID VALVE MV Area (PHT): 3.05 cm    TR Peak grad:   8.0 mmHg MV Area VTI:   1.80 cm    TR Vmax:        141.00 cm/s MV Peak grad:  3.1 mmHg MV Mean grad:  1.0 mmHg    SHUNTS MV Vmax:       0.88 m/s    Systemic VTI:  0.16 m MV Vmean:      49.3 cm/s   Systemic Diam: 2.30 cm MV Decel Time: 249 msec    Pulmonic VTI:  0.158 m MV E velocity: 71.10 cm/s MV A velocity: 60.00 cm/s MV E/A ratio:  1.18 Ida Rogue MD Electronically signed by Ida Rogue MD Signature Date/Time: 07/19/2021/3:14:07 PM    Final     ECHO LVEF 50-55% with g2DD, mild aortic root dilation at 46m  TELEMETRY reviewed by me: AF with PVCS, rate of 85 from saved strip 1900 on 3/9  EKG reviewed by me: AF with rate 106, LAD   ASSESSMENT AND PLAN:  The patient is a 71year old male with a past medical history notable for history of CVA, hypertension,  hyperlipidemia, type 2 diabetes [facility] a left-sided facial droop, left arm and leg weakness and with slurred and slowed speech since 3/5.  CT head revealed subacute right MCA territory ischemic infarction.  Cardiology is consulted because of A-fib noted on EKG 3/10.  #new onset paroxysmal AF #acute right MCA ischemic infarct with petechial hemorrhages  -Repeat CT head to assess for stability of petechial hemorrhages  -if stable, recommend eliquis '5mg'$  BID and discontinue asa and plavix -continue high intensity statin therapy  -Chadsvasc 5 -will need follow up with Dr. CClayborn Bignessin 2 weeks at discharge.   This patient's plan of care was discussed and created with Dr. RDonnelly Angelicaand he is in agreement.  Signed: LTristan Schroeder, PA-C 07/20/2021, 12:53 PM [facility]

## 2021-07-20 NOTE — Progress Notes (Signed)
Speech Language Pathology Treatment: Dysphagia  ?Patient Details ?Name: John Williamsen ?MRN: [id number] ?DOB: June 10, 1950 ?Today's Date: 07/20/2021 ?Time: 1330-1400 ?SLP Time Calculation (min) (ACUTE ONLY): 30 min ? ?Assessment / Plan / Recommendation ?Clinical Impression ? Pt visited this am for treatment and follow up of toleration of diet. Pt. and nsg reported coughing this am with breakfast. When questioned, pt reported the most difficulty with grits. He reported coughing for dseveral minutes after eating trying to expectorate granuals of the grits. Pt observed during lunch meal and was tolerating chopped hamburger well. No s/s of aspiration. Needed extended time to chew each bite. Min cues needed to use a lingual sweep to clear oral residue. Pt also used small sips of liquid to help decrease oral residue. Encouraged meds crushed in applesauce or one at a time with something thick like applesauce rather than thin liquids. Pt is hopeful to be discharged home today. Rec OP ST at discharge for speech deficits. Prognosis good. ST to follow up 1-2 days if Pt  is still here. ?  ?HPI HPI: Per 55 H&P "Mr. 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   CT the head without contrast in the ED was read as acute or subacute right anterior MCA territory infarct.  Mild mass effect with 3 mm leftward midline shift.  Mild areas of relative hyperdensity within the infarct could represent petechial hemorrhage or spared brain parenchyma.  Remote inferior left cerebellar infarct. MRI of the brain without contrast: Acute infarct right MCA territory with mild petechial hemorrhage.  Mild mass effect and midline shift to the left.   Mild chronic microvascular ischemic changes in the white matter.  Small chronic infarct left PICA territory.     CTA of the head neck with and without contrast: Poor perfusion of the superior branch of the right MCA likely due to distal emboli.  This corresponds to the area of acute infarct on CT and MRI.  Mild atherosclerotic disease carotid bifurcation bilaterally without significant stenosis.  Mild stenosis cavernous carotid bilaterally.  Moderate supraclinoid internal carotid artery stenosis on the left.  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  Mild stenosis distal right vertebral artery.     At bedside, he is able to tell me his name, age, current calendar year, and location of ER hospital.     He states that he had facial droop that started on Monday. [name], he developed slurring of speech. His wife and daughter made him come into hospital. He denies difficulty walking. He states his wife noticed, he did not eat like normal. He states in the ED, he was drinking lemonade, and he had difficulty, and was gagging.     He states he did not come to hospital on Monday because he states, 'I'm stubborn'.      He lost about 15 in one year. He has been drinking more water instead of soda." ?  ?   ?SLP Plan ? Continue with current plan of care ? ?  ?  ?Recommendations for follow up therapy are one component of a multi-disciplinary discharge planning process, led by the attending physician.  Recommendations may be updated based on patient status, additional functional criteria and insurance authorization. ?  ? ?  Recommendations  ?Diet recommendations: Dysphagia 3 (mechanical soft) ?Liquids provided via: Teaspoon;Cup;No straw ?Medication Administration: Crushed with puree ?Supervision: Patient able to self feed;Intermittent supervision to cue for compensatory strategies ?Compensations: Minimize environmental distractions;Slow rate;Small sips/bites;Follow solids with  liquid ?Postural Changes and/or Swallow Maneuvers: Out of bed for meals;Seated upright 90 degrees;Upright 30-60 min after meal  ?   ?    ?   ? ? ? ? Oral Care Recommendations: Oral care QID;Patient independent with oral care ?Follow Up Recommendations: Outpatient SLP ?Assistance recommended at discharge: Intermittent Supervision/Assistance ?SLP Visit Diagnosis: Dysphagia, oropharyngeal phase (R13.12) ?Attention and concentration deficit following: Cerebral infarction ?Plan: Continue with current plan of care ? ? ? ? ?  ?  ? ? ?Lucila Maine ? ?07/20/2021, 2:06 PM ?

## 2021-07-20 NOTE — Progress Notes (Addendum)
EEG: This study is suggestive of cortical dysfunction arising from right frontotemporal region likely secondary to underlying stroke. No seizures or definite epileptiform discharges were seen throughout the recording ? ?TTE with bubble study: ? 1. Left ventricular ejection fraction, by estimation, is 50 to 55%. The  ?left ventricle has low normal function. The left ventricle has no regional  ?wall motion abnormalities. Left ventricular diastolic parameters are  ?consistent with Grade II diastolic dysfunction (pseudonormalization).  ? 2. Right ventricular systolic function is normal. The right ventricular  ?size is normal. There is normal pulmonary artery systolic pressure.  ? 3. The mitral valve is normal in structure. No evidence of mitral valve  ?regurgitation. No evidence of mitral stenosis.  ? 4. The aortic valve is normal in structure. Aortic valve regurgitation is  ?not visualized. No aortic stenosis is present.  ? 5. There is mild dilatation of the aortic root and of the ascending  ?aorta, measuring 40 mm.  ? 6. The inferior vena cava is normal in size with greater than 50%  ?respiratory variability, suggesting right atrial pressure of 3 mmHg.  ? 7. Agitated saline contrast bubble study was negative, with no evidence  ?of any interatrial shunt.  ? ?A/R:  71 y.o. male with a PMHx of DM, HLD and HTN who presented to the with left sided facial droop with left arm and leg weakness in conjunction with slurred and slowed speech since Sunday.  ?- Stroke work up has been completed ?- Continue ASA and Plavix ?- Continue atorvastatin ?- Outpatient Neurology follow up ? ?Addendum: ?- Second EKG this admission showed atrial fibrillation. Has had palpitations as an outpatient previously. Most likely has paroxysmal atrial fibrillation (new diagnosis).  ?- Anticoagulation would therefore be indicated for secondary stroke prevention. Cardiology has communicated opinion that as long as safe to anticoagulate given the  petechial hemorrhage at the location of the stroke on MRI performed 3/8, then to anticoagulate. ?- Ordering a repeat CT to ensure that the petechial hemorrhage is stable. If stable, then start Eliquis and d/c ASA/Plavix.  ? ?Electronically signed: Dr. Kerney Elbe ? ?

## 2021-12-13 ENCOUNTER — Ambulatory Visit: Payer: Medicare Other | Admitting: Dermatology

## 2021-12-13 DIAGNOSIS — L219 Seborrheic dermatitis, unspecified: Secondary | ICD-10-CM

## 2021-12-13 DIAGNOSIS — L578 Other skin changes due to chronic exposure to nonionizing radiation: Secondary | ICD-10-CM | POA: Diagnosis not present

## 2021-12-13 DIAGNOSIS — B079 Viral wart, unspecified: Secondary | ICD-10-CM

## 2021-12-13 NOTE — Patient Instructions (Signed)
Due to recent changes in healthcare laws, you may see results of your pathology and/or laboratory studies on MyChart before the doctors have had a chance to review them. We understand that in some cases there may be results that are confusing or concerning to you. Please understand that not all results are received at the same time and often the doctors may need to interpret multiple results in order to provide you with the best plan of care or course of treatment. Therefore, we ask that you please give us 2 business days to thoroughly review all your results before contacting the office for clarification. Should we see a critical lab result, you will be contacted sooner.   If You Need Anything After Your Visit  If you have any questions or concerns for your doctor, please call our main line at [phone] and press option 4 to reach your doctor's medical assistant. If no one answers, please leave a voicemail as directed and we will return your call as soon as possible. Messages left after 4 pm will be answered the following business day.   You may also send us a message via MyChart. We typically respond to MyChart messages within 1-2 business days.  For prescription refills, please ask your pharmacy to contact our office. Our fax number is [phone].  If you have an urgent issue when the clinic is closed that cannot wait until the next business day, you can page your doctor at the number below.    Please note that while we do our best to be available for urgent issues outside of office hours, we are not available 24/7.   If you have an urgent issue and are unable to reach us, you may choose to seek medical care at your doctor's office, retail clinic, urgent care center, or emergency room.  If you have a medical emergency, please immediately call 911 or go to the emergency department.  Pager Numbers  - Dr. Kowalski: [phone]  - Dr. Moye: [phone]  - Dr. Stewart:  [phone]  In the event of inclement weather, please call our main line at [phone] for an update on the status of any delays or closures.  Dermatology Medication Tips: Please keep the boxes that topical medications come in in order to help keep track of the instructions about where and how to use these. Pharmacies typically print the medication instructions only on the boxes and not directly on the medication tubes.   If your medication is too expensive, please contact our office at [phone] option 4 or send us a message through MyChart.   We are unable to tell what your co-pay for medications will be in advance as this is different depending on your insurance coverage. However, we may be able to find a substitute medication at lower cost or fill out paperwork to get insurance to cover a needed medication.   If a prior authorization is required to get your medication covered by your insurance company, please allow us 1-2 business days to complete this process.  Drug prices often vary depending on where the prescription is filled and some pharmacies may offer cheaper prices.  The website www.goodrx.com contains coupons for medications through different pharmacies. The prices here do not account for what the cost may be with help from insurance (it may be cheaper with your insurance), but the website can give you the price if you did not use any insurance.  - You can print the associated coupon and take it with   your prescription to the pharmacy.  - You may also stop by our office during regular business hours and pick up a GoodRx coupon card.  - If you need your prescription sent electronically to a different pharmacy, notify our office through [facility] MyChart or by phone at [phone] option 4.     Si Usted Necesita Algo Despus de Su Visita  Tambin puede enviarnos un mensaje a travs de MyChart. Por lo general respondemos a los mensajes de MyChart en el transcurso de 1 a 2  das hbiles.  Para renovar recetas, por favor pida a su farmacia que se ponga en contacto con nuestra oficina. Nuestro nmero de fax es el [phone].  Si tiene un asunto urgente cuando la clnica est cerrada y que no puede esperar hasta el siguiente da hbil, puede llamar/localizar a su doctor(a) al nmero que aparece a continuacin.   Por favor, tenga en cuenta que aunque hacemos todo lo posible para estar disponibles para asuntos urgentes fuera del horario de oficina, no estamos disponibles las 24 horas del da, los 7 das de la semana.   Si tiene un problema urgente y no puede comunicarse con nosotros, puede optar por buscar atencin mdica  en el consultorio de su doctor(a), en una clnica privada, en un centro de atencin urgente o en una sala de emergencias.  Si tiene una emergencia mdica, por favor llame inmediatamente al 911 o vaya a la sala de emergencias.  Nmeros de bper  - Dr. Kowalski: [phone]  - Dra. Moye: [phone]  - Dra. Stewart: [phone]  En caso de inclemencias del tiempo, por favor llame a nuestra lnea principal al [phone] para una actualizacin sobre el estado de cualquier retraso o cierre.  Consejos para la medicacin en dermatologa: Por favor, guarde las cajas en las que vienen los medicamentos de uso tpico para ayudarle a seguir las instrucciones sobre dnde y cmo usarlos. Las farmacias generalmente imprimen las instrucciones del medicamento slo en las cajas y no directamente en los tubos del medicamento.   Si su medicamento es muy caro, por favor, pngase en contacto con nuestra oficina llamando al [phone] y presione la opcin 4 o envenos un mensaje a travs de MyChart.   No podemos decirle cul ser su copago por los medicamentos por adelantado ya que esto es diferente dependiendo de la cobertura de su seguro. Sin embargo, es posible que podamos encontrar un medicamento sustituto a menor costo o llenar un formulario para que el  seguro cubra el medicamento que se considera necesario.   Si se requiere una autorizacin previa para que su compaa de seguros cubra su medicamento, por favor permtanos de 1 a 2 das hbiles para completar este proceso.  Los precios de los medicamentos varan con frecuencia dependiendo del lugar de dnde se surte la receta y alguna farmacias pueden ofrecer precios ms baratos.  El sitio web www.goodrx.com tiene cupones para medicamentos de diferentes farmacias. Los precios aqu no tienen en cuenta lo que podra costar con la ayuda del seguro (puede ser ms barato con su seguro), pero el sitio web puede darle el precio si no utiliz ningn seguro.  - Puede imprimir el cupn correspondiente y llevarlo con su receta a la farmacia.  - Tambin puede pasar por nuestra oficina durante el horario de atencin regular y recoger una tarjeta de cupones de GoodRx.  - Si necesita que su receta se enve electrnicamente a una farmacia diferente, informe a nuestra oficina a travs de [facility]    o por telfono llamando al [phone] y presione la opcin 4.  

## 2021-12-13 NOTE — Progress Notes (Signed)
Follow-Up Visit   Subjective  George Schmidt is a 71 y.o. male who presents for the following: Wart (Pubic area - treated with LN2, patient thinks there may be some residual left), Actinic Keratosis (Patient states that he did use a prescription that came in a squirt bottle that made his face blister and peel. He used it BID x 5 days about two weeks ago), and Seborrheic dermatitis (Patient was unaware that Ketoconazole 2% cream was sent in and hasn't been using it. Currently he is using Head and Shoulders shampoo QW while showering).   The following portions of the chart were reviewed this encounter and updated as appropriate:   Tobacco  Allergies  Meds  Problems  Med Hx  Surg Hx  Fam Hx      Review of Systems:  No other skin or systemic complaints except as noted in HPI or Assessment and Plan.  Objective  Well appearing patient in no apparent distress; mood and affect are within normal limits.  A focused examination was performed including the face, ears, and groin. Relevant physical exam findings are noted in the Assessment and Plan.  Pubic Verrucous papules -- Discussed viral etiology and contagion.   B/L ears, beard area Pink patches with greasy scale.   Face, ears, scalp Some pinkness from topical chemotherapy cream.    Assessment & Plan  Viral warts, unspecified type Pubic  Discussed viral etiology and risk of spread.  Discussed multiple treatments may be required to clear warts.  Discussed possible post-treatment dyspigmentation and risk of recurrence.  Continue Mupirocin 2% ointment to healing site daily.  Prior to procedure, discussed risks of blister formation, small wound, skin dyspigmentation, or rare scar following cryotherapy. Recommend Vaseline ointment to treated areas while healing.   Destruction of lesion - Pubic Complexity: simple   Destruction method: cryotherapy   Informed consent: discussed and consent obtained   Timeout:  patient name,  date of birth, surgical site, and procedure verified Lesion destroyed using liquid nitrogen: Yes   Region frozen until ice ball extended beyond lesion: Yes   Outcome: patient tolerated procedure well with no complications   Post-procedure details: wound care instructions given    Seborrheic dermatitis B/L ears, beard area  Seborrheic Dermatitis  -  is a chronic persistent rash characterized by pinkness and scaling most commonly of the mid face but also can occur on the scalp (dandruff), ears; mid chest, mid back and groin.  It tends to be exacerbated by stress and cooler weather.  People who have neurologic disease may experience new onset or exacerbation of existing seborrheic dermatitis.  The condition is not curable but treatable and can be controlled.  Continue H&S shampoo when showering. Discussed Ketoconazole 2% shampoo and cream but patient states condition isn't bothersome and declines treatment today.  Related Medications ketoconazole (NIZORAL) 2 % cream For scale apply to the ears BID.  Actinic skin damage Face, ears, scalp  Actinic Damage - chronic, secondary to cumulative UV radiation exposure/sun exposure over time - diffuse scaly erythematous macules with underlying dyspigmentation - Recommend daily broad spectrum sunscreen SPF 30+ to sun-exposed areas, reapply every 2 hours as needed.  - Recommend staying in the shade or wearing long sleeves, sun glasses (UVA+UVB protection) and wide brim hats (4-inch brim around the entire circumference of the hat). - Call for new or changing lesions.  Good response to 5FU/calcipotriene treatment   Seborrheic Keratoses - Stuck-on, waxy, tan-brown papules and/or plaques  - Benign-appearing - Discussed benign etiology  and prognosis. - Observe - Call for any changes  Return in about 3 months (around 03/15/2022) for TBSE.  Luther Redo, CMA, am acting as scribe for Forest Gleason, MD .  Documentation: I have reviewed the above  documentation for accuracy and completeness, and I agree with the above.  Forest Gleason, MD

## 2021-12-26 ENCOUNTER — Encounter: Payer: Self-pay | Admitting: Dermatology

## 2022-03-13 DIAGNOSIS — Z9841 Cataract extraction status, right eye: Secondary | ICD-10-CM

## 2022-03-13 DIAGNOSIS — Z9842 Cataract extraction status, left eye: Secondary | ICD-10-CM

## 2022-03-13 HISTORY — DX: Cataract extraction status, right eye: Z98.41

## 2022-03-13 HISTORY — DX: Cataract extraction status, right eye: Z98.42

## 2022-03-14 ENCOUNTER — Encounter: Payer: Self-pay | Admitting: Ophthalmology

## 2022-03-19 NOTE — Discharge Instructions (Signed)

## 2022-03-20 ENCOUNTER — Ambulatory Visit: Payer: Medicare Other | Admitting: Anesthesiology

## 2022-03-20 ENCOUNTER — Encounter
Admission: RE | Disposition: A | Discharge status: Home / Self Care | Payer: Self-pay | Source: Home / Self Care | Attending: Ophthalmology

## 2022-03-20 ENCOUNTER — Other Ambulatory Visit: Payer: Self-pay

## 2022-03-20 ENCOUNTER — Encounter: Payer: Self-pay | Admitting: Ophthalmology

## 2022-03-20 ENCOUNTER — Ambulatory Visit
Admission: RE | Admit: 2022-03-20 | Discharge: 2022-03-20 | Disposition: A | Discharge status: Home / Self Care | Payer: Medicare Other | Attending: Ophthalmology | Admitting: Ophthalmology

## 2022-03-20 ENCOUNTER — Encounter: Payer: Medicare Other | Admitting: Dermatology

## 2022-03-20 DIAGNOSIS — Z7984 Long term (current) use of oral hypoglycemic drugs: Secondary | ICD-10-CM | POA: Diagnosis not present

## 2022-03-20 DIAGNOSIS — E1136 Type 2 diabetes mellitus with diabetic cataract: Secondary | ICD-10-CM | POA: Insufficient documentation

## 2022-03-20 DIAGNOSIS — I4891 Unspecified atrial fibrillation: Secondary | ICD-10-CM | POA: Diagnosis not present

## 2022-03-20 DIAGNOSIS — H2512 Age-related nuclear cataract, left eye: Secondary | ICD-10-CM | POA: Diagnosis present

## 2022-03-20 DIAGNOSIS — Z87891 Personal history of nicotine dependence: Secondary | ICD-10-CM | POA: Insufficient documentation

## 2022-03-20 DIAGNOSIS — Z8673 Personal history of transient ischemic attack (TIA), and cerebral infarction without residual deficits: Secondary | ICD-10-CM | POA: Insufficient documentation

## 2022-03-20 DIAGNOSIS — E119 Type 2 diabetes mellitus without complications: Secondary | ICD-10-CM

## 2022-03-20 HISTORY — DX: Headache, unspecified: R51.9

## 2022-03-20 HISTORY — DX: Cerebral infarction, unspecified: I63.9

## 2022-03-20 HISTORY — DX: Presence of dental prosthetic device (complete) (partial): Z97.2

## 2022-03-20 HISTORY — PX: CATARACT EXTRACTION W/PHACO: SHX586

## 2022-03-20 LAB — GLUCOSE, CAPILLARY
Glucose-Capillary: 135 mg/dL — ABNORMAL HIGH (ref 70–99)
Glucose-Capillary: 142 mg/dL — ABNORMAL HIGH (ref 70–99)

## 2022-03-20 SURGERY — PHACOEMULSIFICATION, CATARACT, WITH IOL INSERTION
Anesthesia: Monitor Anesthesia Care | Site: Eye | Laterality: Left

## 2022-03-20 MED ORDER — BRIMONIDINE TARTRATE-TIMOLOL 0.2-0.5 % OP SOLN
OPHTHALMIC | Status: DC | PRN
  Administered 2022-03-20: 1 [drp] via OPHTHALMIC

## 2022-03-20 MED ORDER — SIGHTPATH DOSE#1 BSS IO SOLN
INTRAOCULAR | Status: DC | PRN
  Administered 2022-03-20: 1 mL via INTRAMUSCULAR

## 2022-03-20 MED ORDER — SIGHTPATH DOSE#1 BSS IO SOLN
INTRAOCULAR | Status: DC | PRN
  Administered 2022-03-20: 76 mL via OPHTHALMIC

## 2022-03-20 MED ORDER — SIGHTPATH DOSE#1 BSS IO SOLN
INTRAOCULAR | Status: DC | PRN
  Administered 2022-03-20: 15 mL

## 2022-03-20 MED ORDER — MIDAZOLAM HCL 2 MG/2ML IJ SOLN
INTRAMUSCULAR | Status: DC | PRN
  Administered 2022-03-20 (×2): 1 mg via INTRAVENOUS

## 2022-03-20 MED ORDER — SIGHTPATH DOSE#1 NA HYALUR & NA CHOND-NA HYALUR IO KIT
PACK | INTRAOCULAR | Status: DC | PRN
  Administered 2022-03-20: 1 via OPHTHALMIC

## 2022-03-20 MED ORDER — FENTANYL CITRATE (PF) 100 MCG/2ML IJ SOLN
INTRAMUSCULAR | Status: DC | PRN
  Administered 2022-03-20: 50 ug via INTRAVENOUS

## 2022-03-20 MED ORDER — CEFUROXIME OPHTHALMIC INJECTION 1 MG/0.1 ML
INJECTION | OPHTHALMIC | Status: DC | PRN
  Administered 2022-03-20: 1 mg via OPHTHALMIC

## 2022-03-20 MED ORDER — ARMC OPHTHALMIC DILATING DROPS
1.0000 | OPHTHALMIC | Status: DC | PRN
  Administered 2022-03-20 (×3): 1 via OPHTHALMIC

## 2022-03-20 MED ORDER — TETRACAINE HCL 0.5 % OP SOLN
1.0000 [drp] | OPHTHALMIC | Status: DC | PRN
  Administered 2022-03-20 (×3): 1 [drp] via OPHTHALMIC

## 2022-03-20 SURGICAL SUPPLY — 10 items
CATARACT SUITE SIGHTPATH (MISCELLANEOUS) ×1 IMPLANT
FEE CATARACT SUITE SIGHTPATH (MISCELLANEOUS) ×1 IMPLANT
GLOVE SRG 8 PF TXTR STRL LF DI (GLOVE) ×1 IMPLANT
GLOVE SURG ENC TEXT LTX SZ7.5 (GLOVE) ×1 IMPLANT
GLOVE SURG UNDER POLY LF SZ8 (GLOVE) ×1
LENS IOL TECNIS EYHANCE 24.0 (Intraocular Lens) IMPLANT
NDL FILTER BLUNT 18X1 1/2 (NEEDLE) ×1 IMPLANT
NEEDLE FILTER BLUNT 18X1 1/2 (NEEDLE) ×1 IMPLANT
SYR 3ML LL SCALE MARK (SYRINGE) ×1 IMPLANT
WATER STERILE IRR 250ML POUR (IV SOLUTION) ×1 IMPLANT

## 2022-03-20 NOTE — Transfer of Care (Signed)
Immediate Anesthesia Transfer of Care Note  Patient: George Schmidt  Procedure(s) Performed: CATARACT EXTRACTION PHACO AND INTRAOCULAR LENS PLACEMENT (IOC) LEFT DIABETIC (Left: Eye)  Patient Location: PACU  Anesthesia Type: MAC  Level of Consciousness: awake, alert  and patient cooperative  Airway and Oxygen Therapy: Patient Spontanous Breathing and Patient connected to supplemental oxygen  Post-op Assessment: Post-op Vital signs reviewed, Patient's Cardiovascular Status Stable, Respiratory Function Stable, Patent Airway and No signs of Nausea or vomiting  Post-op Vital Signs: Reviewed and stable  Complications: There were no known notable events for this encounter.

## 2022-03-20 NOTE — H&P (Signed)
[facility]   Primary Care Physician:  [name], MD Ophthalmologist: Dr. Leandrew Koyanagi  Pre-Procedure History & Physical: HPI:  George Schmidt is a 71 y.o. male here for ophthalmic surgery.   Past Medical History:  Diagnosis Date   Arthritis    Basal cell carcinoma 08/25/2019   Left ant. shoulder latera. Superficial and nodular.    Basal cell carcinoma 05/25/2020   L upper back lateral - ED&C   Basal cell carcinoma 05/25/2020   L shoulder posterior adjacent to white patch - ED&C   Basal cell carcinoma 05/25/2020   L upper medial back - ED&C   Dental bridge present    permanent, bottom   Diabetes mellitus without complication ([place])    Headache    Hemorrhoids    Hyperlipidemia    Hypertension    Squamous cell carcinoma of skin 04/27/2020   R temple - MOHS 06/16/20 at [place]   Stroke [facility])    07/18/21  11/30/19    Past Surgical History:  Procedure Laterality Date   COLONOSCOPY WITH PROPOFOL N/A 10/21/2014   Procedure: COLONOSCOPY WITH PROPOFOL;  Surgeon: Josefine Class, MD;  Location: [facility] ENDOSCOPY;  Service: Endoscopy;  Laterality: N/A;    Prior to Admission medications   Medication Sig Start Date End Date Taking? Authorizing Provider  amLODipine (NORVASC) 10 MG tablet Take 10 mg by mouth daily.   Yes [provider]  apixaban (ELIQUIS) 5 MG TABS tablet Take 1 tablet (5 mg total) by mouth 2 (two) times daily. 07/20/21  Yes [name], MD  atenolol-chlorthalidone (TENORETIC) 50-25 MG per tablet Take 1 tablet by mouth daily.   Yes [provider]  atorvastatin (LIPITOR) 40 MG tablet Take 1 tablet (40 mg total) by mouth daily. 12/01/19 03/14/22 Yes Lorella Nimrod, MD  fenofibrate 160 MG tablet Take 160 mg by mouth daily.   Yes [provider]  ibuprofen (ADVIL) 200 MG tablet Take 200 mg by mouth every 6 (six) hours as needed.   Yes [provider]  metFORMIN (GLUCOPHAGE-XR) 500 MG 24 hr tablet 500 mg in the  morning and at bedtime. Take 2 tablets by mouth once daily 11/29/19  Yes [provider]  Multiple Vitamins-Minerals (PRESERVISION AREDS PO) Take by mouth 2 (two) times daily.   Yes [provider]  potassium chloride SA (K-DUR,KLOR-CON) 20 MEQ tablet Take 20 mEq by mouth 2 (two) times daily.   Yes [provider]  sildenafil (VIAGRA) 100 MG tablet Take by mouth.  12/22/18  Yes [provider]  ketoconazole (NIZORAL) 2 % cream For scale apply to the ears BID. Patient not taking: Reported on 12/13/2021 07/12/21   Alfonso Patten, MD    Allergies as of 02/18/2022 - Review Complete 12/26/2021  Allergen Reaction Noted   Amoxicillin Hives 10/18/2014   Clarithromycin Hives 10/18/2014    Family History  Problem Relation Age of Onset   Heart attack Mother    Clotting disorder Father     Social History   Socioeconomic History   Marital status: Unknown    Spouse name: Not on file   Number of children: Not on file   Years of education: Not on file   Highest education level: Not on file  Occupational History   Not on file  Tobacco Use   Smoking status: Former   Smokeless tobacco: Never  Vaping Use   Vaping Use: Never used  Substance and Sexual Activity   Alcohol use: Yes  Alcohol/week: 1.0 standard drink of alcohol    Types: 1 Cans of beer per week    Comment: rare   Drug use: No   Sexual activity: Not on file  Other Topics Concern   Not on file  Social History Narrative   Not on file   Social Determinants of Health   Financial Resource Strain: Not on file  Food Insecurity: Not on file  Transportation Needs: Not on file  Physical Activity: Not on file  Stress: Not on file  Social Connections: Not on file  Intimate Partner Violence: Not on file    Review of Systems: See HPI, otherwise negative ROS  Physical Exam: BP 139/88   Temp 99.2 F (37.3 C) (Tympanic)   Ht 5' 9.02" (1.753 m)   Wt 104.8 kg   SpO2 97%   BMI 34.10 kg/m   General:   Alert,  pleasant and cooperative in NAD Head:  Normocephalic and atraumatic. Lungs:  Clear to auscultation.    Heart:  Regular rate and rhythm.   Impression/Plan: George Schmidt is here for ophthalmic surgery.  Risks, benefits, limitations, and alternatives regarding ophthalmic surgery have been reviewed with the patient.  Questions have been answered.  All parties agreeable.   Leandrew Koyanagi, MD  03/20/2022, 7:31 AM

## 2022-03-20 NOTE — Op Note (Signed)
OPERATIVE NOTE  Prospero Mahnke [id number] 03/20/2022   PREOPERATIVE DIAGNOSIS:  Nuclear sclerotic cataract left eye. H25.12   POSTOPERATIVE DIAGNOSIS:    Nuclear sclerotic cataract left eye.     PROCEDURE:  Phacoemusification with posterior chamber intraocular lens placement of the left eye  Ultrasound time: Procedure(s) with comments: CATARACT EXTRACTION PHACO AND INTRAOCULAR LENS PLACEMENT (IOC) LEFT DIABETIC (Left) - Diabetic 15.12 01:39.2  LENS:   Implant Name Type Inv. Item Serial No. Manufacturer Lot No. LRB No. Used Action  LENS IOL TECNIS EYHANCE 24.0 - [id number] Intraocular Lens LENS IOL TECNIS EYHANCE 24.0 [id number] [employer]  Left 1 Implanted      SURGEON:  Wyonia Hough, MD   ANESTHESIA:  Topical with tetracaine drops and 2% Xylocaine jelly, augmented with 1% preservative-free intracameral lidocaine.    COMPLICATIONS:  None.   DESCRIPTION OF PROCEDURE:  The patient was identified in the holding room and transported to the operating room and placed in the supine position under the operating microscope.  The left eye was identified as the operative eye and it was prepped and draped in the usual sterile ophthalmic fashion.   A 1 millimeter clear-corneal paracentesis was made at the 1:30 position.  0.5 ml of preservative-free 1% lidocaine was injected into the anterior chamber.  The anterior chamber was filled with Viscoat viscoelastic.  A 2.4 millimeter keratome was used to make a near-clear corneal incision at the 10:30 position.  .  A curvilinear capsulorrhexis was made with a cystotome and capsulorrhexis forceps.  Balanced salt solution was used to hydrodissect and hydrodelineate the nucleus.   Phacoemulsification was then used in stop and chop fashion to remove the lens nucleus and epinucleus.  The remaining cortex was then removed using the irrigation and aspiration handpiece. Provisc was then placed into the capsular bag to distend it for lens placement.   A lens was then injected into the capsular bag.  The remaining viscoelastic was aspirated.   Wounds were hydrated with balanced salt solution.  The anterior chamber was inflated to a physiologic pressure with balanced salt solution.  No wound leaks were noted. Cefuroxime 0.1 ml of a '10mg'$ /ml solution was injected into the anterior chamber for a dose of 1 mg of intracameral antibiotic at the completion of the case.   Timolol and Brimonidine drops were applied to the eye.  The patient was taken to the recovery room in stable condition without complications of anesthesia or surgery.  Jaquay Posthumus 03/20/2022, 7:58 AM

## 2022-03-20 NOTE — Anesthesia Preprocedure Evaluation (Signed)
Anesthesia Evaluation  Patient identified by MRN, date of birth, ID band Patient awake    Reviewed: Allergy & Precautions, NPO status , Patient's Chart, lab work & pertinent test results  History of Anesthesia Complications Negative for: history of anesthetic complications  Airway Mallampati: III  TM Distance: >3 FB Neck ROM: Full    Dental no notable dental hx. (+) Teeth Intact, Loose,    Pulmonary sleep apnea , neg COPD, Patient abstained from smoking.Not current smoker, former smoker   Pulmonary exam normal breath sounds clear to auscultation       Cardiovascular Exercise Tolerance: Poor METShypertension, (-) CAD and (-) Past MI + dysrhythmias Atrial Fibrillation  Rhythm:Regular Rate:Normal - Systolic murmurs    Neuro/Psych  Headaches Residual cognitive deficits CVA, Residual Symptoms  negative psych ROS   GI/Hepatic ,neg GERD  ,,(+)     (-) substance abuse    Endo/Other  diabetes, Oral Hypoglycemic Agents    Renal/GU CRFRenal disease     Musculoskeletal   Abdominal   Peds  Hematology   Anesthesia Other Findings Past Medical History: No date: Arthritis 08/25/2019: Basal cell carcinoma     Comment:  Left ant. shoulder latera. Superficial and nodular.  05/25/2020: Basal cell carcinoma     Comment:  L upper back lateral - ED&C 05/25/2020: Basal cell carcinoma     Comment:  L shoulder posterior adjacent to white patch - ED&C 05/25/2020: Basal cell carcinoma     Comment:  L upper medial back - ED&C No date: Dental bridge present     Comment:  permanent, bottom No date: Diabetes mellitus without complication ([place]) No date: Headache No date: Hemorrhoids No date: Hyperlipidemia No date: Hypertension 04/27/2020: Squamous cell carcinoma of skin     Comment:  R temple - MOHS 06/16/20 at [place] No date: Stroke [facility])     Comment:  07/18/21  11/30/19  Reproductive/Obstetrics                              Anesthesia Physical Anesthesia Plan  ASA: 3  Anesthesia Plan: MAC   Post-op Pain Management:    Induction: Intravenous  PONV Risk Score and Plan: 2 and Midazolam  Airway Management Planned: Nasal Cannula  Additional Equipment:   Intra-op Plan:   Post-operative Plan:   Informed Consent: I have reviewed the patients History and Physical, chart, labs and discussed the procedure including the risks, benefits and alternatives for the proposed anesthesia with the patient or authorized representative who has indicated his/her understanding and acceptance.       Plan Discussed with: CRNA and Surgeon  Anesthesia Plan Comments: (Explained risks of anesthesia, including PONV, and rare emergencies such as cardiac events, respiratory problems, and allergic reactions, requiring invasive intervention. Discussed the role of CRNA in patient's perioperative care. Patient understands. )       Anesthesia Quick Evaluation

## 2022-03-20 NOTE — Anesthesia Postprocedure Evaluation (Signed)
Anesthesia Post Note  Patient: George Schmidt  Procedure(s) Performed: CATARACT EXTRACTION PHACO AND INTRAOCULAR LENS PLACEMENT (IOC) LEFT DIABETIC (Left: Eye)  Patient location during evaluation: PACU Anesthesia Type: MAC Level of consciousness: awake and alert Pain management: pain level controlled Vital Signs Assessment: post-procedure vital signs reviewed and stable Respiratory status: spontaneous breathing, nonlabored ventilation, respiratory function stable and patient connected to nasal cannula oxygen Cardiovascular status: stable and blood pressure returned to baseline Postop Assessment: no apparent nausea or vomiting Anesthetic complications: no   There were no known notable events for this encounter.   Last Vitals:  Vitals:   03/20/22 0800 03/20/22 0808  BP: 121/71 108/67  Pulse: (!) 48 (!) 46  Resp: (!) 9 13  Temp: (!) 36.1 C   SpO2: 97% 96%    Last Pain:  Vitals:   03/20/22 0808  TempSrc:   PainSc: 0-No pain                 Arita Miss

## 2022-03-21 ENCOUNTER — Encounter: Payer: Self-pay | Admitting: Ophthalmology

## 2022-04-02 NOTE — Discharge Instructions (Signed)

## 2022-04-03 ENCOUNTER — Encounter
Admission: RE | Disposition: A | Discharge status: Home / Self Care | Payer: Self-pay | Source: Home / Self Care | Attending: Ophthalmology

## 2022-04-03 ENCOUNTER — Ambulatory Visit
Admission: RE | Admit: 2022-04-03 | Discharge: 2022-04-03 | Disposition: A | Discharge status: Home / Self Care | Payer: Medicare Other | Attending: Ophthalmology | Admitting: Ophthalmology

## 2022-04-03 ENCOUNTER — Encounter: Payer: Self-pay | Admitting: Ophthalmology

## 2022-04-03 ENCOUNTER — Ambulatory Visit: Payer: Medicare Other | Admitting: Anesthesiology

## 2022-04-03 ENCOUNTER — Other Ambulatory Visit: Payer: Self-pay

## 2022-04-03 DIAGNOSIS — I6930 Unspecified sequelae of cerebral infarction: Secondary | ICD-10-CM | POA: Diagnosis not present

## 2022-04-03 DIAGNOSIS — Z87891 Personal history of nicotine dependence: Secondary | ICD-10-CM | POA: Insufficient documentation

## 2022-04-03 DIAGNOSIS — H2511 Age-related nuclear cataract, right eye: Secondary | ICD-10-CM | POA: Diagnosis present

## 2022-04-03 DIAGNOSIS — Z85828 Personal history of other malignant neoplasm of skin: Secondary | ICD-10-CM | POA: Diagnosis not present

## 2022-04-03 DIAGNOSIS — E1136 Type 2 diabetes mellitus with diabetic cataract: Secondary | ICD-10-CM | POA: Diagnosis not present

## 2022-04-03 DIAGNOSIS — I4891 Unspecified atrial fibrillation: Secondary | ICD-10-CM | POA: Insufficient documentation

## 2022-04-03 DIAGNOSIS — Z7901 Long term (current) use of anticoagulants: Secondary | ICD-10-CM | POA: Insufficient documentation

## 2022-04-03 DIAGNOSIS — Z7984 Long term (current) use of oral hypoglycemic drugs: Secondary | ICD-10-CM | POA: Insufficient documentation

## 2022-04-03 DIAGNOSIS — E785 Hyperlipidemia, unspecified: Secondary | ICD-10-CM | POA: Diagnosis not present

## 2022-04-03 DIAGNOSIS — G4730 Sleep apnea, unspecified: Secondary | ICD-10-CM | POA: Insufficient documentation

## 2022-04-03 DIAGNOSIS — Z79899 Other long term (current) drug therapy: Secondary | ICD-10-CM | POA: Insufficient documentation

## 2022-04-03 HISTORY — PX: CATARACT EXTRACTION W/PHACO: SHX586

## 2022-04-03 LAB — GLUCOSE, CAPILLARY: Glucose-Capillary: 149 mg/dL — ABNORMAL HIGH (ref 70–99)

## 2022-04-03 SURGERY — PHACOEMULSIFICATION, CATARACT, WITH IOL INSERTION
Anesthesia: Monitor Anesthesia Care | Site: Eye | Laterality: Right

## 2022-04-03 MED ORDER — MOXIFLOXACIN HCL 0.5 % OP SOLN
OPHTHALMIC | Status: DC | PRN
  Administered 2022-04-03: .2 mL via OPHTHALMIC

## 2022-04-03 MED ORDER — ARMC OPHTHALMIC DILATING DROPS
1.0000 | OPHTHALMIC | Status: DC | PRN
  Administered 2022-04-03 (×3): 1 via OPHTHALMIC

## 2022-04-03 MED ORDER — TETRACAINE HCL 0.5 % OP SOLN
1.0000 [drp] | OPHTHALMIC | Status: DC | PRN
  Administered 2022-04-03 (×3): 1 [drp] via OPHTHALMIC

## 2022-04-03 MED ORDER — MIDAZOLAM HCL 2 MG/2ML IJ SOLN
INTRAMUSCULAR | Status: DC | PRN
  Administered 2022-04-03: 2 mg via INTRAVENOUS

## 2022-04-03 MED ORDER — SIGHTPATH DOSE#1 BSS IO SOLN
INTRAOCULAR | Status: DC | PRN
  Administered 2022-04-03: 1 mL via INTRAMUSCULAR

## 2022-04-03 MED ORDER — SIGHTPATH DOSE#1 BSS IO SOLN
INTRAOCULAR | Status: DC | PRN
  Administered 2022-04-03: 15 mL

## 2022-04-03 MED ORDER — LACTATED RINGERS IV SOLN: INTRAVENOUS | Status: DC

## 2022-04-03 MED ORDER — SIGHTPATH DOSE#1 BSS IO SOLN
INTRAOCULAR | Status: DC | PRN
  Administered 2022-04-03: 63 mL via OPHTHALMIC

## 2022-04-03 MED ORDER — SIGHTPATH DOSE#1 NA HYALUR & NA CHOND-NA HYALUR IO KIT
PACK | INTRAOCULAR | Status: DC | PRN
  Administered 2022-04-03: 1 via OPHTHALMIC

## 2022-04-03 MED ORDER — FENTANYL CITRATE (PF) 100 MCG/2ML IJ SOLN
INTRAMUSCULAR | Status: DC | PRN
  Administered 2022-04-03: 50 ug via INTRAVENOUS

## 2022-04-03 MED ORDER — BRIMONIDINE TARTRATE-TIMOLOL 0.2-0.5 % OP SOLN
OPHTHALMIC | Status: DC | PRN
  Administered 2022-04-03: 1 [drp] via OPHTHALMIC

## 2022-04-03 SURGICAL SUPPLY — 10 items
CATARACT SUITE SIGHTPATH (MISCELLANEOUS) ×1 IMPLANT
FEE CATARACT SUITE SIGHTPATH (MISCELLANEOUS) ×1 IMPLANT
GLOVE SRG 8 PF TXTR STRL LF DI (GLOVE) ×1 IMPLANT
GLOVE SURG ENC TEXT LTX SZ7.5 (GLOVE) ×1 IMPLANT
GLOVE SURG UNDER POLY LF SZ8 (GLOVE) ×1
LENS IOL TECNIS EYHANCE 24.0 (Intraocular Lens) IMPLANT
NDL FILTER BLUNT 18X1 1/2 (NEEDLE) ×1 IMPLANT
NEEDLE FILTER BLUNT 18X1 1/2 (NEEDLE) ×1 IMPLANT
SYR 3ML LL SCALE MARK (SYRINGE) ×1 IMPLANT
WATER STERILE IRR 250ML POUR (IV SOLUTION) ×1 IMPLANT

## 2022-04-03 NOTE — Transfer of Care (Signed)
Immediate Anesthesia Transfer of Care Note  Patient: George Schmidt  Procedure(s) Performed: CATARACT EXTRACTION PHACO AND INTRAOCULAR LENS PLACEMENT (IOC) RIGHT DIABETIC (Right: Eye)  Patient Location: PACU  Anesthesia Type: MAC  Level of Consciousness: awake, alert  and patient cooperative  Airway and Oxygen Therapy: Patient Spontanous Breathing and Patient connected to supplemental oxygen  Post-op Assessment: Post-op Vital signs reviewed, Patient's Cardiovascular Status Stable, Respiratory Function Stable, Patent Airway and No signs of Nausea or vomiting  Post-op Vital Signs: Reviewed and stable  Complications: There were no known notable events for this encounter.

## 2022-04-03 NOTE — H&P (Signed)
[facility]   Primary Care Physician:  [name], MD Ophthalmologist: Dr. Leandrew Koyanagi  Pre-Procedure History & Physical: HPI:  George Schmidt is a 71 y.o. male here for ophthalmic surgery.   Past Medical History:  Diagnosis Date   Arthritis    Basal cell carcinoma 08/25/2019   Left ant. shoulder latera. Superficial and nodular.    Basal cell carcinoma 05/25/2020   L upper back lateral - ED&C   Basal cell carcinoma 05/25/2020   L shoulder posterior adjacent to white patch - ED&C   Basal cell carcinoma 05/25/2020   L upper medial back - ED&C   Dental bridge present    permanent, bottom   Diabetes mellitus without complication ([place])    Headache    Hemorrhoids    Hyperlipidemia    Hypertension    Squamous cell carcinoma of skin 04/27/2020   R temple - MOHS 06/16/20 at [place]   Stroke [facility])    07/18/21  11/30/19    Past Surgical History:  Procedure Laterality Date   CATARACT EXTRACTION W/PHACO Left 03/20/2022   Procedure: CATARACT EXTRACTION PHACO AND INTRAOCULAR LENS PLACEMENT ([place]) LEFT DIABETIC;  Surgeon: Leandrew Koyanagi, MD;  Location: [place];  Service: Ophthalmology;  Laterality: Left;  Diabetic 15.12 01:39.2   COLONOSCOPY WITH PROPOFOL N/A 10/21/2014   Procedure: COLONOSCOPY WITH PROPOFOL;  Surgeon: Josefine Class, MD;  Location: [facility] ENDOSCOPY;  Service: Endoscopy;  Laterality: N/A;    Prior to Admission medications   Medication Sig Start Date End Date Taking? Authorizing Provider  amLODipine (NORVASC) 10 MG tablet Take 10 mg by mouth daily.   Yes [provider]  apixaban (ELIQUIS) 5 MG TABS tablet Take 1 tablet (5 mg total) by mouth 2 (two) times daily. 07/20/21  Yes [name], MD  atenolol-chlorthalidone (TENORETIC) 50-25 MG per tablet Take 1 tablet by mouth daily.   Yes [provider]  atorvastatin (LIPITOR) 40 MG tablet Take 1 tablet (40 mg total) by mouth daily. 12/01/19 04/03/22 Yes Lorella Nimrod, MD  fenofibrate 160 MG tablet Take 160 mg by mouth daily.   Yes [provider]  ibuprofen (ADVIL) 200 MG tablet Take 200 mg by mouth every 6 (six) hours as needed.   Yes [provider]  metFORMIN (GLUCOPHAGE-XR) 500 MG 24 hr tablet 500 mg in the morning and at bedtime. Take 2 tablets by mouth once daily 11/29/19  Yes [provider]  Multiple Vitamins-Minerals (PRESERVISION AREDS PO) Take by mouth 2 (two) times daily.   Yes [provider]  potassium chloride SA (K-DUR,KLOR-CON) 20 MEQ tablet Take 20 mEq by mouth 2 (two) times daily.   Yes [provider]  sildenafil (VIAGRA) 100 MG tablet Take by mouth.  12/22/18  Yes [provider]  ketoconazole (NIZORAL) 2 % cream For scale apply to the ears BID. Patient not taking: Reported on 12/13/2021 07/12/21   Alfonso Patten, MD    Allergies as of 02/18/2022 - Review Complete 12/26/2021  Allergen Reaction Noted   Amoxicillin Hives 10/18/2014   Clarithromycin Hives 10/18/2014    Family History  Problem Relation Age of Onset   Heart attack Mother    Clotting disorder Father     Social History   Socioeconomic History   Marital status: Unknown    Spouse name: Not on file   Number of children: Not on file   Years of education: Not on file   Highest education level: Not on file  Occupational History  Not on file  Tobacco Use   Smoking status: Former   Smokeless tobacco: Never  Vaping Use   Vaping Use: Never used  Substance and Sexual Activity   Alcohol use: Yes    Alcohol/week: 1.0 standard drink of alcohol    Types: 1 Cans of beer per week    Comment: rare   Drug use: No   Sexual activity: Not on file  Other Topics Concern   Not on file  Social History Narrative   Not on file   Social Determinants of Health   Financial Resource Strain: Not on file  Food Insecurity: Not on file  Transportation Needs: Not on file  Physical Activity: Not on file  Stress: Not on file   Social Connections: Not on file  Intimate Partner Violence: Not on file    Review of Systems: See HPI, otherwise negative ROS  Physical Exam: BP (!) 143/85   Pulse 61   Temp (!) 97.1 F (36.2 C) (Temporal)   Resp 12   Ht 5' 9.02" (1.753 m)   Wt 105.2 kg   SpO2 97%   BMI 34.24 kg/m  General:   Alert,  pleasant and cooperative in NAD Head:  Normocephalic and atraumatic. Lungs:  Clear to auscultation.    Heart:  Regular rate and rhythm.   Impression/Plan: George Schmidt is here for ophthalmic surgery.  Risks, benefits, limitations, and alternatives regarding ophthalmic surgery have been reviewed with the patient.  Questions have been answered.  All parties agreeable.   Leandrew Koyanagi, MD  04/03/2022, 7:30 AM

## 2022-04-03 NOTE — Anesthesia Postprocedure Evaluation (Signed)
Anesthesia Post Note  Patient: Marquelle Musgrave  Procedure(s) Performed: CATARACT EXTRACTION PHACO AND INTRAOCULAR LENS PLACEMENT (IOC) RIGHT DIABETIC (Right: Eye)  Patient location during evaluation: PACU Anesthesia Type: MAC Level of consciousness: awake and alert Pain management: pain level controlled Vital Signs Assessment: post-procedure vital signs reviewed and stable Respiratory status: spontaneous breathing, nonlabored ventilation, respiratory function stable and patient connected to nasal cannula oxygen Cardiovascular status: stable and blood pressure returned to baseline Postop Assessment: no apparent nausea or vomiting Anesthetic complications: no   There were no known notable events for this encounter.   Last Vitals:  Vitals:   04/03/22 0822 04/03/22 0829  BP: 110/79 109/73  Pulse: 67 61  Resp: 18 13  Temp: (!) 36.4 C   SpO2: 96% 97%    Last Pain:  Vitals:   04/03/22 0829  TempSrc:   PainSc: 0-No pain                 Martha Clan

## 2022-04-03 NOTE — Anesthesia Preprocedure Evaluation (Signed)
Anesthesia Evaluation  Patient identified by MRN, date of birth, ID band Patient awake    Reviewed: Allergy & Precautions, NPO status , Patient's Chart, lab work & pertinent test results  History of Anesthesia Complications Negative for: history of anesthetic complications  Airway Mallampati: III  TM Distance: >3 FB Neck ROM: Full    Dental no notable dental hx. (+) Teeth Intact, Loose,    Pulmonary sleep apnea , neg COPD, Patient abstained from smoking.Not current smoker, former smoker   Pulmonary exam normal breath sounds clear to auscultation       Cardiovascular Exercise Tolerance: Poor METShypertension, (-) CAD and (-) Past MI + dysrhythmias Atrial Fibrillation  Rhythm:Regular Rate:Normal - Systolic murmurs    Neuro/Psych  Headaches Residual cognitive deficits CVA, Residual Symptoms  negative psych ROS   GI/Hepatic ,neg GERD  ,,(+)     (-) substance abuse    Endo/Other  diabetes, Oral Hypoglycemic Agents    Renal/GU CRFRenal disease     Musculoskeletal   Abdominal   Peds  Hematology   Anesthesia Other Findings Past Medical History: No date: Arthritis 08/25/2019: Basal cell carcinoma     Comment:  Left ant. shoulder latera. Superficial and nodular.  05/25/2020: Basal cell carcinoma     Comment:  L upper back lateral - ED&C 05/25/2020: Basal cell carcinoma     Comment:  L shoulder posterior adjacent to white patch - ED&C 05/25/2020: Basal cell carcinoma     Comment:  L upper medial back - ED&C No date: Dental bridge present     Comment:  permanent, bottom No date: Diabetes mellitus without complication ([place]) No date: Headache No date: Hemorrhoids No date: Hyperlipidemia No date: Hypertension 04/27/2020: Squamous cell carcinoma of skin     Comment:  R temple - MOHS 06/16/20 at [place] No date: Stroke [facility])     Comment:  07/18/21  11/30/19  Reproductive/Obstetrics                               Anesthesia Physical Anesthesia Plan  ASA: 3  Anesthesia Plan: MAC   Post-op Pain Management:    Induction: Intravenous  PONV Risk Score and Plan: 2 and Midazolam  Airway Management Planned: Nasal Cannula  Additional Equipment:   Intra-op Plan:   Post-operative Plan:   Informed Consent: I have reviewed the patients History and Physical, chart, labs and discussed the procedure including the risks, benefits and alternatives for the proposed anesthesia with the patient or authorized representative who has indicated his/her understanding and acceptance.       Plan Discussed with: CRNA and Surgeon  Anesthesia Plan Comments: (Explained risks of anesthesia, including PONV, and rare emergencies such as cardiac events, respiratory problems, and allergic reactions, requiring invasive intervention. Discussed the role of CRNA in patient's perioperative care. Patient understands. )        Anesthesia Quick Evaluation

## 2022-04-03 NOTE — Op Note (Signed)
LOCATION:  [place]   PREOPERATIVE DIAGNOSIS:    Nuclear sclerotic cataract right eye. H25.11   POSTOPERATIVE DIAGNOSIS:  Nuclear sclerotic cataract right eye.     PROCEDURE:  Phacoemusification with posterior chamber intraocular lens placement of the right eye   ULTRASOUND TIME: Procedure(s) with comments: CATARACT EXTRACTION PHACO AND INTRAOCULAR LENS PLACEMENT (IOC) RIGHT DIABETIC (Right) - Diabetic 8.89 01:08.9  LENS:   Implant Name Type Inv. Item Serial No. Manufacturer Lot No. LRB No. Used Action  LENS IOL TECNIS EYHANCE 24.0 - [id number] Intraocular Lens LENS IOL TECNIS EYHANCE 24.0 [id number] [employer]  Right 1 Implanted         SURGEON:  Wyonia Hough, MD   ANESTHESIA:  Topical with tetracaine drops and 2% Xylocaine jelly, augmented with 1% preservative-free intracameral lidocaine.    COMPLICATIONS:  None.   DESCRIPTION OF PROCEDURE:  The patient was identified in the holding room and transported to the operating room and placed in the supine position under the operating microscope.  The right eye was identified as the operative eye and it was prepped and draped in the usual sterile ophthalmic fashion.   A 1 millimeter clear-corneal paracentesis was made at the 12:00 position.  0.5 ml of preservative-free 1% lidocaine was injected into the anterior chamber. The anterior chamber was filled with Viscoat viscoelastic.  A 2.4 millimeter keratome was used to make a near-clear corneal incision at the 9:00 position.  A curvilinear capsulorrhexis was made with a cystotome and capsulorrhexis forceps.  Balanced salt solution was used to hydrodissect and hydrodelineate the nucleus.   Phacoemulsification was then used in stop and chop fashion to remove the lens nucleus and epinucleus.  The remaining cortex was then removed using the irrigation and aspiration handpiece. Provisc was then placed into the capsular bag to distend it for lens placement.  A lens was then  injected into the capsular bag.  The remaining viscoelastic was aspirated.   Wounds were hydrated with balanced salt solution.  The anterior chamber was inflated to a physiologic pressure with balanced salt solution.  No wound leaks were noted. Vigamox 0.2 ml of a '1mg'$  per ml solution was injected into the anterior chamber for a dose of 0.2 mg of intracameral antibiotic at the completion of the case.   Timolol and Brimonidine drops were applied to the eye.  The patient was taken to the recovery room in stable condition without complications of anesthesia or surgery.   Avangelina Flight 04/03/2022, 8:21 AM

## 2022-04-10 ENCOUNTER — Ambulatory Visit: Payer: Medicare Other | Admitting: Dermatology

## 2022-04-10 ENCOUNTER — Encounter: Payer: Self-pay | Admitting: Dermatology

## 2022-04-10 DIAGNOSIS — D229 Melanocytic nevi, unspecified: Secondary | ICD-10-CM

## 2022-04-10 DIAGNOSIS — L821 Other seborrheic keratosis: Secondary | ICD-10-CM

## 2022-04-10 DIAGNOSIS — L814 Other melanin hyperpigmentation: Secondary | ICD-10-CM

## 2022-04-10 DIAGNOSIS — B353 Tinea pedis: Secondary | ICD-10-CM

## 2022-04-10 DIAGNOSIS — B351 Tinea unguium: Secondary | ICD-10-CM | POA: Diagnosis not present

## 2022-04-10 DIAGNOSIS — D492 Neoplasm of unspecified behavior of bone, soft tissue, and skin: Secondary | ICD-10-CM

## 2022-04-10 DIAGNOSIS — L570 Actinic keratosis: Secondary | ICD-10-CM

## 2022-04-10 DIAGNOSIS — Z85828 Personal history of other malignant neoplasm of skin: Secondary | ICD-10-CM

## 2022-04-10 DIAGNOSIS — Z1283 Encounter for screening for malignant neoplasm of skin: Secondary | ICD-10-CM

## 2022-04-10 DIAGNOSIS — A630 Anogenital (venereal) warts: Secondary | ICD-10-CM | POA: Diagnosis not present

## 2022-04-10 DIAGNOSIS — C44519 Basal cell carcinoma of skin of other part of trunk: Secondary | ICD-10-CM | POA: Diagnosis not present

## 2022-04-10 DIAGNOSIS — L578 Other skin changes due to chronic exposure to nonionizing radiation: Secondary | ICD-10-CM

## 2022-04-10 DIAGNOSIS — Z7189 Other specified counseling: Secondary | ICD-10-CM

## 2022-04-10 NOTE — Patient Instructions (Addendum)
Cryotherapy Aftercare  Wash gently with soap and water everyday.   Apply Vaseline and Band-Aid daily until healed.     - Start 5FU/Calcipotriene cream BID x 4 days to the entire face and BID x 7 days to the ears. Prescription sent to [facility]. Patient advised they will receive an email to purchase the medication online and have it sent to their home. Patient provided with handout reviewing treatment course and side effects and advised to call or message [place] on MyChart with any concerns.  Instructions for [facility] Medications  One or more of your medications was sent to the [facility] mail order [facility] pharmacy. You will receive an email from them and can purchase the medicine through that link. It will then be mailed to your home at the address you confirmed. If for any reason you do not receive an email from them, please check your spam folder. If you still do not find the email, please let us know. [facility] phone number is [phone].    Wound Care Instructions  Cleanse wound gently with soap and water once a day then pat dry with clean gauze. Apply a thin coat of Petrolatum (petroleum jelly, "Vaseline") over the wound (unless you have an allergy to this). We recommend that you use a new, sterile tube of Vaseline. Do not pick or remove scabs. Do not remove the yellow or white "healing tissue" from the base of the wound.  Cover the wound with fresh, clean, nonstick gauze and secure with paper tape. You may use Band-Aids in place of gauze and tape if the wound is small enough, but would recommend trimming much of the tape off as there is often too much. Sometimes Band-Aids can irritate the skin.  You should call the office for your biopsy report after 1 week if you have not already been contacted.  If you experience any problems, such as abnormal amounts of bleeding, swelling, significant bruising, significant pain, or evidence of infection,  please call the office immediately.  FOR ADULT SURGERY PATIENTS: If you need something for pain relief you may take 1 extra strength Tylenol (acetaminophen) AND 2 Ibuprofen ('200mg'$  each) together every 4 hours as needed for pain. (do not take these if you are allergic to them or if you have a reason you should not take them.) Typically, you may only need pain medication for 1 to 3 days.       Recommend taking Heliocare sun protection supplement daily in sunny weather for additional sun protection. For maximum protection on the sunniest days, you can take up to 2 capsules of regular Heliocare OR take 1 capsule of Heliocare Ultra. For prolonged exposure (such as a full day in the sun), you can repeat your dose of the supplement 4 hours after your first dose. Heliocare can be purchased at [employer], at some [facility] or at [email or web address].    Recommend daily broad spectrum sunscreen SPF 30+ to sun-exposed areas, reapply every 2 hours as needed. Call for new or changing lesions.  Staying in the shade or wearing long sleeves, sun glasses (UVA+UVB protection) and wide brim hats (4-inch brim around the entire circumference of the hat) are also recommended for sun protection.    Melanoma ABCDEs  Melanoma is the most dangerous type of skin cancer, and is the leading cause of death from skin disease.  You are more likely to develop melanoma if you: Have light-colored skin, light-colored eyes, or red or blond hair  Spend a lot of time in the sun Tan regularly, either outdoors or in a tanning bed Have had blistering sunburns, especially during childhood Have a close family member who has had a melanoma Have atypical moles or large birthmarks  Early detection of melanoma is key since treatment is typically straightforward and cure rates are extremely high if we catch it early.   The first sign of melanoma is often a change in a mole or a new dark spot.  The ABCDE system is a way of  remembering the signs of melanoma.  A for asymmetry:  The two halves do not match. B for border:  The edges of the growth are irregular. C for color:  A mixture of colors are present instead of an even brown color. D for diameter:  Melanomas are usually (but not always) greater than 62m - the size of a pencil eraser. E for evolution:  The spot keeps changing in size, shape, and color.  Please check your skin once per month between visits. You can use a small mirror in front and a large mirror behind you to keep an eye on the back side or your body.   If you see any new or changing lesions before your next follow-up, please call to schedule a visit.  Please continue daily skin protection including broad spectrum sunscreen SPF 30+ to sun-exposed areas, reapplying every 2 hours as needed when you're outdoors.   Staying in the shade or wearing long sleeves, sun glasses (UVA+UVB protection) and wide brim hats (4-inch brim around the entire circumference of the hat) are also recommended for sun protection.    Due to recent changes in healthcare laws, you may see results of your pathology and/or laboratory studies on MyChart before the doctors have had a chance to review them. We understand that in some cases there may be results that are confusing or concerning to you. Please understand that not all results are received at the same time and often the doctors may need to interpret multiple results in order to provide you with the best plan of care or course of treatment. Therefore, we ask that you please give u[place]2 business days to thoroughly review all your results before contacting the office for clarification. Should we see a critical lab result, you will be contacted sooner.   If You Need Anything After Your Visit  If you have any questions or concerns for your doctor, please call our main line at [phone] press option 4 to reach your doctor's medical assistant. If no one answers, please  leave a voicemail as directed and we will return your call as soon as possible. Messages left after 4 pm will be answered the following business day.   You may also send [place] message via [place] We typically respond to MyChart messages within 1-2 business days.  For prescription refills, please ask your pharmacy to contact our office. Our fax number is [phone]  If you have an urgent issue when the clinic is closed that cannot wait until the next business day, you can page your doctor at the number below.    Please note that while we do our best to be available for urgent issues outside of office hours, we are not available 24/7.   If you have an urgent issue and are unable to reach u[place] you may choose to seek medical care at your doctor's office, retail clinic, urgent care center, or emergency room.  If you have  a medical emergency, please immediately call 911 or go to the emergency department.  Pager Numbers  - Dr. Nehemiah Massed: [phone]  - Dr. Laurence Ferrari: [phone]  - Dr. Nicole Kindred: [phone]  In the event of inclement weather, please call our main line at [phone] for an update on the status of any delays or closures.  Dermatology Medication Tips: Please keep the boxes that topical medications come in in order to help keep track of the instructions about where and how to use these. Pharmacies typically print the medication instructions only on the boxes and not directly on the medication tubes.   If your medication is too expensive, please contact our office at [phone] option 4 or send [place] a message through [place].   We are unable to tell what your co-pay for medications will be in advance as this is different depending on your insurance coverage. However, we may be able to find a substitute medication at lower cost or fill out paperwork to get insurance to cover a needed medication.   If a prior authorization is required to get your medication covered by your insurance  company, please allow [place] 1-2 business days to complete this process.  Drug prices often vary depending on where the prescription is filled and some pharmacies may offer cheaper prices.  The website www.goodrx.com contains coupons for medications through different pharmacies. The prices here do not account for what the cost may be with help from insurance (it may be cheaper with your insurance), but the website can give you the price if you did not use any insurance.  - You can print the associated coupon and take it with your prescription to the pharmacy.  - You may also stop by our office during regular business hours and pick up a GoodRx coupon card.  - If you need your prescription sent electronically to a different pharmacy, notify our office through [facility] or by phone at [phone] option 4.     Si Usted Necesita Algo Despus de Su Visita  Tambin puede enviarnos un mensaje a travs de Pharmacist, community. Por lo general respondemos a los mensajes de MyChart en el transcurso de 1 a 2 das hbiles.  Para renovar recetas, por favor pida a su farmacia que se ponga en contacto con nuestra oficina. Harland Dingwall de fax es [place] [phone].  Si tiene un asunto urgente cuando la clnica est cerrada y que no puede esperar hasta el siguiente da hbil, puede llamar/localizar a su doctor(a) al nmero que aparece a continuacin.   Por favor, tenga en cuenta que aunque hacemos todo lo posible para estar disponibles para asuntos urgentes fuera del horario de [place], no estamos disponibles las 24 horas del da, los 7 das de la [place].   Si tiene un problema urgente y no puede comunicarse con nosotros, puede optar por buscar atencin mdica  en el consultorio de su doctor(a), en una clnica privada, en un centro de atencin urgente o en una sala de emergencias.  Si tiene Engineering geologist, por favor llame inmediatamente al 911 o vaya a la sala de emergencias.  Nmeros de bper  - Dr.  Nehemiah Massed: [phone]  - Dra. Moye: [phone]  - Dra. Nicole Kindred: [phone]  En caso de inclemencias del [place], por favor llame a Johnsie Kindred principal al [phone] para una actualizacin sobre el [place] de cualquier retraso o cierre.  Consejos para la medicacin en dermatologa: Por favor, guarde las cajas en las que vienen los medicamentos de uso tpico para  ayudarle a seguir las [employer] dnde y cmo usarlos. Las farmacias generalmente imprimen las instrucciones del medicamento slo en las cajas y no directamente en los tubos del [place].   Si su medicamento es muy caro, por favor, pngase en contacto con Zigmund Daniel llamando al [phone] y presione la opcin 4 o envenos un mensaje a travs de Pharmacist, community.   No podemos decirle cul ser su copago por los medicamentos por adelantado ya que esto es diferente dependiendo de la cobertura de su seguro. Sin embargo, es posible que podamos encontrar un medicamento sustituto a Electrical engineer un formulario para que el seguro cubra el medicamento que se considera necesario.   Si se requiere una autorizacin previa para que su compaa de seguros [place] su medicamento, por favor permtanos de 1 a 2 das hbiles para completar este proceso.  Los precios de los medicamentos varan con frecuencia dependiendo del Environmental consultant de dnde se surte la receta y alguna farmacias pueden ofrecer precios ms baratos.  El sitio web www.goodrx.com tiene cupones para medicamentos de Airline pilot. Los precios aqu no tienen en cuenta lo que podra costar con la ayuda del seguro (puede ser ms barato con su seguro), pero el sitio web puede darle el precio si no utiliz Research scientist (physical sciences).  - Puede imprimir el cupn correspondiente y llevarlo con su receta a la farmacia.  - Tambin puede pasar por nuestra oficina durante el horario de atencin regular y Charity fundraiser una tarjeta de cupones de GoodRx.  - Si necesita que su receta se enve  electrnicamente a una farmacia diferente, informe a nuestra oficina a travs de MyChart de [place] o por telfono llamando al [phone] y presione la opcin 4.

## 2022-04-10 NOTE — Progress Notes (Signed)
Follow-Up Visit   Subjective  George Schmidt is a 71 y.o. male who presents for the following: Annual Exam (HxSCC, HxSCC) and Warts (3-4 month recheck. Tx with LN2 ).  The patient presents for Total-Body Skin Exam (TBSE) for skin cancer screening and mole check.  The patient has spots, moles and lesions to be evaluated, some may be new or changing and the patient has concerns that these could be cancer.   The following portions of the chart were reviewed this encounter and updated as appropriate:  Tobacco  Allergies  Meds  Problems  Med Hx  Surg Hx  Fam Hx      Review of Systems: No other skin or systemic complaints except as noted in HPI or Assessment and Plan.   Objective  Well appearing patient in no apparent distress; mood and affect are within normal limits.  A full examination was performed including scalp, head, eyes, ears, nose, lips, neck, chest, axillae, abdomen, back, buttocks, bilateral upper extremities, bilateral lower extremities, hands, feet, fingers, toes, fingernails, and toenails. All findings within normal limits unless otherwise noted below.  Mid chest x1 Erythematous thin papules/macules with gritty scale.   B/L feet and toenals Scaling and maceration web spaces and over distal and lateral soles.   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 Mohs 06/16/2020. - No evidence of recurrence today - No lymphadenopathy - Recommend regular full body skin exams - Recommend daily broad spectrum sunscreen SPF 30+ to sun-exposed areas, reapply every 2 hours as needed.  - Call if any new or changing lesions are noted between office visits  History of Basal Cell Carcinoma of the Skin. Left lat ant shoulder, 2021; left lat upper back, left post shoulder, left upper medial back, 2022 - No evidence of recurrence  today - Recommend regular full body skin exams - Recommend daily broad spectrum sunscreen SPF 30+ to sun-exposed areas, reapply every 2 hours as needed.  - Call if any new or changing lesions are noted between office visits   Lentigines - Scattered tan macules - Due to sun exposure - Benign-appearing, observe - Recommend daily broad spectrum sunscreen SPF 30+ to sun-exposed areas, reapply every 2 hours as needed. - Call for any changes  Seborrheic Keratoses - Stuck-on, waxy, tan-brown papules and/or plaques  - Benign-appearing - Discussed benign etiology and prognosis. - Observe - Call for any changes  Melanocytic Nevi - Tan-brown and/or pink-flesh-colored symmetric macules and papules - Benign appearing on exam today - Observation - Call clinic for new or changing moles - Recommend daily use of broad spectrum spf 30+ sunscreen to sun-exposed areas.   Hemangiomas - Red papules - Discussed benign nature - Observe - Call for any changes  Actinic Damage with PreCancerous Actinic Keratoses Counseling for Topical Chemotherapy Management: Patient exhibits: - Severe, confluent actinic changes with pre-cancerous actinic keratoses that is secondary to cumulative UV radiation exposure over time - Condition that is severe; chronic, not at goal. - diffuse scaly erythematous macules and papules with underlying dyspigmentation - Discussed Prescription "Field Treatment" topical Chemotherapy for Severe, Chronic Confluent Actinic Changes with Pre-Cancerous Actinic Keratoses Field treatment involves treatment of an entire area of skin that has confluent Actinic Changes (Sun/ Ultraviolet light damage) and PreCancerous Actinic Keratoses by method of PhotoDynamic Therapy (PDT) and/or prescription Topical  Chemotherapy agents such as 5-fluorouracil, 5-fluorouracil/calcipotriene, and/or imiquimod.  The purpose is to decrease the number of clinically evident and subclinical PreCancerous lesions to  prevent progression to development of skin cancer by chemically destroying early precancer changes that may or may not be visible.  It has been shown to reduce the risk of developing skin cancer in the treated area. As a result of treatment, redness, scaling, crusting, and open sores may occur during treatment course. One or more than one of these methods may be used and may have to be used several times to control, suppress and eliminate the PreCancerous changes. Discussed treatment course, expected reaction, and possible side effects. - Recommend daily broad spectrum sunscreen SPF 30+ to sun-exposed areas, reapply every 2 hours as needed.  - Staying in the shade or wearing long sleeves, sun glasses (UVA+UVB protection) and wide brim hats (4-inch brim around the entire circumference of the hat) are also recommended. - Call for new or changing lesions.  - Start 5FU/Calcipotriene cream BID x 4 days to the entire face and BID x 7 days to the ears. Prescription sent to [facility]. Patient advised they will receive an email to purchase the medication online and have it sent to their home. Patient provided with handout reviewing treatment course and side effects and advised to call or message [place] on MyChart with any concerns.  Reviewed course of treatment and expected reaction.  Patient advised to expect inflammation and crusting and advised that erosions are possible.  Patient advised to be diligent with sun protection during and after treatment. Counseled to keep medication out of reach of children and pets.   Skin cancer screening performed today.  AK (actinic keratosis) Mid chest x1  Actinic keratoses are precancerous spots that appear secondary to cumulative UV radiation exposure/sun exposure over time. They are chronic with expected duration over 1 year. A portion of actinic keratoses will progress to squamous cell carcinoma of the skin. It is not possible to reliably predict  which spots will progress to skin cancer and so treatment is recommended to prevent development of skin cancer.  Recommend daily broad spectrum sunscreen SPF 30+ to sun-exposed areas, reapply every 2 hours as needed.  Recommend staying in the shade or wearing long sleeves, sun glasses (UVA+UVB protection) and wide brim hats (4-inch brim around the entire circumference of the hat). Call for new or changing lesions.  Destruction of lesion - Mid chest x1  Destruction method: cryotherapy   Informed consent: discussed and consent obtained   Lesion destroyed using liquid nitrogen: Yes   Region frozen until ice ball extended beyond lesion: Yes   Outcome: patient tolerated procedure well with no complications   Post-procedure details: wound care instructions given   Additional details:  Prior to procedure, discussed risks of blister formation, small wound, skin dyspigmentation, or rare scar following cryotherapy. Recommend Vaseline ointment to treated areas while healing.   Tinea pedis of both feet B/L feet and toenals  With tinea unguium   Recommend using OTC antifungal cream (terbinafine) twice daily until clear then use once a week for maintenance.   Condyloma acuminatum Pubic x1  Viral Wart (HPV) Counseling  Discussed viral / HPV (Human Papilloma Virus) etiology and risk of spread /infectivity to other areas of body as well as to other people.  Multiple treatments and methods may be required to clear warts and it is possible treatment may not be successful.  Treatment risks include discoloration; scarring and there is still  potential for wart recurrence.  Destruction of lesion - Pubic x1  Destruction method: cryotherapy   Informed consent: discussed and consent obtained   Lesion destroyed using liquid nitrogen: Yes   Region frozen until ice ball extended beyond lesion: Yes   Outcome: patient tolerated procedure well with no complications   Post-procedure details: wound care  instructions given   Additional details:  Prior to procedure, discussed risks of blister formation, small wound, skin dyspigmentation, or rare scar following cryotherapy. Recommend Vaseline ointment to treated areas while healing.   Neoplasm of skin (2) Left Upper Back  Skin / nail biopsy Type of biopsy: tangential   Informed consent: discussed and consent obtained   Anesthesia: the lesion was anesthetized in a standard fashion   Anesthesia comment:  Area prepped with alcohol Anesthetic:  1% lidocaine w/ epinephrine 1-100,000 buffered w/ 8.4% NaHCO3 Instrument used: flexible razor blade   Hemostasis achieved with: pressure, aluminum chloride and electrodesiccation   Outcome: patient tolerated procedure well   Post-procedure details: wound care instructions given   Post-procedure details comment:  Ointment and small bandage applied  Specimen 1 - Surgical pathology Differential Diagnosis: R/O BCC  Check Margins: No  Mid Back  Skin / nail biopsy Type of biopsy: tangential   Informed consent: discussed and consent obtained   Anesthesia: the lesion was anesthetized in a standard fashion   Anesthesia comment:  Area prepped with alcohol Anesthetic:  1% lidocaine w/ epinephrine 1-100,000 buffered w/ 8.4% NaHCO3 Instrument used: flexible razor blade   Hemostasis achieved with: pressure, aluminum chloride and electrodesiccation   Outcome: patient tolerated procedure well   Post-procedure details: wound care instructions given   Post-procedure details comment:  Ointment and small bandage applied  Specimen 2 - Surgical pathology Differential Diagnosis: R/O BCC  Check Margins: No   Return in about 2 months (around 06/10/2022) for Surgery for cyst on neck, suture removal and EDC 2 weeks after.  I, Emelia Salisbury, CMA, am acting as scribe for Forest Gleason, MD.  Documentation: I have reviewed the above documentation for accuracy and completeness, and I agree with the above.  Forest Gleason, MD

## 2022-04-16 ENCOUNTER — Telehealth: Payer: Self-pay

## 2022-04-16 NOTE — Telephone Encounter (Signed)
Discussed pathology results. Patient voiced understanding. [place] for recheck and [facility] 06/05/2022.

## 2022-04-16 NOTE — Telephone Encounter (Signed)
-----   Message from Alfonso Patten, MD sent at 04/16/2022 11:20 AM EST ----- 1. Skin , left upper back BASAL CELL CARCINOMA, SUPERFICIAL AND NODULAR PATTERNS 2. Skin , mid back BASAL CELL CARCINOMA, SUPERFICIAL AND NODULAR PATTERNS  ED&C at follow-up in January for both. Already scheduled.  MAs please call. Thank you!

## 2022-06-05 ENCOUNTER — Encounter: Payer: Self-pay | Admitting: Dermatology

## 2022-06-05 ENCOUNTER — Ambulatory Visit (INDEPENDENT_AMBULATORY_CARE_PROVIDER_SITE_OTHER): Payer: Medicare Other | Admitting: Dermatology

## 2022-06-05 DIAGNOSIS — L570 Actinic keratosis: Secondary | ICD-10-CM | POA: Diagnosis not present

## 2022-06-05 DIAGNOSIS — L578 Other skin changes due to chronic exposure to nonionizing radiation: Secondary | ICD-10-CM | POA: Diagnosis not present

## 2022-06-05 DIAGNOSIS — L720 Epidermal cyst: Secondary | ICD-10-CM | POA: Diagnosis not present

## 2022-06-05 DIAGNOSIS — D492 Neoplasm of unspecified behavior of bone, soft tissue, and skin: Secondary | ICD-10-CM

## 2022-06-05 DIAGNOSIS — L989 Disorder of the skin and subcutaneous tissue, unspecified: Secondary | ICD-10-CM | POA: Diagnosis not present

## 2022-06-05 MED ORDER — MUPIROCIN 2 % EX OINT: 1.0000 | TOPICAL_OINTMENT | Freq: Every day | CUTANEOUS | 0 refills | Status: DC

## 2022-06-05 MED ORDER — DOXYCYCLINE MONOHYDRATE 100 MG PO CAPS: 100.0000 mg | ORAL_CAPSULE | Freq: Two times a day (BID) | ORAL | 0 refills | Status: AC

## 2022-06-05 NOTE — Patient Instructions (Addendum)
Start doxycycline monohydrate 100 mg twice daily with food x 7 days   Doxycycline should be taken with food to prevent nausea. Do not lay down for 30 minutes after taking. Be cautious with sun exposure and use good sun protection while on this medication. Pregnant women should not take this medication.    Wound Care Instructions for After Surgery  On the day following your surgery, you should begin doing daily dressing changes until your sutures are removed: Remove the bandage. Cleanse the wound gently with soap and water.  Make sure you then dry the skin surrounding the wound completely or the tape will not stick to the skin. Do not use cotton balls on the wound. After the wound is clean and dry, apply the ointment (either prescription antibiotic prescribed by your doctor or plain Vaseline if nothing was prescribed) gently with a Q-tip. If you are using a bandaid to cover: Apply a bandaid large enough to cover the entire wound. If you do not have a bandaid large enough to cover the wound OR if you are sensitive to bandaid adhesive: Cut a non-stick pad (such as Telfa) to fit the size of the wound.  Cover the wound with the non-stick pad. If the wound is draining, you may want to add a small amount of gauze on top of the non-stick pad for a little added compression to the area. Use tape to seal the area completely.  For the next 1-2 weeks: Be sure to keep the wound moist with ointment 24/7 to ensure best healing. If you are unable to cover the wound with a bandage to hold the ointment in place, you may need to reapply the ointment several times a day. Do not bend over or lift heavy items to reduce the chance of elevated blood pressure to the wound. Do not participate in particularly strenuous activities.  Below is a list of dressing supplies you might need.  Cotton-tipped applicators - Q-tips Gauze pads (2x2 and/or 4x4) - All-Purpose Sponges New and clean tube of petroleum jelly (Vaseline)  OR prescription antibiotic ointment if prescribed Either a bandaid large enough to cover the entire wound OR non-stick dressing material (Telfa) and Tape (Paper or Hypafix)  FOR ADULT SURGERY PATIENTS: If you need something for pain relief, you may take 1 extra strength Tylenol (acetaminophen) and 2 ibuprofen (200 mg) together every 4 hours as needed. (Do not take these medications if you are allergic to them or if you know you cannot take them for any other reason). Typically you may only need pain medication for 1-3 days.   Comments on the Post-Operative Period Slight swelling and redness often appear around the wound. This is normal and will disappear within several days following the surgery. The healing wound will drain a brownish-red-yellow discharge during healing. This is a normal phase of wound healing. As the wound begins to heal, the drainage may increase in amount. Again, this drainage is normal. Notify us if the drainage becomes persistently bloody, excessively swollen, or intensely painful or develops a foul odor or red streaks.  The healing wound will also typically be itchy. This is normal. If you have severe or persistent pain, Notify us if the discomfort is severe or persistent. Avoid alcoholic beverages when taking pain medicine.  In Case of Wound Hemorrhage A wound hemorrhage is when the bandage suddenly becomes soaked with bright red blood and flows profusely. If this happens, sit down or lie down with your head elevated. If the wound  has a dressing on it, do not remove the dressing. Apply pressure to the existing gauze. If the wound is not covered, use a gauze pad to apply pressure and continue applying the pressure for 20 minutes without peeking. DO NOT COVER THE WOUND WITH A LARGE TOWEL OR [place] CLOTH. Release your hand from the wound site but do not remove the dressing. If the bleeding has stopped, gently clean around the wound. Leave the dressing in place for 24 hours if  possible. This wait time allows the blood vessels to close off so that you do not spark a new round of bleeding by disrupting the newly clotted blood vessels with an immediate dressing change. If the bleeding does not subside, continue to hold pressure for 40 minutes. If bleeding continues, page your physician, contact an After Hours clinic or go to the Emergency Room.  Due to recent changes in healthcare laws, you may see results of your pathology and/or laboratory studies on MyChart before the doctors have had a chance to review them. We understand that in some cases there may be results that are confusing or concerning to you. Please understand that not all results are received at the same time and often the doctors may need to interpret multiple results in order to provide you with the best plan of care or course of treatment. Therefore, we ask that you please give [place] 2 business days to thoroughly review all your results before contacting the office for clarification. Should we see a critical lab result, you will be contacted sooner.   If You Need Anything After Your Visit  If you have any questions or concerns for your doctor, please call our main line at [phone] and press option 4 to reach your doctor's medical assistant. If no one answers, please leave a voicemail as directed and we will return your call as soon as possible. Messages left after 4 pm will be answered the following business day.   You may also send [place] a message via [place]. We typically respond to MyChart messages within 1-2 business days.  For prescription refills, please ask your pharmacy to contact our office. Our fax number is [phone].  If you have an urgent issue when the clinic is closed that cannot wait until the next business day, you can page your doctor at the number below.    Please note that while we do our best to be available for urgent issues outside of office hours, we are not available 24/7.   If you  have an urgent issue and are unable to reach [place], you may choose to seek medical care at your doctor's office, retail clinic, urgent care center, or emergency room.  If you have a medical emergency, please immediately call 911 or go to the emergency department.  Pager Numbers  - Dr. Nehemiah Massed: [phone]  - Dr. Laurence Ferrari: [phone]  - Dr. Nicole Kindred: [phone]  In the event of inclement weather, please call our main line at [phone] for an update on the status of any delays or closures.  Dermatology Medication Tips: Please keep the boxes that topical medications come in in order to help keep track of the instructions about where and how to use these. Pharmacies typically print the medication instructions only on the boxes and not directly on the medication tubes.   If your medication is too expensive, please contact our office at [phone] option 4 or send [place] a message through [place].   We are unable to tell what  your co-pay for medications will be in advance as this is different depending on your insurance coverage. However, we may be able to find a substitute medication at lower cost or fill out paperwork to get insurance to cover a needed medication.   If a prior authorization is required to get your medication covered by your insurance company, please allow [place] 1-2 business days to complete this process.  Drug prices often vary depending on where the prescription is filled and some pharmacies may offer cheaper prices.  The website www.goodrx.com contains coupons for medications through different pharmacies. The prices here do not account for what the cost may be with help from insurance (it may be cheaper with your insurance), but the website can give you the price if you did not use any insurance.  - You can print the associated coupon and take it with your prescription to the pharmacy.  - You may also stop by our office during regular business hours and pick up a GoodRx coupon  card.  - If you need your prescription sent electronically to a different pharmacy, notify our office through [facility] or by phone at [phone] option 4.     Si Usted Necesita Algo Despus de Su Visita  Tambin puede enviarnos un mensaje a travs de Pharmacist, community. Por lo general respondemos a los mensajes de MyChart en el transcurso de 1 a 2 das hbiles.  Para renovar recetas, por favor pida a su farmacia que se ponga en contacto con nuestra oficina. Harland Dingwall de fax es [place] [phone].  Si tiene un asunto urgente cuando la clnica est cerrada y que no puede esperar hasta el siguiente da hbil, puede llamar/localizar a su doctor(a) al nmero que aparece a continuacin.   Por favor, tenga en cuenta que aunque hacemos todo lo posible para estar disponibles para asuntos urgentes fuera del horario de [place], no estamos disponibles las 24 horas del da, los 7 das de la [place].   Si tiene un problema urgente y no puede comunicarse con nosotros, puede optar por buscar atencin mdica  en el consultorio de su doctor(a), en una clnica privada, en un centro de atencin urgente o en una sala de emergencias.  Si tiene Engineering geologist, por favor llame inmediatamente al 911 o vaya a la sala de emergencias.  Nmeros de bper  - Dr. Nehemiah Massed: [phone]  - Dra. Moye: [phone]  - Dra. Nicole Kindred: [phone]  En caso de inclemencias del [place], por favor llame a Johnsie Kindred principal al [phone] para una actualizacin sobre el [place] de cualquier retraso o cierre.  Consejos para la medicacin en dermatologa: Por favor, guarde las cajas en las que vienen los medicamentos de uso tpico para ayudarle a seguir las instrucciones sobre dnde y cmo usarlos. Las farmacias generalmente imprimen las instrucciones del medicamento slo en las cajas y no directamente en los tubos del [place].   Si su medicamento es muy caro, por favor, pngase en contacto con Zigmund Daniel llamando al [phone] y presione la opcin 4 o envenos un mensaje a travs de Pharmacist, community.   No podemos decirle cul ser su copago por los medicamentos por adelantado ya que esto es diferente dependiendo de la cobertura de su seguro. Sin embargo, es posible que podamos encontrar un medicamento sustituto a Electrical engineer un formulario para que el seguro cubra el medicamento que se considera necesario.   Si se requiere una autorizacin previa para que su compaa de seguros [place] su medicamento,  por favor permtanos de 1 a 2 das hbiles para completar este proceso.  Los precios de los medicamentos varan con frecuencia dependiendo del Environmental consultant de dnde se surte la receta y alguna farmacias pueden ofrecer precios ms baratos.  El sitio web www.goodrx.com tiene cupones para medicamentos de Airline pilot. Los precios aqu no tienen en cuenta lo que podra costar con la ayuda del seguro (puede ser ms barato con su seguro), pero el sitio web puede darle el precio si no utiliz Research scientist (physical sciences).  - Puede imprimir el cupn correspondiente y llevarlo con su receta a la farmacia.  - Tambin puede pasar por nuestra oficina durante el horario de atencin regular y Charity fundraiser una tarjeta de cupones de GoodRx.  - Si necesita que su receta se enve electrnicamente a una farmacia diferente, informe a nuestra oficina a travs de MyChart de [place] o por telfono llamando al [phone] y presione la opcin 4.

## 2022-06-05 NOTE — Progress Notes (Signed)
Follow-Up Visit   Subjective  George Schmidt is a 72 y.o. male who presents for the following: Procedure (Patient here today for excision of cyst at neck. ).  Patient just finished treatment to face and ears with 5FU/calcipotriene.   The following portions of the chart were reviewed this encounter and updated as appropriate:   Tobacco  Allergies  Meds  Problems  Med Hx  Surg Hx  Fam Hx      Review of Systems:  No other skin or systemic complaints except as noted in HPI or Assessment and Plan.  Objective  Well appearing patient in no apparent distress; mood and affect are within normal limits.  A focused examination was performed including face, neck, chest and back. Relevant physical exam findings are noted in the Assessment and Plan.  posterior neck midline Subcutaneous nodule 2.2 cm, completely mobile    Assessment & Plan  Skin erosion right cheek  Neoplasm of skin posterior neck midline  Skin excision  Lesion length (cm):  2.2 Lesion width (cm):  2.2 Total excision diameter (cm):  2.2 Informed consent: discussed and consent obtained   Timeout: patient name, date of birth, surgical site, and procedure verified   Procedure prep:  Patient was prepped and draped in usual sterile fashion Prep type:  Chlorhexidine Anesthesia: the lesion was anesthetized in a standard fashion   Anesthetic:  1% lidocaine w/ epinephrine 1-100,000 buffered w/ 8.4% NaHCO3 (6 cc lido w/epi) Instrument used: #15 blade   Hemostasis achieved with: pressure and electrodesiccation    Skin repair Complexity:  Intermediate Final length (cm):  3.9 Informed consent: discussed and consent obtained   Timeout: patient name, date of birth, surgical site, and procedure verified   Procedure prep:  Patient was prepped and draped in usual sterile fashion Prep type:  Chlorhexidine Anesthesia: the lesion was anesthetized in a standard fashion   Anesthetic:  1% lidocaine w/ epinephrine 1-100,000  local infiltration Reason for type of repair: reduce tension to allow closure, reduce the risk of dehiscence, infection, and necrosis, preserve normal anatomy and preserve normal anatomical and functional relationships   Undermining: edges undermined   Subcutaneous layers (deep stitches):  Suture size:  3-0 Suture type: Vicryl (polyglactin 910)   Fine/surface layer approximation (top stitches):  Suture size:  4-0 Suture type: Prolene (polypropylene)   Stitches: simple running   Hemostasis achieved with: suture, pressure and electrodesiccation Outcome: patient tolerated procedure well with no complications   Post-procedure details: wound care instructions given   Additional details:  Mupirocin and a pressure dressing applied 3 layer repair to obliterate dead space   doxycycline (MONODOX) 100 MG capsule Take 1 capsule (100 mg total) by mouth 2 (two) times daily for 7 days. Take with food  mupirocin ointment (BACTROBAN) 2 % Apply 1 Application topically daily.  Specimen 1 - Surgical pathology Differential Diagnosis: Cyst vs Other  Check Margins: No Subcutaneous nodule 2.2 cm, completely mobile  Start doxycycline monohydrate 100 mg twice daily with food x 7 days # 14 0RF  Doxycycline should be taken with food to prevent nausea. Do not lay down for 30 minutes after taking. Be cautious with sun exposure and use good sun protection while on this medication. Pregnant women should not take this medication.     Actinic Damage with PreCancerous Actinic Keratoses Counseling for Topical Chemotherapy Management: Patient exhibits: - Severe, confluent actinic changes with pre-cancerous actinic keratoses that is secondary to cumulative UV radiation exposure over time - Condition that is severe;  chronic, not at goal. - diffuse scaly erythematous macules and papules with underlying dyspigmentation - Discussed Prescription "Field Treatment" topical Chemotherapy for Severe, Chronic Confluent  Actinic Changes with Pre-Cancerous Actinic Keratoses Field treatment involves treatment of an entire area of skin that has confluent Actinic Changes (Sun/ Ultraviolet light damage) and PreCancerous Actinic Keratoses by method of PhotoDynamic Therapy (PDT) and/or prescription Topical Chemotherapy agents such as 5-fluorouracil, 5-fluorouracil/calcipotriene, and/or imiquimod.  The purpose is to decrease the number of clinically evident and subclinical PreCancerous lesions to prevent progression to development of skin cancer by chemically destroying early precancer changes that may or may not be visible.  It has been shown to reduce the risk of developing skin cancer in the treated area. As a result of treatment, redness, scaling, crusting, and open sores may occur during treatment course. One or more than one of these methods may be used and may have to be used several times to control, suppress and eliminate the PreCancerous changes. Discussed treatment course, expected reaction, and possible side effects. - Recommend daily broad spectrum sunscreen SPF 30+ to sun-exposed areas, reapply every 2 hours as needed.  - Staying in the shade or wearing long sleeves, sun glasses (UVA+UVB protection) and wide brim hats (4-inch brim around the entire circumference of the hat) are also recommended. - Call for new or changing lesions. - s/p 5FU to face with good response. For erosion at right cheek, apply vaseline and bandage daily for a few days - Once completely healed, Repeat 5FU chemotherapy cream to left forehead twice a day x 7 days.   Return for keep follow up as scheduled 06/19/22, Suture Removal, EDC, as scheduled.  Graciella Belton, RMA, am acting as scribe for Forest Gleason, MD .  Documentation: I have reviewed the above documentation for accuracy and completeness, and I agree with the above.  Forest Gleason, MD

## 2022-06-11 ENCOUNTER — Telehealth: Payer: Self-pay

## 2022-06-11 NOTE — Telephone Encounter (Signed)
-----  Message from [place], MD sent at 06/11/2022  2:06 PM EST ----- Skin (M), posterior neck midline EPIDERMAL INCLUSION CYST --> cyst, benign. No additional treatment needed.  MAs please call. Thank you!

## 2022-06-11 NOTE — Telephone Encounter (Signed)
Patient advised pathology showed benign cyst, keep appt for suture removal. Lurlean Horns., RMA

## 2022-06-19 ENCOUNTER — Ambulatory Visit: Payer: Medicare Other | Admitting: Dermatology

## 2022-06-19 ENCOUNTER — Encounter: Payer: Self-pay | Admitting: Dermatology

## 2022-06-19 VITALS — BP 132/81 | HR 57

## 2022-06-19 DIAGNOSIS — C44519 Basal cell carcinoma of skin of other part of trunk: Secondary | ICD-10-CM | POA: Diagnosis not present

## 2022-06-19 DIAGNOSIS — L720 Epidermal cyst: Secondary | ICD-10-CM | POA: Diagnosis not present

## 2022-06-19 NOTE — Patient Instructions (Addendum)
After Suture Removal  If your medical team has placed Steri-Strips (white adhesive strips covering the surgical site to provide extra support): Keep the area dry until they fall off.  Do not peel them off. Just let them fall off on their own.  If the edges peel up, you can trim them with scissors.   If your team has not placed Steri-Strips: Wash the area daily with soap and water. Then coat the incision site with plain Vaseline and cover with a bandage. Do this daily for 5 days after the sutures are removed. After that, no additional wound care is generally needed.  However, if you would like to help fade the scar, you can apply a silicone scar cream, gel or sheet every night. The scar will remodel for one year after the procedure. If a skin cancer was removed, be sure to keep your appointment with your dermatologist for follow-up and let your dermatology team know if you have any new or changing spots between visits.    Please call our office at [phone] for any questions or concerns.Electrodesiccation and Curettage ("Scrape and Burn") Wound Care Instructions  Leave the original bandage on for 24 hours if possible.  If the bandage becomes soaked or soiled before that time, it is OK to remove it and examine the wound.  A small amount of post-operative bleeding is normal.  If excessive bleeding occurs, remove the bandage, place gauze over the site and apply continuous pressure (no peeking) over the area for 30 minutes. If this does not work, please call our clinic as soon as possible or page your doctor if it is after hours.   Once a day, cleanse the wound with soap and water. It is fine to shower. If a thick crust develops you may use a Q-tip dipped into dilute hydrogen peroxide (mix 1:1 with water) to dissolve it.  Hydrogen peroxide can slow the healing process, so use it only as needed.    After washing, apply petroleum jelly (Vaseline) or an antibiotic ointment if your doctor  prescribed one for you, followed by a bandage.    For best healing, the wound should be covered with a layer of ointment at all times. If you are not able to keep the area covered with a bandage to hold the ointment in place, this may mean re-applying the ointment several times a day.  Continue this wound care until the wound has healed and is no longer open. It may take several weeks for the wound to heal and close.  Itching and mild discomfort is normal during the healing process.  If you have any discomfort, you can take Tylenol (acetaminophen) or ibuprofen as directed on the bottle. (Please do not take these if you have an allergy to them or cannot take them for another reason).  Some redness, tenderness and white or yellow material in the wound is normal healing.  If the area becomes very sore and red, or develops a thick yellow-green material (pus), it may be infected; please notify us.    Wound healing continues for up to one year following surgery. It is not unusual to experience pain in the scar from time to time during the interval.  If the pain becomes severe or the scar thickens, you should notify the office.    A slight amount of redness in a scar is expected for the first six months.  After six months, the redness will fade and the scar  will soften and fade.  The color difference becomes less noticeable with time.  If there are any problems, return for a post-op surgery check at your earliest convenience.  To improve the appearance of the scar, you can use silicone scar gel, cream, or sheets (such as Mederma or Serica) every night for up to one year. These are available over the counter (without a prescription).  Please call our office at [phone] for any questions or concerns.  Due to recent changes in healthcare laws, you may see results of your pathology and/or laboratory studies on MyChart before the doctors have had a chance to review them. We understand that in some cases  there may be results that are confusing or concerning to you. Please understand that not all results are received at the same time and often the doctors may need to interpret multiple results in order to provide you with the best plan of care or course of treatment. Therefore, we ask that you please give [place] 2 business days to thoroughly review all your results before contacting the office for clarification. Should we see a critical lab result, you will be contacted sooner.   If You Need Anything After Your Visit  If you have any questions or concerns for your doctor, please call our main line at [phone] and press option 4 to reach your doctor's medical assistant. If no one answers, please leave a voicemail as directed and we will return your call as soon as possible. Messages left after 4 pm will be answered the following business day.   You may also send [place] a message via [place]. We typically respond to MyChart messages within 1-2 business days.  For prescription refills, please ask your pharmacy to contact our office. Our fax number is [phone].  If you have an urgent issue when the clinic is closed that cannot wait until the next business day, you can page your doctor at the number below.    Please note that while we do our best to be available for urgent issues outside of office hours, we are not available 24/7.   If you have an urgent issue and are unable to reach [place], you may choose to seek medical care at your doctor's office, retail clinic, urgent care center, or emergency room.  If you have a medical emergency, please immediately call 911 or go to the emergency department.  Pager Numbers  - Dr. Nehemiah Massed: [phone]  - Dr. Laurence Ferrari: [phone]  - Dr. Nicole Kindred: [phone]  In the event of inclement weather, please call our main line at [phone] for an update on the status of any delays or closures.  Dermatology Medication Tips: Please keep the boxes that topical  medications come in in order to help keep track of the instructions about where and how to use these. Pharmacies typically print the medication instructions only on the boxes and not directly on the medication tubes.   If your medication is too expensive, please contact our office at [phone] option 4 or send [place] a message through [place].   We are unable to tell what your co-pay for medications will be in advance as this is different depending on your insurance coverage. However, we may be able to find a substitute medication at lower cost or fill out paperwork to get insurance to cover a needed medication.   If a prior authorization is required to get your medication covered by your insurance company, please allow [place] 1-2 business days to complete this  process.  Drug prices often vary depending on where the prescription is filled and some pharmacies may offer cheaper prices.  The website www.goodrx.com contains coupons for medications through different pharmacies. The prices here do not account for what the cost may be with help from insurance (it may be cheaper with your insurance), but the website can give you the price if you did not use any insurance.  - You can print the associated coupon and take it with your prescription to the pharmacy.  - You may also stop by our office during regular business hours and pick up a GoodRx coupon card.  - If you need your prescription sent electronically to a different pharmacy, notify our office through [facility] or by phone at [phone] option 4.     Si Usted Necesita Algo Despus de Su Visita  Tambin puede enviarnos un mensaje a travs de Pharmacist, community. Por lo general respondemos a los mensajes de MyChart en el transcurso de 1 a 2 das hbiles.  Para renovar recetas, por favor pida a su farmacia que se ponga en contacto con nuestra oficina. Harland Dingwall de fax es [place] [phone].  Si tiene un asunto urgente cuando la clnica est  cerrada y que no puede esperar hasta el siguiente da hbil, puede llamar/localizar a su doctor(a) al nmero que aparece a continuacin.   Por favor, tenga en cuenta que aunque hacemos todo lo posible para estar disponibles para asuntos urgentes fuera del horario de [place], no estamos disponibles las 24 horas del da, los 7 das de la [place].   Si tiene un problema urgente y no puede comunicarse con nosotros, puede optar por buscar atencin mdica  en el consultorio de su doctor(a), en una clnica privada, en un centro de atencin urgente o en una sala de emergencias.  Si tiene Engineering geologist, por favor llame inmediatamente al 911 o vaya a la sala de emergencias.  Nmeros de bper  - Dr. Nehemiah Massed: [phone]  - Dra. Moye: [phone]  - Dra. Nicole Kindred: [phone]  En caso de inclemencias del [place], por favor llame a Johnsie Kindred principal al [phone] para una actualizacin sobre el [place] de cualquier retraso o cierre.  Consejos para la medicacin en dermatologa: Por favor, guarde las cajas en las que vienen los medicamentos de uso tpico para ayudarle a seguir las instrucciones sobre dnde y cmo usarlos. Las farmacias generalmente imprimen las instrucciones del medicamento slo en las cajas y no directamente en los tubos del [place].   Si su medicamento es muy caro, por favor, pngase en contacto con Zigmund Daniel llamando al [phone] y presione la opcin 4 o envenos un mensaje a travs de Pharmacist, community.   No podemos decirle cul ser su copago por los medicamentos por adelantado ya que esto es diferente dependiendo de la cobertura de su seguro. Sin embargo, es posible que podamos encontrar un medicamento sustituto a Electrical engineer un formulario para que el seguro cubra el medicamento que se considera necesario.   Si se requiere una autorizacin previa para que su compaa de seguros [place] su medicamento, por favor permtanos de 1 a 2 das hbiles para  completar este proceso.  Los precios de los medicamentos varan con frecuencia dependiendo del Environmental consultant de dnde se surte la receta y alguna farmacias pueden ofrecer precios ms baratos.  El sitio web www.goodrx.com tiene cupones para medicamentos de Airline pilot. Los precios aqu no tienen en cuenta lo que podra costar con la ayuda del seguro (puede  ser ms barato con su seguro), pero el sitio web puede darle el precio si no Field seismologist.  - Puede imprimir el cupn correspondiente y llevarlo con su receta a la farmacia.  - Tambin puede pasar por nuestra oficina durante el horario de atencin regular y Charity fundraiser una tarjeta de cupones de GoodRx.  - Si necesita que su receta se enve electrnicamente a una farmacia diferente, informe a nuestra oficina a travs de MyChart de [place] o por telfono llamando al [phone] y presione la opcin 4.        Due to recent changes in healthcare laws, you may see results of your pathology and/or laboratory studies on MyChart before the doctors have had a chance to review them. We understand that in some cases there may be results that are confusing or concerning to you. Please understand that not all results are received at the same time and often the doctors may need to interpret multiple results in order to provide you with the best plan of care or course of treatment. Therefore, we ask that you please give [place] 2 business days to thoroughly review all your results before contacting the office for clarification. Should we see a critical lab result, you will be contacted sooner.   If You Need Anything After Your Visit  If you have any questions or concerns for your doctor, please call our main line at [phone] and press option 4 to reach your doctor's medical assistant. If no one answers, please leave a voicemail as directed and we will return your call as soon as possible. Messages left after 4 pm will be answered the following  business day.   You may also send [place] a message via [place]. We typically respond to MyChart messages within 1-2 business days.  For prescription refills, please ask your pharmacy to contact our office. Our fax number is [phone].  If you have an urgent issue when the clinic is closed that cannot wait until the next business day, you can page your doctor at the number below.    Please note that while we do our best to be available for urgent issues outside of office hours, we are not available 24/7.   If you have an urgent issue and are unable to reach [place], you may choose to seek medical care at your doctor's office, retail clinic, urgent care center, or emergency room.  If you have a medical emergency, please immediately call 911 or go to the emergency department.  Pager Numbers  - Dr. Nehemiah Massed: [phone]  - Dr. Laurence Ferrari: [phone]  - Dr. Nicole Kindred: [phone]  In the event of inclement weather, please call our main line at [phone] for an update on the status of any delays or closures.  Dermatology Medication Tips: Please keep the boxes that topical medications come in in order to help keep track of the instructions about where and how to use these. Pharmacies typically print the medication instructions only on the boxes and not directly on the medication tubes.   If your medication is too expensive, please contact our office at [phone] option 4 or send [place] a message through [place].   We are unable to tell what your co-pay for medications will be in advance as this is different depending on your insurance coverage. However, we may be able to find a substitute medication at lower cost or fill out paperwork to get insurance to cover a needed medication.   If a prior authorization is required  to get your medication covered by your insurance company, please allow [place] 1-2 business days to complete this process.  Drug prices often vary depending on where the prescription is  filled and some pharmacies may offer cheaper prices.  The website www.goodrx.com contains coupons for medications through different pharmacies. The prices here do not account for what the cost may be with help from insurance (it may be cheaper with your insurance), but the website can give you the price if you did not use any insurance.  - You can print the associated coupon and take it with your prescription to the pharmacy.  - You may also stop by our office during regular business hours and pick up a GoodRx coupon card.  - If you need your prescription sent electronically to a different pharmacy, notify our office through [facility] or by phone at [phone] option 4.     Si Usted Necesita Algo Despus de Su Visita  Tambin puede enviarnos un mensaje a travs de Pharmacist, community. Por lo general respondemos a los mensajes de MyChart en el transcurso de 1 a 2 das hbiles.  Para renovar recetas, por favor pida a su farmacia que se ponga en contacto con nuestra oficina. Harland Dingwall de fax es [place] [phone].  Si tiene un asunto urgente cuando la clnica est cerrada y que no puede esperar hasta el siguiente da hbil, puede llamar/localizar a su doctor(a) al nmero que aparece a continuacin.   Por favor, tenga en cuenta que aunque hacemos todo lo posible para estar disponibles para asuntos urgentes fuera del horario de [place], no estamos disponibles las 24 horas del da, los 7 das de la [place].   Si tiene un problema urgente y no puede comunicarse con nosotros, puede optar por buscar atencin mdica  en el consultorio de su doctor(a), en una clnica privada, en un centro de atencin urgente o en una sala de emergencias.  Si tiene Engineering geologist, por favor llame inmediatamente al 911 o vaya a la sala de emergencias.  Nmeros de bper  - Dr. Nehemiah Massed: [phone]  - Dra. Moye: [phone]  - Dra. Nicole Kindred: [phone]  En caso de inclemencias del [place], por favor llame  a Johnsie Kindred principal al [phone] para una actualizacin sobre el [place] de cualquier retraso o cierre.  Consejos para la medicacin en dermatologa: Por favor, guarde las cajas en las que vienen los medicamentos de uso tpico para ayudarle a seguir las instrucciones sobre dnde y cmo usarlos. Las farmacias generalmente imprimen las instrucciones del medicamento slo en las cajas y no directamente en los tubos del [place].   Si su medicamento es muy caro, por favor, pngase en contacto con Zigmund Daniel llamando al [phone] y presione la opcin 4 o envenos un mensaje a travs de Pharmacist, community.   No podemos decirle cul ser su copago por los medicamentos por adelantado ya que esto es diferente dependiendo de la cobertura de su seguro. Sin embargo, es posible que podamos encontrar un medicamento sustituto a Electrical engineer un formulario para que el seguro cubra el medicamento que se considera necesario.   Si se requiere una autorizacin previa para que su compaa de seguros [place] su medicamento, por favor permtanos de 1 a 2 das hbiles para completar este proceso.  Los precios de los medicamentos varan con frecuencia dependiendo del Environmental consultant de dnde se surte la receta y alguna farmacias pueden ofrecer precios ms baratos.  El sitio web www.goodrx.com tiene cupones para medicamentos de [employer]  farmacias. Los precios aqu no tienen en cuenta lo que podra costar con la ayuda del seguro (puede ser ms barato con su seguro), pero el sitio web puede darle el precio si no utiliz Research scientist (physical sciences).  - Puede imprimir el cupn correspondiente y llevarlo con su receta a la farmacia.  - Tambin puede pasar por nuestra oficina durante el horario de atencin regular y Charity fundraiser una tarjeta de cupones de GoodRx.  - Si necesita que su receta se enve electrnicamente a una farmacia diferente, informe a nuestra oficina a travs de MyChart de [place] o por telfono llamando al [phone] y  presione la opcin 4.

## 2022-06-19 NOTE — Progress Notes (Signed)
Follow-Up Visit   Subjective  George Schmidt is a 72 y.o. male who presents for the following: Other (Patient has a spot at his chest he would like rechecked. He also reports a spot at right side of back he has been scratching he would like to have checked. ) and Suture / Staple Removal (Patient here today for suture removal at left posterior neck. He reports area).  The patient has spots, moles and lesions to be evaluated, some may be new or changing and the patient has concerns that these could be cancer.  The following portions of the chart were reviewed this encounter and updated as appropriate:  Tobacco  Allergies  Meds  Problems  Med Hx  Surg Hx  Fam Hx      Review of Systems: No other skin or systemic complaints except as noted in HPI or Assessment and Plan.   Objective  Well appearing patient in no apparent distress; mood and affect are within normal limits.  A focused examination was performed including left upper back, mid back, right flank . Relevant physical exam findings are noted in the Assessment and Plan.  Left Upper Back Pink scar  Mid Back Pink scar  Right Flank Subcutaneous nodule.    Assessment & Plan  Basal cell carcinoma (BCC) of upper back Left Upper Back  Destruction of lesion  Destruction method: electrodesiccation and curettage   Informed consent: discussed and consent obtained   Timeout:  patient name, date of birth, surgical site, and procedure verified Patient was prepped and draped in usual sterile fashion: area prepped with isopropyl alcohol. Anesthesia: the lesion was anesthetized in a standard fashion   Anesthetic:  1% lidocaine w/ epinephrine 1-100,000 buffered w/ 8.4% NaHCO3 Curettage performed in three different directions: Yes   Electrodesiccation performed over the curetted area: Yes   Curettage cycles:  3 Final wound size (cm):  1.6 Hemostasis achieved with:  electrodesiccation Outcome: patient tolerated procedure well  with no complications   Post-procedure details: wound care instructions given   Additional details:  Mupirocin and a pressure dressing applied  Accession: [id number] 04/10/2022 Bx proven : BASAL CELL CARCINOMA, SUPERFICIAL AND NODULAR PATTERNS  Treat with ED&C    Basal cell carcinoma (BCC) of back Mid Back  Destruction of lesion  Destruction method: electrodesiccation and curettage   Informed consent: discussed and consent obtained   Timeout:  patient name, date of birth, surgical site, and procedure verified Patient was prepped and draped in usual sterile fashion: area prepped with isopropyl alcohol. Anesthesia: the lesion was anesthetized in a standard fashion   Anesthetic:  1% lidocaine w/ epinephrine 1-100,000 buffered w/ 8.4% NaHCO3 Curettage performed in three different directions: Yes   Electrodesiccation performed over the curetted area: Yes   Curettage cycles:  3 Final wound size (cm):  1.9 Hemostasis achieved with:  electrodesiccation Outcome: patient tolerated procedure well with no complications   Post-procedure details: wound care instructions given   Additional details:  Mupirocin and a pressure dressing applied  Accession: [id number] 04/10/22 Bx proven BASAL CELL CARCINOMA, SUPERFICIAL AND NODULAR PATTERNS   Treat with ED&C   Epidermal inclusion cyst Right Flank  Benign-appearing. Exam most consistent with an epidermal inclusion cyst. Discussed that a cyst is a benign growth that can grow over time and sometimes get irritated or inflamed. Recommend observation if it is not bothersome. Discussed option of surgical excision to remove it if it is growing, symptomatic, or other changes noted. Please call for new or changing lesions  so they can be evaluated.     Encounter for Removal of Sutures - Incision site at the  posterior neck midline is clean, dry and intact - Wound cleansed, sutures removed, wound cleansed and steri strips applied.  - Discussed  pathology results showing epidermal inclusion cyst  - Patient advised to keep steri-strips dry until they fall off. - Scars remodel for a full year. - Once steri-strips fall off, patient can apply over-the-counter silicone scar cream each night to help with scar remodeling if desired. - Patient advised to call with any concerns or if they notice any new or changing lesions.  Return in about 3 months (around 09/17/2022) for tbse .  I, Ruthell Rummage, CMA, am acting as scribe for Forest Gleason, MD.  Documentation: I have reviewed the above documentation for accuracy and completeness, and I agree with the above.  Forest Gleason, MD

## 2022-07-02 ENCOUNTER — Encounter: Payer: Self-pay | Admitting: Dermatology

## 2022-09-11 ENCOUNTER — Other Ambulatory Visit: Payer: Self-pay | Admitting: Orthopedic Surgery

## 2022-09-13 ENCOUNTER — Inpatient Hospital Stay: Admission: RE | Admit: 2022-09-13 | Payer: Medicare Other | Source: Ambulatory Visit

## 2022-09-17 ENCOUNTER — Other Ambulatory Visit: Payer: Self-pay

## 2022-09-17 ENCOUNTER — Encounter
Admission: RE | Admit: 2022-09-17 | Discharge: 2022-09-17 | Disposition: A | Discharge status: Home / Self Care | Payer: Medicare Other | Source: Ambulatory Visit | Attending: Orthopedic Surgery | Admitting: Orthopedic Surgery

## 2022-09-17 VITALS — BP 139/79 | HR 53 | Resp 16 | Ht 69.0 in | Wt 232.4 lb

## 2022-09-17 DIAGNOSIS — Z01818 Encounter for other preprocedural examination: Secondary | ICD-10-CM | POA: Diagnosis not present

## 2022-09-17 DIAGNOSIS — Z01812 Encounter for preprocedural laboratory examination: Secondary | ICD-10-CM

## 2022-09-17 DIAGNOSIS — E119 Type 2 diabetes mellitus without complications: Secondary | ICD-10-CM | POA: Insufficient documentation

## 2022-09-17 HISTORY — DX: Sleep apnea, unspecified: G47.30

## 2022-09-17 HISTORY — DX: Chronic kidney disease, unspecified: N18.9

## 2022-09-17 HISTORY — DX: Unspecified atrial fibrillation: I48.91

## 2022-09-17 LAB — COMPREHENSIVE METABOLIC PANEL
ALT: 20 U/L (ref 0–44)
AST: 23 U/L (ref 15–41)
Albumin: 4.6 g/dL (ref 3.5–5.0)
Alkaline Phosphatase: 32 U/L — ABNORMAL LOW (ref 38–126)
Anion gap: 11 (ref 5–15)
BUN: 33 mg/dL — ABNORMAL HIGH (ref 8–23)
CO2: 24 mmol/L (ref 22–32)
Calcium: 9.7 mg/dL (ref 8.9–10.3)
Chloride: 105 mmol/L (ref 98–111)
Creatinine, Ser: 1.62 mg/dL — ABNORMAL HIGH (ref 0.61–1.24)
GFR, Estimated: 45 mL/min — ABNORMAL LOW (ref >60)
Glucose, Bld: 113 mg/dL — ABNORMAL HIGH (ref 70–99)
Potassium: 3.5 mmol/L (ref 3.5–5.1)
Sodium: 140 mmol/L (ref 135–145)
Total Bilirubin: 0.9 mg/dL (ref 0.3–1.2)
Total Protein: 7.5 g/dL (ref 6.5–8.1)

## 2022-09-17 LAB — URINALYSIS, ROUTINE W REFLEX MICROSCOPIC
Bacteria, UA: NONE SEEN
Bilirubin Urine: NEGATIVE
Glucose, UA: NEGATIVE mg/dL
Hgb urine dipstick: NEGATIVE
Ketones, ur: NEGATIVE mg/dL
Leukocytes,Ua: NEGATIVE
Nitrite: NEGATIVE
Protein, ur: 30 mg/dL — AB
Specific Gravity, Urine: 1.019 (ref 1.005–1.030)
Squamous Epithelial / HPF: NONE SEEN /HPF (ref 0–5)
pH: 5 (ref 5.0–8.0)

## 2022-09-17 LAB — TYPE AND SCREEN
ABO/RH(D): O POS
Antibody Screen: NEGATIVE

## 2022-09-17 LAB — SURGICAL PCR SCREEN
MRSA, PCR: NEGATIVE
Staphylococcus aureus: NEGATIVE

## 2022-09-17 NOTE — Patient Instructions (Addendum)
Your procedure is scheduled on: 09/26/22 - Thursday Report to the Registration Desk on the 1st floor of the [facility]. To find out your arrival time, please call [phone] between 1PM - 3PM on: 09/25/22 - Wednesday If your arrival time is 6:00 am, do not arrive before that time as the [facility] entrance doors do not open until 6:00 am.  REMEMBER: Instructions that are not followed completely may result in serious medical risk, up to and including death; or upon the discretion of your surgeon and anesthesiologist your surgery may need to be rescheduled.  Do not eat food after midnight the night before surgery.  No gum chewing or hard candies.  You may drink water up to 2 hours before you are scheduled to arrive for your surgery. Do not drink anything within 2 hours of your scheduled arrival time.  In addition, your doctor has ordered for you to drink the provided:  Gatorade G2 Drinking this carbohydrate drink up to two hours before surgery helps to reduce insulin resistance and improve patient outcomes. Please complete drinking 2 hours before scheduled arrival time.  One week prior to surgery: Stop Anti-inflammatories (NSAIDS) such as Advil, Aleve, Ibuprofen, Motrin, Naproxen, Naprosyn and Aspirin based products such as Excedrin, Goody's Powder, BC Powder.  Stop ANY OVER THE COUNTER supplements until after surgery.  You may however, continue to take Tylenol if needed for pain up until the day of surgery.  Continue taking all prescribed medications with the exception of the following:  metFORMIN (GLUCOPHAGE-XR) hold beginning 09/24/22. sildenafil (VIAGRA) hold beginning 09/24/22. Hold Eliquis beginning 09/23/22. Patient and wife advised to check with Cardiology to [facility] Eliquis stop date.  TAKE ONLY THESE MEDICATIONS THE MORNING OF SURGERY WITH A SIP OF WATER:  potassium chloride   No Alcohol for 24 hours before or after surgery.  No Smoking including e-cigarettes for 24  hours before surgery.  No chewable tobacco products for at least 6 hours before surgery.  No nicotine patches on the day of surgery.  Do not use any "recreational" drugs for at least a week (preferably 2 weeks) before your surgery.  Please be advised that the combination of cocaine and anesthesia may have negative outcomes, up to and including death. If you test positive for cocaine, your surgery will be cancelled.  On the morning of surgery brush your teeth with toothpaste and water, you may rinse your mouth with mouthwash if you wish. Do not swallow any toothpaste or mouthwash.  Use CHG Soap or wipes as directed on instruction sheet.  Do not wear jewelry, make-up, hairpins, clips or nail polish.  Do not wear lotions, powders, or perfumes.   Do not shave body hair from the neck down 48 hours before surgery.  Contact lenses, hearing aids and dentures may not be worn into surgery.  Do not bring valuables to the hospital. [facility] is not responsible for any missing/lost belongings or valuables.   Notify your doctor if there is any change in your medical condition (cold, fever, infection).  Wear comfortable clothing (specific to your surgery type) to the hospital.  After surgery, you can help prevent lung complications by doing breathing exercises.  Take deep breaths and cough every 1-2 hours. Your doctor may order a device called an Incentive Spirometer to help you take deep breaths. When coughing or sneezing, hold a pillow firmly against your incision with both hands. This is called "splinting." Doing this helps protect your incision. It also decreases belly discomfort.  If you are being admitted to the hospital overnight, leave your suitcase in the car. After surgery it may be brought to your room.  In case of increased patient census, it may be necessary for you, the patient, to continue your postoperative care in the Same Day Surgery department.  If you are being discharged the  day of surgery, you will not be allowed to drive home. You will need a responsible individual to drive you home and stay with you for 24 hours after surgery.   If you are taking public transportation, you will need to have a responsible individual with you.  Please call the Pre-admissions Testing Dept. at [phone] if you have any questions about these instructions.  Surgery Visitation Policy:  Patients having surgery or a procedure may have two visitors.  Children under the age of 61 must have an adult with them who is not the patient.  Inpatient Visitation:    Visiting hours are 7 a.m. to 8 p.m. Up to four visitors are allowed at one time in a patient room. The visitors may rotate out with other people during the day.  One visitor age 68 or older may stay with the patient overnight and must be in the room by 8 p.m. How to Use an Incentive Spirometer  An incentive spirometer is a tool that measures how well you are filling your lungs with each breath. Learning to take long, deep breaths using this tool can help you keep your lungs clear and active. This may help to reverse or lessen your chance of developing breathing (pulmonary) problems, especially infection. You may be asked to use a spirometer: After a surgery. If you have a lung problem or a history of smoking. After a long period of time when you have been unable to move or be active. If the spirometer includes an indicator to show the highest number that you have reached, your health care provider or respiratory therapist will help you set a goal. Keep a log of your progress as told by your health care provider. What are the risks? Breathing too quickly may cause dizziness or cause you to pass out. Take your time so you do not get dizzy or light-headed. If you are in pain, you may need to take pain medicine before doing incentive spirometry. It is harder to take a deep breath if you are having pain. How to use your incentive  spirometer  Sit up on the edge of your bed or on a chair. Hold the incentive spirometer so that it is in an upright position. Before you use the spirometer, breathe out normally. Place the mouthpiece in your mouth. Make sure your lips are closed tightly around it. Breathe in slowly and as deeply as you can through your mouth, causing the piston or the ball to rise toward the top of the chamber. Hold your breath for 3-5 seconds, or for as long as possible. If the spirometer includes a coach indicator, use this to guide you in breathing. Slow down your breathing if the indicator goes above the marked areas. Remove the mouthpiece from your mouth and breathe out normally. The piston or ball will return to the bottom of the chamber. Rest for a few seconds, then repeat the steps 10 or more times. Take your time and take a few normal breaths between deep breaths so that you do not get dizzy or light-headed. Do this every 1-2 hours when you are awake. If the spirometer includes a  goal marker to show the highest number you have reached (best effort), use this as a goal to work toward during each repetition. After each set of 10 deep breaths, cough a few times. This will help to make sure that your lungs are clear. If you have an incision on your chest or abdomen from surgery, place a pillow or a rolled-up towel firmly against the incision when you cough. This can help to reduce pain while taking deep breaths and coughing. General tips When you are able to get out of bed: Walk around often. Continue to take deep breaths and cough in order to clear your lungs. Keep using the incentive spirometer until your health care provider says it is okay to stop using it. If you have been in the hospital, you may be told to keep using the spirometer at home. Contact a health care provider if: You are having difficulty using the spirometer. You have trouble using the spirometer as often as instructed. Your pain  medicine is not giving enough relief for you to use the spirometer as told. You have a fever. Get help right away if: You develop shortness of breath. You develop a cough with bloody mucus from the lungs. You have fluid or blood coming from an incision site after you cough. Summary An incentive spirometer is a tool that can help you learn to take long, deep breaths to keep your lungs clear and active. You may be asked to use a spirometer after a surgery, if you have a lung problem or a history of smoking, or if you have been inactive for a long period of time. Use your incentive spirometer as instructed every 1-2 hours while you are awake. If you have an incision on your chest or abdomen, place a pillow or a rolled-up towel firmly against your incision when you cough. This will help to reduce pain. Get help right away if you have shortness of breath, you cough up bloody mucus, or blood comes from your incision when you cough. This information is not intended to replace advice given to you by your health care provider. Make sure you discuss any questions you have with your health care provider. Document Revised: 07/19/2019 Document Reviewed: 07/19/2019 Elsevier Patient Education  2023 [employer]  POLAR CARE INFORMATION  MassAdvertisement.it  How to use [facility]?  YouTube   ShippingScam.co.uk  OPERATING INSTRUCTIONS  Start the product With dry hands, connect the transformer to the electrical connection located on the top of the cooler. Next, plug the transformer into an appropriate electrical outlet. The unit will automatically start running at this point.  To stop the pump, disconnect electrical power.  Unplug to stop the product when not in use. Unplugging the Polar Care unit turns it off. Always unplug immediately after use. Never leave it plugged in while unattended. Remove pad.    FIRST ADD WATER TO FILL LINE, THEN ICE---Replace  ice when existing ice is almost melted  1 Discuss Treatment with your Licensed Health Care Practitioner and Use Only as Prescribed 2 Apply Insulation Barrier & Cold Therapy Pad 3 Check for Moisture 4 Inspect Skin Regularly  Tips and Trouble Shooting Usage Tips 1. Use cubed or chunked ice for optimal performance. 2. It is recommended to drain the Pad between uses. To drain the pad, hold the Pad upright with the hose pointed toward the ground. Depress the black plunger and allow water to drain out. 3. You may disconnect the  Pad from the unit without removing the pad from the affected area by depressing the silver tabs on the hose coupling and gently pulling the hoses apart. The Pad and unit will seal itself and will not leak. Note: Some dripping during release is normal. 4. DO NOT RUN PUMP WITHOUT WATER! The pump in this unit is designed to run with water. Running the unit without water will cause permanent damage to the pump. 5. Unplug unit before removing lid.  TROUBLESHOOTING GUIDE Pump not running, Water not flowing to the pad, Pad is not getting cold 1. Make sure the transformer is plugged into the wall outlet. 2. Confirm that the ice and water are filled to the indicated levels. 3. Make sure there are no kinks in the pad. 4. Gently pull on the blue tube to make sure the tube/pad junction is straight. 5. Remove the pad from the treatment site and ll it while the pad is lying at; then reapply. 6. Confirm that the pad couplings are securely attached to the unit. Listen for the double clicks (Figure 1) to confirm the pad couplings are securely attached.  Leaks    Note: Some condensation on the lines, controller, and pads is unavoidable, especially in warmer climates. 1. If using a [employer] Polar Care Cold Therapy unit with a detachable Cold Therapy Pad, and a leak exists (other than condensation on the lines) disconnect the pad couplings. Make sure the silver tabs on the couplings are depressed  before reconnecting the pad to the pump hose; then confirm both sides of the coupling are properly clicked in. 2. If the coupling continues to leak or a leak is detected in the pad itself, stop using it and call [employer] Customer Care at [phone].  Cleaning After use, empty and dry the unit with a soft cloth. Warm water and mild detergent may be used occasionally to clean the pump and tubes.  WARNING: The Polar Care Cube can be cold enough to cause serious injury, including full skin necrosis. Follow these Operating Instructions, and carefully read the Product Insert (see pouch on side of unit) and the Cold Therapy Pad Fitting Instructions (provided with each Cold Therapy Pad) prior to use.

## 2022-09-18 ENCOUNTER — Encounter: Payer: Medicare Other | Admitting: Dermatology

## 2022-09-25 ENCOUNTER — Encounter: Payer: Self-pay | Admitting: Orthopedic Surgery

## 2022-09-25 NOTE — Progress Notes (Signed)
Perioperative / Anesthesia Services  Pre-Admission Testing Clinical Review / Preoperative Anesthesia Consult  Date: 09/25/22  Patient Demographics:  Name: George Schmidt DOB:   1951/03/29 MRN:   [id number]  Planned Surgical Procedure(s):    Case: [id number] Date/Time: 09/26/22 0715   Procedure: TOTAL KNEE ARTHROPLASTY (Left: Knee)   Anesthesia type: Choice   Pre-op diagnosis:      Chronic pain of both knees M25.561, M25.562, G89.29     Primary osteoarthritis of both knees M17.0   Location: [facility] OR ROOM 01 / [facility]   Surgeons: Reinaldo Berber, MD     NOTE: Available PAT nursing documentation and vital signs have been reviewed. Clinical nursing staff has updated patient's PMH/PSHx, current medication list, and drug allergies/intolerances to ensure comprehensive history available to assist in medical decision making as it pertains to the aforementioned surgical procedure and anticipated anesthetic course. Extensive review of available clinical information personally performed. [facility] and PSHx updated with any diagnoses/procedures that  may have been inadvertently omitted during his intake with the pre-admission testing department's nursing staff.  Clinical Discussion:  George Schmidt is a 72 y.o. male who is submitted for pre-surgical anesthesia review and clearance prior to him undergoing the above procedure. Patient is a Former Games developer . Pertinent PMH includes: atrial fibrillation, bradycardia, RIGHT MCA stroke, LEFT cerebellar stroke, diastolic dysfunction, ascending aorta dilatation, HTN, HLD, T2DM, CKD-III, GERD (on CaCO3 tablets PRN), OSAH (no nocturnal PAP therapy), ED (on PDE5i), OA.  Patient is followed by cardiology Juliann Pares, MD). He was last seen in the cardiology clinic on 09/18/2022; notes reviewed. At the time of his clinic visit, patient doing well overall from a cardiovascular perspective.  Patient's only complaint was pain in his LEFT knee  and some intermittent position related vertiginous symptoms. Patient denied any chest pain, shortness of breath, PND, orthopnea, palpitations, significant peripheral edema, weakness, fatigue, or presyncope/syncope. Patient with a past medical history significant for cardiovascular diagnoses. Documented physical exam was grossly benign, providing no evidence of acute exacerbation and/or decompensation of the patient's known cardiovascular conditions.  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  Neurological event did not result in any lasting deficits per patient report.  TTE performed on 12/01/2019 revealed a normal left ventricular systolic function with an EF of 55 to 60%.  There were no regional wall motion abnormalities.  Right ventricular size and function was normal.  There was no significant valvular regurgitation.  All transvalvular gradients were noted to be normal providing no evidence suggestive of valvular stenosis.  Aortic root was noted to be mildly dilated at 39 mm.  Myocardial perfusion imaging study was performed on 04/12/2020 revealing a low normal left ventricular systolic function with an LVEF of 51%.  There were no regional wall motion abnormalities.  There was no evidence of stress-induced myocardial ischemia or arrhythmia; no scintigraphic evidence of scar.  Study determined to be normal and low risk.  CT imaging and brain MRI performed on 07/18/2021 revealed an acute or subacute RIGHT anterior MCA territory infarct.  There was mild mass effect with a 3 mm leftward midline shift noted.  Patient recovered fully with no residual deficits from this neurological event.  Echocardiogram with bubble study performed on 07/19/2021 revealed normal left ventricular systolic function with an EF of 50-55%.  There were no regional wall motion abnormalities.  Left ventricular diastolic Doppler parameters consistent  with pseudonormalization (G2DD).  Right ventricular size and function was normal.  No valvular regurgitation.  All transvalvular gradients were normal, again suggesting no evidence of stenosis.  Aortic root and ascending aorta dilatation noted measuring up to 40 mm.  Agitated saline contrast bubble study was negative with no evidence of any intra-atrial shunting.  Patient with an atrial fibrillation diagnosis; CHA2DS2-VASc Score = 5 (age, HTN, CVA x 2, T2DM). His rate and rhythm are currently being maintained on oral beta-blocker (atenolol). He is chronically anticoagulated using apixaban; reported to be compliant with therapy with no evidence or reports of GI bleeding.  Blood pressure reasonably controlled at 130/80 mmHg on currently prescribed CCB (amlodipine), beta-blocker (atenolol), and diuretic (chlorthalidone) therapies. He is on atorvastatin + fenofibrate therapy for his HLD diagnosis and further ASCVD prevention. T2DM well controlled on currently prescribed regimen; last HgbA1c was 6.2% when checked on 08/30/2022. Functional capacity is somewhat limited by patient's current knee pain.  With that being said, patient still felt to be able to achieve at least 4 METS of physical activity without experiencing any degree of significant angina/anginal equivalent symptoms.  No changes were made to his medication regimen.  Patient follow-up with outpatient cardiology in 6 weeks or sooner if needed.  George Schmidt is scheduled for an elective TOTAL KNEE ARTHROPLASTY (Left: Knee) on 09/26/2022 with Dr. Reinaldo Berber, MD.  Given patient's past medical history significant for cardiovascular diagnoses, presurgical cardiac clearance was sought by the PAT team. Per cardiology, "this patient is optimized for surgery and may proceed with the planned procedural course with a ACCEPTABLE risk of significant perioperative cardiovascular complications".    Again,  this patient is on daily oral anticoagulation therapy. He has been instructed on recommendations for holding his apixaban for 2 days prior to his procedure with plans to restart as soon as postoperative bleeding risk felt to be minimized by his attending surgeon. The patient has been instructed that his last dose of his apixaban should be on 09/23/2022.  Patient denies previous perioperative complications with anesthesia in the past. In review of the available records, it is noted that patient underwent a MAC anesthetic course at [facility] ([facility]) in 03/2022 without documented complications.      09/17/2022   12:56 PM 06/19/2022    1:00 PM 04/03/2022    8:29 AM  Vitals with BMI  Height 5\' 9"     Weight 232 lbs 6 oz    BMI 34.3    Systolic 139 132 161  Diastolic 79 81 73  Pulse 53 57 61    Providers/Specialists:   NOTE: Primary physician provider listed below. Patient may have been seen by APP or partner within same practice.   PROVIDER ROLE / SPECIALTY LAST Wilber Bihari, MD Orthopedics (Surgeon) 09/20/2022  Jerl Mina, MD Primary Care Provider 01/22/2022  Rudean Hitt, MD Cardiology 09/18/2022   Allergies:  Amoxicillin, Clarithromycin, and Propranolol  Current Home Medications:   No current facility-administered medications for this encounter.    amLODipine (NORVASC) 10 MG tablet   apixaban (ELIQUIS) 5 MG TABS tablet   atenolol-chlorthalidone (TENORETIC) 50-25 MG per tablet   atorvastatin (LIPITOR) 40 MG tablet   calcium carbonate (TUMS - DOSED IN MG ELEMENTAL CALCIUM) 500 MG chewable tablet   fenofibrate 160 MG tablet   ibuprofen (ADVIL) 200 MG tablet   ketoconazole (NIZORAL) 2 % cream   metFORMIN (GLUCOPHAGE-XR) 500 MG 24 hr tablet   Multiple Vitamins-Minerals (PRESERVISION AREDS PO)   mupirocin ointment (  BACTROBAN) 2 %   potassium chloride SA (K-DUR,KLOR-CON) 20 MEQ tablet   sildenafil (VIAGRA) 100 MG tablet   History:   Past Medical  History:  Diagnosis Date   A-fib (HCC)    a.) CHA2DS2VASc = 5 (age, HTN, CVA x 2, T2DM);  b.) rate/rhythm maintained on oral beta blocker (atenolol); chronically anticoagulated with apixaban   Acute ischemic right middle cerebral artery (MCA) stroke (HCC) 07/18/2021   a.) CT head and MRI brain 07/18/2021: acute or subacute RIGHT anterior MCA territory infarct. 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 shoulder latera. Superficial and nodular.    Basal cell carcinoma 05/25/2020   L upper back lateral - ED&C   Basal cell carcinoma 05/25/2020   L shoulder posterior adjacent to white patch - ED&C   Basal cell carcinoma 05/25/2020   L upper medial back - ED&C   Basal cell carcinoma 04/10/2022   Left upper back. Superficial and nodular. EDC done 06/19/22   Basal cell carcinoma 04/10/2022   Mid back. Superficial and nodular. EDC done 06/19/22   Bradycardia    CKD (chronic kidney disease), stage III (HCC)    Dental bridge present    permanent, bottom   Diastolic dysfunction    a.) TTE 07/19/2021: EF 50-55%, no RWMAs, G2DD   Diverticulosis    Erectile dysfunction    a.) on PDE5i (sildenafil)   GERD (gastroesophageal reflux disease)    Headache    Hemorrhoids    History of bilateral cataract extraction 03/2022   Hyperlipidemia    Hypertension    Long term current use of anticoagulant    a.) apixaban   Sleep apnea    a.) not using nocturnal PAP therapy   Squamous cell carcinoma of skin 04/27/2020   R temple - MOHS 06/16/20 at [facility]   T2DM (type 2 diabetes mellitus) [facility])    Past  Surgical History:  Procedure Laterality Date   CATARACT EXTRACTION W/PHACO Left 03/20/2022   Procedure: CATARACT EXTRACTION PHACO AND INTRAOCULAR LENS PLACEMENT (IOC) LEFT DIABETIC;  Surgeon: Lockie Mola, MD;  Location: [facility];  Service: Ophthalmology;  Laterality: Left;  Diabetic 15.12 01:39.2   CATARACT EXTRACTION W/PHACO Right 04/03/2022   Procedure: CATARACT EXTRACTION PHACO AND INTRAOCULAR LENS PLACEMENT (IOC) RIGHT DIABETIC;  Surgeon: Lockie Mola, MD;  Location: [facility];  Service: Ophthalmology;  Laterality: Right;  Diabetic 8.89 01:08.9   COLONOSCOPY WITH PROPOFOL N/A 10/21/2014   Procedure: COLONOSCOPY WITH PROPOFOL;  Surgeon: Elnita Maxwell, MD;  Location: [facility] ENDOSCOPY;  Service: Endoscopy;  Laterality: N/A;   Family History  Problem Relation Age of Onset   Heart attack Mother    Clotting disorder Father    Social History   Tobacco Use   Smoking status: Former   Smokeless tobacco: Never  Building services engineer Use: Never used  Substance Use Topics   Alcohol use: Yes    Alcohol/week: 1.0 standard drink of alcohol    Types: 1 Cans of beer per week    Comment: rare   Drug  use: No    Pertinent Clinical Results:  LABS:   No visits with results within 3 Day(s) from this visit.  Latest known visit with results is:  Hospital Outpatient Visit on 09/17/2022  Component Date Value Ref Range Status   MRSA, PCR 09/17/2022 NEGATIVE  NEGATIVE Final   Staphylococcus aureus 09/17/2022 NEGATIVE  NEGATIVE Final   Comment: (NOTE) The Xpert SA Assay (FDA approved for NASAL specimens in patients 87 years of age and older), is one component of a comprehensive surveillance program. It is not intended to diagnose infection nor to guide or monitor treatment. Performed at [facility], [address] [place], [place] [address]    Sodium 09/17/2022 140  135 - 145 mmol/L Final   Potassium 09/17/2022 3.5  3.5 - 5.1 mmol/L Final    Chloride 09/17/2022 105  98 - 111 mmol/L Final   CO2 09/17/2022 24  22 - 32 mmol/L Final   Glucose, Bld 09/17/2022 113 (H)  70 - 99 mg/dL Final   Glucose reference range applies only to samples taken after fasting for at least 8 hours.   BUN 09/17/2022 33 (H)  8 - 23 mg/dL Final   Creatinine, Ser 09/17/2022 1.62 (H)  0.61 - 1.24 mg/dL Final   Calcium 60/45/4098 9.7  8.9 - 10.3 mg/dL Final   Total Protein 11/91/4782 7.5  6.5 - 8.1 g/dL Final   Albumin 95/62/1308 4.6  3.5 - 5.0 g/dL Final   AST 65/78/4696 23  15 - 41 U/L Final   ALT 09/17/2022 20  0 - 44 U/L Final   Alkaline Phosphatase 09/17/2022 32 (L)  38 - 126 U/L Final   Total Bilirubin 09/17/2022 0.9  0.3 - 1.2 mg/dL Final   GFR, Estimated 09/17/2022 45 (L)  >60 mL/min Final   Comment: (NOTE) Calculated using the CKD-EPI Creatinine Equation (2021)    Anion gap 09/17/2022 11  5 - 15 Final   Performed at [facility], [address] [place], [place] [address]   Color, Urine 09/17/2022 YELLOW (A)  YELLOW Final   APPearance 09/17/2022 CLEAR (A)  CLEAR Final   Specific Gravity, Urine 09/17/2022 1.019  1.005 - 1.030 Final   pH 09/17/2022 5.0  5.0 - 8.0 Final   Glucose, UA 09/17/2022 NEGATIVE  NEGATIVE mg/dL Final   Hgb urine dipstick 09/17/2022 NEGATIVE  NEGATIVE Final   Bilirubin Urine 09/17/2022 NEGATIVE  NEGATIVE Final   Ketones, ur 09/17/2022 NEGATIVE  NEGATIVE mg/dL Final   Protein, ur 41/32/4401 30 (A)  NEGATIVE mg/dL Final   Nitrite 02/72/5366 NEGATIVE  NEGATIVE Final   Leukocytes,Ua 09/17/2022 NEGATIVE  NEGATIVE Final   RBC / HPF 09/17/2022 0-5  0 - 5 RBC/hpf Final   WBC, UA 09/17/2022 0-5  0 - 5 WBC/hpf Final   Bacteria, UA 09/17/2022 NONE SEEN  NONE SEEN Final   Squamous Epithelial / HPF 09/17/2022 NONE SEEN  0 - 5 /HPF Final   Mucus 09/17/2022 PRESENT   Final   Performed at [facility], [address] [place] [place], [place] [address]   ABO/RH(D) 09/17/2022 O POS   Final   Antibody Screen 09/17/2022 NEG    Final   Sample Expiration 09/17/2022 10/01/2022,2359   Final   Extend sample reason 09/17/2022    Final                   Value:NO TRANSFUSIONS OR PREGNANCY IN THE PAST 3 MONTHS Performed at [facility], [address] [place], [place]  [address]     ECG: Date: 09/25/2022 Time ECG obtained: 1420 PM Rate: 48 bpm Rhythm: Sinus bradycardia with first-degree AV block Axis (leads I and aVF): Normal Intervals: PR 230 ms. QRS 100 ms. QTc 466 ms. ST segment and T wave changes: No evidence of acute ST segment elevation or depression.  Evidence of an age undetermined inferior infarct present. Comparison: S previous tracing obtained on 07/20/2021 showed atrial fibrillation with RVR at a rate of 106 bpm.   IMAGING / PROCEDURES: CT HEAD WO CONTRAST performed on 07/20/2021 No significant interval change in subacute infarction of the right MCA territory with surrounding edema and approximately 2 mm right-to-left midline shift. Chronic left inferior cerebellar infarct, unchanged.   ECHOCARDIOGRAM COMPLETE BUBBLE STUDY performed on 07/19/2021 Left ventricular ejection fraction, by estimation, is 50 to 55%. The left ventricle has low normal function. The left ventricle has no regional wall motion abnormalities. Left ventricular diastolic parameters are consistent with Grade II diastolic dysfunction (pseudonormalization).  Right ventricular systolic function is normal. The right ventricular size is normal. There is normal pulmonary artery systolic pressure. The mitral valve is normal in structure. No evidence of mitral valve regurgitation. No evidence of mitral stenosis.  The aortic valve is normal in structure. Aortic valve regurgitation is not visualized. No aortic stenosis is present.  There is mild dilatation of the aortic root and of the ascending aorta, measuring 40 mm.  The inferior vena cava is normal in size with greater than 50% respiratory variability, suggesting right atrial pressure  of 3 mmHg.  Agitated saline contrast bubble study was negative, with no evidence of any interatrial shunt.   MYOCARDIAL PERFUSION IMAGING STUDY (LEXISCAN) performed on 04/13/2020 Low normal left ventricular systolic function with a normal LVEF of 51% Normal myocardial thickening and wall motion Left ventricular cavity size normal SPECT images demonstrate homogenous tracer distribution throughout the myocardium No evidence of stress-induced myocardial ischemia or arrhythmia Normal low risk study  Impression and Plan:  George Schmidt has been referred for pre-anesthesia review and clearance prior to him undergoing the planned anesthetic and procedural courses. Available labs, pertinent testing, and imaging results were personally reviewed by me in preparation for upcoming operative/procedural course. [facility] medical record has been updated following extensive record review and patient interview with PAT staff.   This patient has been appropriately cleared by cardiology with an overall ACCEPTABLE risk of significant perioperative cardiovascular complications. Based on clinical review performed today (09/25/22), barring any significant acute changes in the patient's overall condition, it is anticipated that he will be able to proceed with the planned surgical intervention. Any acute changes in clinical condition may necessitate his procedure being postponed and/or cancelled. Patient will meet with anesthesia team (MD and/or CRNA) on the day of his procedure for preoperative evaluation/assessment. Questions regarding anesthetic course will be fielded at that time.   Pre-surgical instructions were reviewed with the patient during his PAT appointment, and questions were fielded to satisfaction by PAT clinical staff. He has been instructed on which medications that he will need to hold prior to surgery, as well as the ones that have been deemed safe/appropriate to take on the day of his procedure. As  part of the general education provided by PAT, patient made aware both verbally and in writing, that he would need to abstain from the use of any illegal substances during his perioperative course.  He was advised that failure to follow the provided instructions could necessitate case cancellation or result in serious perioperative  complications up to and including death. Patient encouraged to contact PAT and/or his surgeon's office to discuss any questions or concerns that may arise prior to surgery; verbalized understanding.   Quentin Mulling, MSN, APRN, FNP-C, CEN [facility]  Peri-operative Services Nurse Practitioner Phone: [phone] Fax: [phone] 09/25/22 1:55 PM  NOTE: This note has been prepared using Dragon dictation software. Despite my best ability to proofread, there is always the potential that unintentional transcriptional errors may still occur from this process.

## 2022-09-26 ENCOUNTER — Other Ambulatory Visit: Payer: Self-pay

## 2022-09-26 ENCOUNTER — Observation Stay
Admission: RE | Admit: 2022-09-26 | Discharge: 2022-09-27 | Disposition: A | Discharge status: Home / Self Care | Payer: Medicare Other | Attending: Orthopedic Surgery | Admitting: Orthopedic Surgery

## 2022-09-26 ENCOUNTER — Encounter
Admission: RE | Disposition: A | Discharge status: Home / Self Care | Payer: Self-pay | Source: Home / Self Care | Attending: Orthopedic Surgery

## 2022-09-26 ENCOUNTER — Ambulatory Visit: Payer: Medicare Other | Admitting: Urgent Care

## 2022-09-26 ENCOUNTER — Encounter: Payer: Self-pay | Admitting: Orthopedic Surgery

## 2022-09-26 ENCOUNTER — Ambulatory Visit: Payer: Medicare Other

## 2022-09-26 DIAGNOSIS — M1712 Unilateral primary osteoarthritis, left knee: Secondary | ICD-10-CM | POA: Diagnosis not present

## 2022-09-26 DIAGNOSIS — Z7984 Long term (current) use of oral hypoglycemic drugs: Secondary | ICD-10-CM | POA: Diagnosis not present

## 2022-09-26 DIAGNOSIS — I129 Hypertensive chronic kidney disease with stage 1 through stage 4 chronic kidney disease, or unspecified chronic kidney disease: Secondary | ICD-10-CM | POA: Diagnosis not present

## 2022-09-26 DIAGNOSIS — Z8673 Personal history of transient ischemic attack (TIA), and cerebral infarction without residual deficits: Secondary | ICD-10-CM | POA: Diagnosis not present

## 2022-09-26 DIAGNOSIS — N1832 Chronic kidney disease, stage 3b: Secondary | ICD-10-CM | POA: Diagnosis not present

## 2022-09-26 DIAGNOSIS — E119 Type 2 diabetes mellitus without complications: Secondary | ICD-10-CM

## 2022-09-26 DIAGNOSIS — Z7901 Long term (current) use of anticoagulants: Secondary | ICD-10-CM | POA: Insufficient documentation

## 2022-09-26 DIAGNOSIS — Z79899 Other long term (current) drug therapy: Secondary | ICD-10-CM | POA: Insufficient documentation

## 2022-09-26 DIAGNOSIS — E1122 Type 2 diabetes mellitus with diabetic chronic kidney disease: Secondary | ICD-10-CM | POA: Diagnosis not present

## 2022-09-26 DIAGNOSIS — Z85828 Personal history of other malignant neoplasm of skin: Secondary | ICD-10-CM | POA: Diagnosis not present

## 2022-09-26 DIAGNOSIS — Z8616 Personal history of COVID-19: Secondary | ICD-10-CM | POA: Insufficient documentation

## 2022-09-26 DIAGNOSIS — Z96652 Presence of left artificial knee joint: Secondary | ICD-10-CM

## 2022-09-26 DIAGNOSIS — Z01812 Encounter for preprocedural laboratory examination: Secondary | ICD-10-CM

## 2022-09-26 DIAGNOSIS — I4891 Unspecified atrial fibrillation: Secondary | ICD-10-CM | POA: Diagnosis not present

## 2022-09-26 HISTORY — DX: Male erectile dysfunction, unspecified: N52.9

## 2022-09-26 HISTORY — DX: Gastro-esophageal reflux disease without esophagitis: K21.9

## 2022-09-26 HISTORY — DX: Chronic kidney disease, stage 3 unspecified: N18.30

## 2022-09-26 HISTORY — DX: Bradycardia, unspecified: R00.1

## 2022-09-26 HISTORY — DX: Diverticulosis of intestine, part unspecified, without perforation or abscess without bleeding: K57.90

## 2022-09-26 HISTORY — DX: Thoracic aortic ectasia: I77.810

## 2022-09-26 HISTORY — DX: Other ill-defined heart diseases: I51.89

## 2022-09-26 HISTORY — PX: TOTAL KNEE ARTHROPLASTY: SHX125

## 2022-09-26 HISTORY — DX: Type 2 diabetes mellitus without complications: E11.9

## 2022-09-26 HISTORY — DX: Long term (current) use of anticoagulants: Z79.01

## 2022-09-26 LAB — GLUCOSE, CAPILLARY
Glucose-Capillary: 139 mg/dL — ABNORMAL HIGH (ref 70–99)
Glucose-Capillary: 124 mg/dL — ABNORMAL HIGH (ref 70–99)
Glucose-Capillary: 170 mg/dL — ABNORMAL HIGH (ref 70–99)
Glucose-Capillary: 221 mg/dL — ABNORMAL HIGH (ref 70–99)
Glucose-Capillary: 193 mg/dL — ABNORMAL HIGH (ref 70–99)

## 2022-09-26 LAB — ABO/RH: ABO/RH(D): O POS

## 2022-09-26 SURGERY — ARTHROPLASTY, KNEE, TOTAL
Anesthesia: Spinal | Site: Knee | Laterality: Left

## 2022-09-26 MED ORDER — ATORVASTATIN CALCIUM 20 MG PO TABS
40.0000 mg | ORAL_TABLET | Freq: Every day | ORAL | Status: DC
  Administered 2022-09-27: 40 mg via ORAL

## 2022-09-26 MED ORDER — INSULIN ASPART 100 UNIT/ML IJ SOLN
INTRAMUSCULAR | Status: AC
  Filled 2022-09-26: qty 1

## 2022-09-26 MED ORDER — CEFAZOLIN SODIUM-DEXTROSE 2-4 GM/100ML-% IV SOLN
2.0000 g | INTRAVENOUS | Status: AC
  Administered 2022-09-26: 2 g via INTRAVENOUS

## 2022-09-26 MED ORDER — TRAMADOL HCL 50 MG PO TABS
ORAL_TABLET | ORAL | Status: AC
  Filled 2022-09-26: qty 1

## 2022-09-26 MED ORDER — ACETAMINOPHEN 500 MG PO TABS
ORAL_TABLET | ORAL | Status: AC
  Filled 2022-09-26: qty 2

## 2022-09-26 MED ORDER — DOCUSATE SODIUM 100 MG PO CAPS
100.0000 mg | ORAL_CAPSULE | Freq: Two times a day (BID) | ORAL | Status: DC
  Administered 2022-09-26 – 2022-09-27 (×2): 100 mg via ORAL

## 2022-09-26 MED ORDER — DOCUSATE SODIUM 100 MG PO CAPS
ORAL_CAPSULE | ORAL | Status: AC
  Filled 2022-09-26: qty 1

## 2022-09-26 MED ORDER — HYDROCODONE-ACETAMINOPHEN 5-325 MG PO TABS
1.0000 | ORAL_TABLET | ORAL | Status: DC | PRN
  Administered 2022-09-26 – 2022-09-27 (×3): 2 via ORAL

## 2022-09-26 MED ORDER — CHLORHEXIDINE GLUCONATE 0.12 % MT SOLN
OROMUCOSAL | Status: AC
  Filled 2022-09-26: qty 15

## 2022-09-26 MED ORDER — FENOFIBRATE 160 MG PO TABS
160.0000 mg | ORAL_TABLET | Freq: Every day | ORAL | Status: DC
  Administered 2022-09-27: 160 mg via ORAL
  Filled 2022-09-26 (×2): qty 1

## 2022-09-26 MED ORDER — OXYCODONE HCL 5 MG PO TABS: 5.0000 mg | ORAL_TABLET | Freq: Once | ORAL | Status: DC | PRN

## 2022-09-26 MED ORDER — FENTANYL CITRATE (PF) 100 MCG/2ML IJ SOLN: 25.0000 ug | INTRAMUSCULAR | Status: DC | PRN

## 2022-09-26 MED ORDER — ACETAMINOPHEN 500 MG PO TABS
1000.0000 mg | ORAL_TABLET | Freq: Three times a day (TID) | ORAL | Status: DC
  Administered 2022-09-26 (×2): 1000 mg via ORAL

## 2022-09-26 MED ORDER — PROPOFOL 10 MG/ML IV BOLUS
INTRAVENOUS | Status: AC
  Filled 2022-09-26: qty 20

## 2022-09-26 MED ORDER — PHENOL 1.4 % MT LIQD: 1.0000 | OROMUCOSAL | Status: DC | PRN

## 2022-09-26 MED ORDER — DEXAMETHASONE SODIUM PHOSPHATE 10 MG/ML IJ SOLN
INTRAMUSCULAR | Status: AC
  Filled 2022-09-26: qty 1

## 2022-09-26 MED ORDER — ONDANSETRON HCL 4 MG/2ML IJ SOLN: 4.0000 mg | Freq: Four times a day (QID) | INTRAMUSCULAR | Status: DC | PRN

## 2022-09-26 MED ORDER — PHENYLEPHRINE HCL-NACL 20-0.9 MG/250ML-% IV SOLN
INTRAVENOUS | Status: DC | PRN
  Administered 2022-09-26: 40 ug/min via INTRAVENOUS

## 2022-09-26 MED ORDER — ORAL CARE MOUTH RINSE: 15.0000 mL | Freq: Once | OROMUCOSAL | Status: AC

## 2022-09-26 MED ORDER — BUPIVACAINE HCL (PF) 0.5 % IJ SOLN
INTRAMUSCULAR | Status: DC | PRN
  Administered 2022-09-26: 2.5 mL

## 2022-09-26 MED ORDER — INSULIN ASPART 100 UNIT/ML IJ SOLN
0.0000 [IU] | Freq: Three times a day (TID) | INTRAMUSCULAR | Status: DC
  Administered 2022-09-26: 3 [IU] via SUBCUTANEOUS
  Administered 2022-09-26: 5 [IU] via SUBCUTANEOUS
  Administered 2022-09-27: 2 [IU] via SUBCUTANEOUS

## 2022-09-26 MED ORDER — PROPOFOL 1000 MG/100ML IV EMUL
INTRAVENOUS | Status: AC
  Filled 2022-09-26: qty 100

## 2022-09-26 MED ORDER — EPINEPHRINE PF 1 MG/ML IJ SOLN
INTRAMUSCULAR | Status: AC
  Filled 2022-09-26: qty 2

## 2022-09-26 MED ORDER — FENTANYL CITRATE (PF) 100 MCG/2ML IJ SOLN
INTRAMUSCULAR | Status: DC | PRN
  Administered 2022-09-26: 25 ug via INTRAVENOUS

## 2022-09-26 MED ORDER — TRAMADOL HCL 50 MG PO TABS
50.0000 mg | ORAL_TABLET | Freq: Four times a day (QID) | ORAL | Status: DC | PRN
  Administered 2022-09-26: 50 mg via ORAL

## 2022-09-26 MED ORDER — SODIUM CHLORIDE 0.9 % IV SOLN: INTRAVENOUS | Status: DC

## 2022-09-26 MED ORDER — METOCLOPRAMIDE HCL 10 MG PO TABS: 5.0000 mg | ORAL_TABLET | Freq: Three times a day (TID) | ORAL | Status: DC | PRN

## 2022-09-26 MED ORDER — SODIUM CHLORIDE (PF) 0.9 % IJ SOLN
INTRAMUSCULAR | Status: DC | PRN
  Administered 2022-09-26: 70 mL via INTRAMUSCULAR

## 2022-09-26 MED ORDER — ONDANSETRON HCL 4 MG PO TABS: 4.0000 mg | ORAL_TABLET | Freq: Four times a day (QID) | ORAL | Status: DC | PRN

## 2022-09-26 MED ORDER — MENTHOL 3 MG MT LOZG: 1.0000 | LOZENGE | OROMUCOSAL | Status: DC | PRN

## 2022-09-26 MED ORDER — CEFAZOLIN SODIUM-DEXTROSE 2-4 GM/100ML-% IV SOLN
INTRAVENOUS | Status: AC
  Filled 2022-09-26: qty 100

## 2022-09-26 MED ORDER — TRANEXAMIC ACID-NACL 1000-0.7 MG/100ML-% IV SOLN
INTRAVENOUS | Status: AC
  Filled 2022-09-26: qty 100

## 2022-09-26 MED ORDER — ATENOLOL-CHLORTHALIDONE 50-25 MG PO TABS: 1.0000 | ORAL_TABLET | Freq: Every day | ORAL | Status: DC

## 2022-09-26 MED ORDER — PHENYLEPHRINE 80 MCG/ML (10ML) SYRINGE FOR IV PUSH (FOR BLOOD PRESSURE SUPPORT)
PREFILLED_SYRINGE | INTRAVENOUS | Status: AC
  Filled 2022-09-26: qty 10

## 2022-09-26 MED ORDER — OXYCODONE HCL 5 MG/5ML PO SOLN: 5.0000 mg | Freq: Once | ORAL | Status: DC | PRN

## 2022-09-26 MED ORDER — CHLORTHALIDONE 25 MG PO TABS
25.0000 mg | ORAL_TABLET | Freq: Every day | ORAL | Status: DC
  Administered 2022-09-27: 25 mg via ORAL
  Filled 2022-09-26: qty 1

## 2022-09-26 MED ORDER — SODIUM CHLORIDE 0.9 % IR SOLN
Status: DC | PRN
  Administered 2022-09-26: 3000 mL

## 2022-09-26 MED ORDER — ONDANSETRON HCL 4 MG/2ML IJ SOLN: 4.0000 mg | Freq: Once | INTRAMUSCULAR | Status: DC | PRN

## 2022-09-26 MED ORDER — BUPIVACAINE HCL (PF) 0.25 % IJ SOLN
INTRAMUSCULAR | Status: AC
  Filled 2022-09-26: qty 60

## 2022-09-26 MED ORDER — INSULIN ASPART 100 UNIT/ML IJ SOLN: 0.0000 [IU] | Freq: Every day | INTRAMUSCULAR | Status: DC

## 2022-09-26 MED ORDER — PHENYLEPHRINE HCL-NACL 20-0.9 MG/250ML-% IV SOLN
INTRAVENOUS | Status: AC
  Filled 2022-09-26: qty 250

## 2022-09-26 MED ORDER — CHLORHEXIDINE GLUCONATE 0.12 % MT SOLN
15.0000 mL | Freq: Once | OROMUCOSAL | Status: AC
  Administered 2022-09-26: 15 mL via OROMUCOSAL

## 2022-09-26 MED ORDER — PANTOPRAZOLE SODIUM 40 MG PO TBEC
DELAYED_RELEASE_TABLET | ORAL | Status: AC
  Filled 2022-09-26: qty 1

## 2022-09-26 MED ORDER — SURGIPHOR WOUND IRRIGATION SYSTEM - OPTIME: TOPICAL | Status: DC | PRN

## 2022-09-26 MED ORDER — DEXAMETHASONE SODIUM PHOSPHATE 10 MG/ML IJ SOLN
8.0000 mg | Freq: Once | INTRAMUSCULAR | Status: AC
  Administered 2022-09-26: 8 mg via INTRAVENOUS

## 2022-09-26 MED ORDER — METOCLOPRAMIDE HCL 5 MG/ML IJ SOLN: 5.0000 mg | Freq: Three times a day (TID) | INTRAMUSCULAR | Status: DC | PRN

## 2022-09-26 MED ORDER — PHENYLEPHRINE 80 MCG/ML (10ML) SYRINGE FOR IV PUSH (FOR BLOOD PRESSURE SUPPORT)
PREFILLED_SYRINGE | INTRAVENOUS | Status: DC | PRN
  Administered 2022-09-26: 80 ug via INTRAVENOUS

## 2022-09-26 MED ORDER — POTASSIUM CHLORIDE CRYS ER 20 MEQ PO TBCR: 10.0000 meq | EXTENDED_RELEASE_TABLET | Freq: Every day | ORAL | Status: DC

## 2022-09-26 MED ORDER — HYDROCODONE-ACETAMINOPHEN 5-325 MG PO TABS
ORAL_TABLET | ORAL | Status: AC
  Filled 2022-09-26: qty 2

## 2022-09-26 MED ORDER — ACETAMINOPHEN 10 MG/ML IV SOLN: 1000.0000 mg | Freq: Once | INTRAVENOUS | Status: DC | PRN

## 2022-09-26 MED ORDER — BUPIVACAINE LIPOSOME 1.3 % IJ SUSP
INTRAMUSCULAR | Status: AC
  Filled 2022-09-26: qty 40

## 2022-09-26 MED ORDER — TRANEXAMIC ACID-NACL 1000-0.7 MG/100ML-% IV SOLN
1000.0000 mg | INTRAVENOUS | Status: AC
  Administered 2022-09-26 (×2): 1000 mg via INTRAVENOUS

## 2022-09-26 MED ORDER — TRANEXAMIC ACID 1000 MG/10ML IV SOLN
INTRAVENOUS | Status: AC
  Filled 2022-09-26: qty 10

## 2022-09-26 MED ORDER — MORPHINE SULFATE (PF) 4 MG/ML IV SOLN: 0.5000 mg | INTRAVENOUS | Status: DC | PRN

## 2022-09-26 MED ORDER — PANTOPRAZOLE SODIUM 40 MG PO TBEC
40.0000 mg | DELAYED_RELEASE_TABLET | Freq: Every day | ORAL | Status: DC
  Administered 2022-09-26 – 2022-09-27 (×2): 40 mg via ORAL

## 2022-09-26 MED ORDER — PROPOFOL 500 MG/50ML IV EMUL
INTRAVENOUS | Status: DC | PRN
  Administered 2022-09-26: 75 ug/kg/min via INTRAVENOUS

## 2022-09-26 MED ORDER — SODIUM CHLORIDE FLUSH 0.9 % IV SOLN
INTRAVENOUS | Status: AC
  Filled 2022-09-26: qty 40

## 2022-09-26 MED ORDER — PHENYLEPHRINE HCL (PRESSORS) 10 MG/ML IV SOLN
INTRAVENOUS | Status: DC | PRN
  Administered 2022-09-26: 100 ug via INTRAVENOUS
  Administered 2022-09-26: 80 ug via INTRAVENOUS

## 2022-09-26 MED ORDER — CEFAZOLIN SODIUM-DEXTROSE 2-4 GM/100ML-% IV SOLN
2.0000 g | Freq: Four times a day (QID) | INTRAVENOUS | Status: AC
  Administered 2022-09-26 (×2): 2 g via INTRAVENOUS

## 2022-09-26 MED ORDER — MIDAZOLAM HCL 2 MG/2ML IJ SOLN
INTRAMUSCULAR | Status: AC
  Filled 2022-09-26: qty 2

## 2022-09-26 MED ORDER — AMLODIPINE BESYLATE 5 MG PO TABS
10.0000 mg | ORAL_TABLET | Freq: Every day | ORAL | Status: DC
  Administered 2022-09-27: 10 mg via ORAL

## 2022-09-26 MED ORDER — APIXABAN 2.5 MG PO TABS
5.0000 mg | ORAL_TABLET | Freq: Two times a day (BID) | ORAL | Status: DC
  Administered 2022-09-27: 5 mg via ORAL

## 2022-09-26 MED ORDER — ATENOLOL 50 MG PO TABS
50.0000 mg | ORAL_TABLET | Freq: Every day | ORAL | Status: DC
  Administered 2022-09-27: 50 mg via ORAL

## 2022-09-26 MED ORDER — FENTANYL CITRATE (PF) 100 MCG/2ML IJ SOLN
INTRAMUSCULAR | Status: AC
  Filled 2022-09-26: qty 2

## 2022-09-26 MED ORDER — ONDANSETRON HCL 4 MG/2ML IJ SOLN
INTRAMUSCULAR | Status: AC
  Filled 2022-09-26: qty 2

## 2022-09-26 MED ORDER — PROPOFOL 10 MG/ML IV BOLUS
INTRAVENOUS | Status: DC | PRN
  Administered 2022-09-26: 20 mg via INTRAVENOUS
  Administered 2022-09-26: 40 mg via INTRAVENOUS

## 2022-09-26 MED ORDER — MIDAZOLAM HCL 5 MG/5ML IJ SOLN
INTRAMUSCULAR | Status: DC | PRN
  Administered 2022-09-26: 2 mg via INTRAVENOUS

## 2022-09-26 SURGICAL SUPPLY — 81 items
ADH SKN CLS APL DERMABOND .7 (GAUZE/BANDAGES/DRESSINGS) ×1
APL PRP STRL LF DISP 70% ISPRP (MISCELLANEOUS) ×2
BLADE PATELLA W-PILOT HOLE 35 (BLADE) IMPLANT
BLADE SAW SAG 25X90X1.19 (BLADE) ×1 IMPLANT
BLADE SAW SAG 29X58X.64 (BLADE) ×1 IMPLANT
BNDG CMPR 5X62 HK CLSR LF (GAUZE/BANDAGES/DRESSINGS)
BNDG ELASTIC 6INX 5YD STR LF (GAUZE/BANDAGES/DRESSINGS) ×1 IMPLANT
BOWL CEMENT MIX W/ADAPTER (MISCELLANEOUS) ×1 IMPLANT
BSPLAT TIB 5D G CMNT STM LT (Knees) ×1 IMPLANT
CEMENT BONE R 1X40 (Cement) ×2 IMPLANT
CHLORAPREP W/TINT 26 (MISCELLANEOUS) ×2 IMPLANT
COOLER POLAR GLACIER W/PUMP (MISCELLANEOUS) ×1 IMPLANT
CUFF TOURN SGL QUICK 24 (TOURNIQUET CUFF)
CUFF TOURN SGL QUICK 34 (TOURNIQUET CUFF)
CUFF TRNQT CYL 24X4X16.5-23 (TOURNIQUET CUFF) IMPLANT
CUFF TRNQT CYL 34X4.125X (TOURNIQUET CUFF) IMPLANT
DERMABOND ADVANCED .7 DNX12 (GAUZE/BANDAGES/DRESSINGS) ×1 IMPLANT
DRAPE 3/4 80X56 (DRAPES) ×1 IMPLANT
DRAPE INCISE IOBAN 66X60 STRL (DRAPES) ×1 IMPLANT
DRSG MEPILEX SACRM 8.7X9.8 (GAUZE/BANDAGES/DRESSINGS) ×1 IMPLANT
DRSG OPSITE POSTOP 4X10 (GAUZE/BANDAGES/DRESSINGS) IMPLANT
DRSG OPSITE POSTOP 4X8 (GAUZE/BANDAGES/DRESSINGS) IMPLANT
ELECT CAUTERY BLADE 6.4 (BLADE) ×1 IMPLANT
ELECT REM PT RETURN 9FT ADLT (ELECTROSURGICAL) ×1
ELECTRODE REM PT RTRN 9FT ADLT (ELECTROSURGICAL) ×1 IMPLANT
GLOVE BIO SURGEON STRL SZ8 (GLOVE) ×1 IMPLANT
GLOVE BIOGEL PI IND STRL 8 (GLOVE) ×1 IMPLANT
GLOVE PI ORTHO PRO STRL 7.5 (GLOVE) ×2 IMPLANT
GLOVE PI ORTHO PRO STRL SZ8 (GLOVE) ×2 IMPLANT
GOWN SRG XL LVL 3 NONREINFORCE (GOWNS) ×1 IMPLANT
GOWN STRL NON-REIN TWL XL LVL3 (GOWNS) ×1
GOWN STRL REUS W/ TWL LRG LVL3 (GOWN DISPOSABLE) ×1 IMPLANT
GOWN STRL REUS W/ TWL XL LVL3 (GOWN DISPOSABLE) ×1 IMPLANT
GOWN STRL REUS W/TWL LRG LVL3 (GOWN DISPOSABLE) ×1
GOWN STRL REUS W/TWL XL LVL3 (GOWN DISPOSABLE) ×1
HANDLE YANKAUER SUCT OPEN TIP (MISCELLANEOUS) ×1 IMPLANT
HDLS TROCR DRIL PIN KNEE 75 (PIN) ×1
HOOD PEEL AWAY T7 (MISCELLANEOUS) ×3 IMPLANT
INSERT ARTISURF S8-11 18X22X14 (Insert) IMPLANT
IV NS IRRIG 3000ML ARTHROMATIC (IV SOLUTION) ×1 IMPLANT
KIT TURNOVER KIT A (KITS) ×1 IMPLANT
MANIFOLD NEPTUNE II (INSTRUMENTS) ×1 IMPLANT
MARKER SKIN DUAL TIP RULER LAB (MISCELLANEOUS) ×1 IMPLANT
MAT ABSORB  FLUID 56X50 GRAY (MISCELLANEOUS) ×1
MAT ABSORB FLUID 56X50 GRAY (MISCELLANEOUS) ×1 IMPLANT
NDL HYPO 21X1.5 SAFETY (NEEDLE) ×1 IMPLANT
NDL SAFETY ECLIP 18X1.5 (MISCELLANEOUS) ×1 IMPLANT
NEEDLE HYPO 21X1.5 SAFETY (NEEDLE) ×1 IMPLANT
NS IRRIG 500ML POUR BTL (IV SOLUTION) ×1 IMPLANT
PACK TOTAL KNEE (MISCELLANEOUS) ×1 IMPLANT
PAD ARMBOARD 7.5X6 YLW CONV (MISCELLANEOUS) ×1 IMPLANT
PAD WRAPON POLAR KNEE (MISCELLANEOUS) ×1 IMPLANT
PENCIL SMOKE EVACUATOR (MISCELLANEOUS) ×1 IMPLANT
PIN DRILL HDLS TROCAR 75 4PK (PIN) IMPLANT
PSN FEM CR CMT CCR STD SZ10 L (Joint) ×1 IMPLANT
PULSAVAC PLUS IRRIG FAN TIP (DISPOSABLE) ×1
SCREW FEMALE HEX FIX 25X2.5 (ORTHOPEDIC DISPOSABLE SUPPLIES) IMPLANT
SCREW HEX HEADED 3.5X27 DISP (ORTHOPEDIC DISPOSABLE SUPPLIES) IMPLANT
SLEEVE SCD COMPRESS KNEE MED (STOCKING) ×1 IMPLANT
SOLUTION IRRIG SURGIPHOR (IV SOLUTION) ×1 IMPLANT
STEM POLY PAT PLY 35M KNEE (Knees) IMPLANT
STEM TIB ST PERS 14+30 (Stem) IMPLANT
STEM TIBIA 5 DEG SZ G L KNEE (Knees) IMPLANT
SURFACE ARTC PRSNA CCR SZ10 L (Joint) IMPLANT
SUT DVC 2 QUILL PDO  T11 36X36 (SUTURE) ×1
SUT DVC 2 QUILL PDO T11 36X36 (SUTURE) ×1 IMPLANT
SUT QUILL MONODERM 3-0 PS-2 (SUTURE) ×1 IMPLANT
SUT VIC AB 0 CT1 36 (SUTURE) ×1 IMPLANT
SUT VIC AB 2-0 CT2 27 (SUTURE) ×2 IMPLANT
SUT VICRYL 1-0 27IN ABS (SUTURE) ×1
SUTURE VICRYL 1-0 27IN ABS (SUTURE) ×1 IMPLANT
SYR 10ML LL (SYRINGE) ×1 IMPLANT
SYR 30ML LL (SYRINGE) ×2 IMPLANT
SYR 3ML LL SCALE MARK (SYRINGE) ×1 IMPLANT
TAPE CLOTH 3X10 WHT NS LF (GAUZE/BANDAGES/DRESSINGS) ×1 IMPLANT
TIBIA STEM 5 DEG SZ G L KNEE (Knees) ×1 IMPLANT
TIP FAN IRRIG PULSAVAC PLUS (DISPOSABLE) ×1 IMPLANT
TOWEL OR 17X26 4PK STRL BLUE (TOWEL DISPOSABLE) IMPLANT
TRAP FLUID SMOKE EVACUATOR (MISCELLANEOUS) ×1 IMPLANT
WATER STERILE IRR 1000ML POUR (IV SOLUTION) ×1 IMPLANT
WRAPON POLAR PAD KNEE (MISCELLANEOUS) ×1

## 2022-09-26 NOTE — Evaluation (Signed)
Physical Therapy Evaluation Patient Details Name: George Schmidt MRN: [id number] DOB: 01-01-1951 Today's Date: 09/26/2022  History of Present Illness  Pt is a 72 yo male s/p L TKA. PMH of sleep apnea, HTN, afib, HA, CVA, DM.  Clinical Impression  Patient alert, reported 7/10 L knee pain as well as back pain. Stated at baseline he is independent, lives with his wife and his mother, has plenty of DME. Supine to sit with supervision, use of bed rails. Sit <> stand with RW and CGA and he was able to take several steps to the recliner in the room.  Overall the patient demonstrated deficits (see "PT Problem List") that impede the patient's functional abilities, safety, and mobility and would benefit from skilled PT intervention. Recommendation is further skilled PT services to return pt to PLOF.        Recommendations for follow up therapy are one component of a multi-disciplinary discharge planning process, led by the attending physician.  Recommendations may be updated based on patient status, additional functional criteria and insurance authorization.  Follow Up Recommendations       Assistance Recommended at Discharge Frequent or constant Supervision/Assistance  Patient can return home with the following  A little help with walking and/or transfers;Assistance with cooking/housework;Direct supervision/assist for medications management;Assist for transportation;Help with stairs or ramp for entrance;Assistance with feeding;A little help with bathing/dressing/bathroom    Equipment Recommendations None recommended by PT  Recommendations for Other Services       Functional Status Assessment Patient has had a recent decline in their functional status and demonstrates the ability to make significant improvements in function in a reasonable and predictable amount of time.     Precautions / Restrictions Precautions Precautions: Fall;Knee Precaution Booklet Issued: No Restrictions Weight Bearing  Restrictions: Yes      Mobility  Bed Mobility Overal bed mobility: Needs Assistance Bed Mobility: Supine to Sit     Supine to sit: Supervision          Transfers Overall transfer level: Needs assistance Equipment used: Rolling walker (2 wheels) Transfers: Sit to/from Stand, Bed to chair/wheelchair/BSC Sit to Stand: Min guard   Step pivot transfers: Min guard            Ambulation/Gait Ambulation/Gait assistance: Min guard Gait Distance (Feet): 3 Feet Assistive device: Rolling walker (2 wheels)            Stairs            Wheelchair Mobility    Modified Rankin (Stroke Patients Only)       Balance Overall balance assessment: Needs assistance Sitting-balance support: Feet supported Sitting balance-Leahy Scale: Good     Standing balance support: Reliant on assistive device for balance Standing balance-Leahy Scale: Fair                               Pertinent Vitals/Pain Pain Assessment Pain Assessment: 0-10 Pain Score: 7  Pain Location: L knee Pain Descriptors / Indicators: Aching, Grimacing, Sore Pain Intervention(s): Limited activity within patient's tolerance, Repositioned, Monitored during session, Premedicated before session    Home Living Family/patient expects to be discharged to:: Private residence Living Arrangements: Spouse/significant other Available Help at Discharge: Family Type of Home: Mobile home Home Access: Ramped entrance       Home Layout: One level Home Equipment: Agricultural consultant (2 wheels);BSC/3in1;Grab bars - tub/shower;Shower seat      Prior Function Prior Level of Function : Independent/Modified Independent  Hand Dominance        Extremity/Trunk Assessment   Upper Extremity Assessment Upper Extremity Assessment: Overall WFL for tasks assessed    Lower Extremity Assessment Lower Extremity Assessment:  (s/p L TKA, RLE WFLs)       Communication    Communication: No difficulties  Cognition Arousal/Alertness: Awake/alert Behavior During Therapy: WFL for tasks assessed/performed Overall Cognitive Status: Within Functional Limits for tasks assessed                                          General Comments      Exercises Total Joint Exercises Heel Slides: AROM, Left, 10 reps, Strengthening Hip ABduction/ADduction: AROM, 10 reps, Strengthening, Left Goniometric ROM: 0-80   Assessment/Plan    PT Assessment Patient needs continued PT services  PT Problem List Decreased strength;Decreased mobility;Decreased range of motion;Decreased activity tolerance;Decreased balance;Pain       PT Treatment Interventions DME instruction;Therapeutic activities;Gait training;Patient/family education;Therapeutic exercise;Stair training;Balance training;Functional mobility training;Neuromuscular re-education    PT Goals (Current goals can be found in the Care Plan section)  Acute Rehab PT Goals Patient Stated Goal: to go home PT Goal Formulation: With patient Time For Goal Achievement: 10/10/22 Potential to Achieve Goals: Good    Frequency BID     Co-evaluation               AM-PAC PT "6 Clicks" Mobility  Outcome Measure Help needed turning from your back to your side while in a flat bed without using bedrails?: A Little Help needed moving from lying on your back to sitting on the side of a flat bed without using bedrails?: A Little Help needed moving to and from a bed to a chair (including a wheelchair)?: A Little Help needed standing up from a chair using your arms (e.g., wheelchair or bedside chair)?: A Little Help needed to walk in hospital room?: A Little Help needed climbing 3-5 steps with a railing? : A Little 6 Click Score: 18    End of Session Equipment Utilized During Treatment: Gait belt Activity Tolerance: Patient tolerated treatment well Patient left: with chair alarm set;in chair;with call bell/phone  within reach Nurse Communication: Mobility status PT Visit Diagnosis: Other abnormalities of gait and mobility (R26.89);Muscle weakness (generalized) (M62.81);Difficulty in walking, not elsewhere classified (R26.2)    Time: 1341-1400 PT Time Calculation (min) (ACUTE ONLY): 19 min   Charges:   PT Evaluation $PT Eval Low Complexity: 1 Low PT Treatments $Therapeutic Activity: 8-22 mins        Olga Coaster PT, DPT 2:31 PM,09/26/22

## 2022-09-26 NOTE — TOC Progression Note (Signed)
Transition of Care [facility]) - Progression Note    Patient Details  Name: George Schmidt MRN: [id number] Date of Birth: September 03, 1950  Transition of Care [facility]) CM/SW Contact  Marlowe Sax, RN Phone Number: 09/26/2022, 1:32 PM  Clinical Narrative:   The patient was set up with [facility] for [facility] prior to surgery by surgeons office         Expected Discharge Plan and Services                                               Social Determinants of Health (SDOH) Interventions SDOH Screenings   Food Insecurity: No Food Insecurity (09/26/2022)  Housing: Low Risk  (09/26/2022)  Transportation Needs: No Transportation Needs (09/26/2022)  Utilities: Not At Risk (09/26/2022)  Tobacco Use: Medium Risk (09/26/2022)    Readmission Risk Interventions     No data to display

## 2022-09-26 NOTE — Anesthesia Preprocedure Evaluation (Addendum)
Anesthesia Evaluation  Patient identified by MRN, date of birth, ID band Patient awake    Reviewed: Allergy & Precautions, NPO status , Patient's Chart, lab work & pertinent test results  History of Anesthesia Complications Negative for: history of anesthetic complications  Airway Mallampati: III  TM Distance: >3 FB Neck ROM: Full    Dental  (+) Teeth Intact, Loose,    Pulmonary sleep apnea , neg COPD, Patient abstained from smoking.Not current smoker, former smoker   Pulmonary exam normal breath sounds clear to auscultation       Cardiovascular Exercise Tolerance: Poor METShypertension, Pt. on medications (-) CAD and (-) Past MI + dysrhythmias Atrial Fibrillation  Rhythm:Irregular Rate:Normal - Systolic murmurs    Neuro/Psych  Headaches Residual cognitive deficits CVA, No Residual Symptoms  negative psych ROS   GI/Hepatic ,neg GERD  ,,(+)     (-) substance abuse    Endo/Other  diabetes, Oral Hypoglycemic Agents    Renal/GU CRFRenal disease     Musculoskeletal   Abdominal   Peds  Hematology   Anesthesia Other Findings Past Medical History: No date: Arthritis 08/25/2019: Basal cell carcinoma     Comment:  Left ant. shoulder latera. Superficial and nodular.  05/25/2020: Basal cell carcinoma     Comment:  L upper back lateral - ED&C 05/25/2020: Basal cell carcinoma     Comment:  L shoulder posterior adjacent to white patch - ED&C 05/25/2020: Basal cell carcinoma     Comment:  L upper medial back - ED&C No date: Dental bridge present     Comment:  permanent, bottom No date: Diabetes mellitus without complication (HCC) No date: Headache No date: Hemorrhoids No date: Hyperlipidemia No date: Hypertension 04/27/2020: Squamous cell carcinoma of skin     Comment:  R temple - [facility]HS 06/16/20 at [facility] No date: Stroke [facility])     Comment:  07/18/21  11/30/19  Reproductive/Obstetrics                              Anesthesia Physical Anesthesia Plan  ASA: 3  Anesthesia Plan: Spinal   Post-op Pain Management: Ofirmev IV (intra-op)*   Induction: Intravenous  PONV Risk Score and Plan: 1 and Ondansetron, Dexamethasone, Propofol infusion, TIVA and Midazolam  Airway Management Planned: Natural Airway  Additional Equipment: None  Intra-op Plan:   Post-operative Plan:   Informed Consent: I have reviewed the patients History and Physical, chart, labs and discussed the procedure including the risks, benefits and alternatives for the proposed anesthesia with the patient or authorized representative who has indicated his/her understanding and acceptance.       Plan Discussed with: CRNA and Surgeon  Anesthesia Plan Comments: (Discussed R/B/A of neuraxial anesthesia technique with patient: - rare risks of spinal/epidural hematoma, nerve damage, infection - Risk of PDPH - Risk of nausea and vomiting - Risk of conversion to general anesthesia and its associated risks, including sore throat, damage to lips/eyes/teeth/oropharynx, and rare risks such as cardiac and respiratory events. - Risk of allergic reactions  Discussed the role of CRNA in patient's perioperative care.  Patient voiced understanding.)       Anesthesia Quick Evaluation

## 2022-09-26 NOTE — Op Note (Signed)
Patient Name: George Schmidt  RUE:[id number]  Pre-Operative Diagnosis: Left knee Osteoarthritis  Post-Operative Diagnosis: (same)  Procedure: Left Total Knee Arthroplasty  Components/Implants: Femur: Persona Size 10 CR   Tibia: Persona Size G w 14x36mm stem extension  Poly: 10mm MC  Patella: 35x58mm symmetric  Femoral Valgus Cut Angle: 5 degrees  Distal Femoral Re-cut: none  Patella Resurfacing: yes   Date of Surgery: 09/26/2022  Surgeon: [name]  Assistant: RNFA Shon Hale (present and scrubbed throughout the case, critical for assistance with exposure, retraction, instrumentation, and closure)   Anesthesiologist: Suzan Slick  Anesthesia: Spinal  Tourniquet Time: 68 min  EBL: 75cc  IVF: 1000cc  Complications: None   Brief history: The patient is a 72 year old male with a history of osteoarthritis of the left knee with pain limiting their range of motion and activities of daily living, which has failed multiple attempts at conservative therapy.  The risks and benefits of total knee arthroplasty as definitive surgical treatment were discussed with the patient, who opted to proceed with the operation.  After outpatient medical clearance and optimization was completed the patient was admitted to [facility] for the procedure.  All preoperative films were reviewed and an appropriate surgical plan was made prior to surgery. Preoperative range of motion was 5 to 115 with a 5 deg flexion contracture. The patient was identified as having a varus alignment.   Description of procedure: The patient was brought to the operating room where laterality was confirmed by all those present to be the left side.   Spinal anesthesia was administered and the patient received an intravenous dose of antibiotics for surgical prophylaxis and a dose of tranexamic acid.  Patient is positioned supine on the operating room table with all bony prominences well-padded.  A well-padded  tourniquet was applied to the left thigh.  The knee was then prepped and draped in usual sterile fashion with multiple layers of adhesive and nonadhesive drapes.  All of those present in the operating room participated in a surgical timeout laterality and patient were confirmed.   An Esmarch was wrapped around the extremity and the leg was elevated and the knee flexed.  The tourniquet was inflated to a pressure of 275 mmHg. The Esmarch was removed and the leg was brought down to full extension.  The patella and tibial tubercle identified and outlined using a marking pen and a midline skin incision was made with a knife carried through the subcutaneous tissue down to the extensor retinaculum.  After exposure of the extensor mechanism the medial parapatellar arthrotomy was performed with a scalpel and electrocautery extending down medial and distal to the tibial tubercle taking care to avoid incising the patellar tendon.   A standard medial release was performed over the proximal tibia.  The knee was brought into extension in order to excise the fat pad taking care not to damage the patella tendon.  The superior soft tissue was removed from the anterior surface of the distal femur to visualize for the procedure.  The knee was then brought into flexion with the patella subluxed laterally and subluxing the tibia anteriorly.  The ACL was transected and removed with electrocautery and additional soft tissue was removed from the proximal surface of the tibia to fully expose. The PCL was found to be intact and was preserved.  An extramedullary tibial cutting guide was then applied to the leg with a spring-loaded ankle clamp placed around the distal tibia just above the malleoli the angulation of  the guide was adjusted to give some posterior slope in the tibial resection with an appropriate varus/valgus alignment.  The resection guide was then pinned to the proximal tibia and the proximal tibial surface was resected  with an oscillating saw.  Careful attention was paid to ensure the blade did not disrupt any of the soft tissues including any lateral or medial ligament.  Attention was then turned to the femur, with the knee slightly flexed a opening drill was used to enter the medullary canal of the femur.  After removing the drill marrow was suctioned out to decompress the distal femur.  An intramedullary femoral guide was then inserted into the drill hole and the alignment guide was seated firmly against the distal end of the medial femoral condyle.  The distal femoral cutting guide was then attached and pinned securely to the anterior surface of the femur and the intramedullary rod and alignment guide was removed.  Distal femur resection was then performed with an oscillating saw with retractors protecting medial and laterally.   The distal cutting block was then removed and the extension gap was checked with a spacer.  Extension gap was found to be appropriately sized to accommodate the spacer block.  The femoral sizing guide was then placed securely into the posterior condyles of the femur and the femoral size was measured and determined to be 10.  The size 10; 4-in-1 cutting guide was placed in position and secured with 2 pins.  The anterior posterior and chamfer resections were then performed with an oscillating saw.  Bony fragments and osteophytes were then removed.  Using a lamina spreader the posterior medial and lateral condyles were checked for additional osteophytes and posterior soft tissue remnants.  Any remaining meniscus was removed at this time.  Periarticular injection was performed in the meniscal rims and posterior capsule with aspiration performed to ensure no intravascular injection.   The tibia was then exposed and the tibial trial was pinned onto the plateau after confirming appropriate orientation and rotation.  Using the drill bushing the tibia was prepared to the appropriate drill depth.   Tibial broach impactor was then driven through the punch guide using a mallet.  The femoral trial component was then inserted onto the femur.  A trial tibial polyethylene bearing was then placed and the knee was reduced.  The knee achieved full extension with no hyperextension and was found to be balanced in flexion and extension with the trials in place.  The knee was then brought into full extension the patella was everted and held with 2 Kocher clamps.  The articular surface of the patella was then resected with an patella reamer and saw after careful measurement with a caliper.  The patella was then prepared with the drill guide and a trial patella was placed.  The knee was then taken through range of motion and it was found that the patella articulated appropriately with the trochlea and good patellofemoral motion without subluxation.    The correct final components for implantation were confirmed and opened by the circulator nurse.  The prepared surfaces of the patella femur and tibia were cleaned with pulsatile lavage to remove all blood fat and other material and then the surfaces were dried.  2 bags of cement were mixed under vacuum and the components were cemented into place.  Excess cement was removed with curettes and forceps. A trial polyethylene tibial component was placed and the knee was brought into extension to allow the cement to set.  At this time the periarticular injection cocktail was placed in the soft tissues surrounding the knee.  After full curing of the cement the balance of the knee was checked again and the final polyethylene size was confirmed. The tibial component was irrigated and locking mechanism checked to ensure it was clear of debris. The real polyethylene tibial component was implanted and the knee was brought through a range of motion.   The knee was then irrigated with copious amount of normal saline via pulsatile lavage to remove all loose bodies and other debris.  The  knee was then irrigated with surgiphor betadine based wash and reirrigated with saline.  The tourniquet was then dropped and all bleeding vessels were identified and coagulated.  The arthrotomy was approximated with #1 Vicryl and closed with #2 Quill suture.  The knee was brought into slight flexion and the subcutaneous tissues were closed with 0 Vicryl, 2-0 Vicryl and a running subcuticular 3-0 monoderm quil suture.  Skin was then glued with Dermabond.  A sterile adhesive dressing was then placed along with a sequential compression device to the calf, a Ted stocking, and a cryotherapy cuff.   Sponge, needle, and Lap counts were all correct at the end of the case.   The patient was transferred off of the operating room table to a hospital bed, good pulses were found distally on the operative side.  The patient was transferred to the recovery room in stable condition.

## 2022-09-26 NOTE — H&P (Signed)
History of Present Illness: The patient is an 72 y.o. male seen in clinic today for follow-up evaluation of the left knee prior to surgery. The patient reports continuing pain and disability in his left knee. Patient reports he has had pain in both knees left worse than right for years and has undergone treatments for both knees including cortisone and gel injections in the past with diminishing results. He reports he was scheduled to undergo a knee replacement about a year and a half ago but had to delay due to getting COVID and then having a small stroke. He is now medically stable but continuing to have ongoing issues with his knees including constant aching pain and stabbing pain over the medial side of both knees up to a 10 out of 10 in the left knee and a 6 out of 10 on the right knee. He has been treating the pain with Voltaren and in the past on anti-inflammatories prior to being on Eliquis. He reports the knees give out on him with severe grinding sensations when bending. He reports significant stiffness when getting up from a chair or getting in and out of a car and is unable to put his socks and shoes on his left foot. He denies any trauma to his knees. He reports his knees to become a functional limitation for him on a daily basis preventing walking and participating in activities of daily living despite conservative treatments including injections, anti-inflammatories, and exercises/therapy. The patient denies fevers, chills, numbness, tingling, shortness of breath, chest pain, recent illness, or any trauma.   The patient is a non smoker, hemoglobin A1C 6.2, BMI 34.5  Past Medical History: Past Medical History:  Diagnosis Date  Arthritis 08/18/2013  Diabetes mellitus type 2, uncomplicated (CMS/HHS-HCC)  Diverticulosis 10/21/2014  GERD (gastroesophageal reflux disease)  History of cancer skin  History of hemorrhoids 08/18/2013  History of stroke 12/01/2019  Hyperlipidemia  Hypertension   Internal hemorrhoids 10/21/2014  Stage 3b chronic kidney disease (CKD) (CMS/HHS-HCC) 07/18/2021   Past Surgical History: Past Surgical History:  Procedure Laterality Date  COLONOSCOPY 10/21/2014  Diverticulosis/Otherwise normal/Repeat 25yrs/MGR   Past Family History: Family History  Problem Relation Age of Onset  Coronary Artery Disease (Blocked arteries around heart) Mother 9  Alcohol abuse Father  Pneumonia Father  No Known Problems Daughter  No Known Problems Daughter   Medications: Current Outpatient Medications  Medication Sig Dispense Refill  ACCU-CHEK FASTCLIX LANCET DRUM CHECK BLOOD SUGAR 3 TIMES DAILY 300 each 3  ACCU-CHEK GUIDE TEST STRIPS test strip USE TO CHECK FASTING BLOOD SUGAR TWICE DAILY 200 strip 2  amLODIPine (NORVASC) 10 MG tablet TAKE 1 TABLET BY MOUTH ONCE DAILY 100 tablet 2  [START ON 10/19/2022] atenoloL (TENORMIN) 25 MG tablet Take 1 tablet (25 mg total) by mouth once daily 90 tablet 2  atenoloL (TENORMIN) 25 MG tablet Take 1 tablet (25 mg total) by mouth once daily 30 tablet 0  atorvastatin (LIPITOR) 40 MG tablet TAKE 1 TABLET BY MOUTH ONCE DAILY 100 tablet 2  calcium carbonate (TUMS) 200 mg calcium (500 mg) chewable tablet Take 2 tablets by mouth as needed for Heartburn  [START ON 10/19/2022] chlorthalidone 25 MG tablet Take 1 tablet (25 mg total) by mouth once daily 90 tablet 2  chlorthalidone 25 MG tablet Take 1 tablet (25 mg total) by mouth once daily 30 tablet 0  ELIQUIS 5 mg tablet TAKE 1 TABLET BY MOUTH TWICE DAILY 200 tablet 2  fenofibrate 160 MG tablet TAKE 1 TABLET  BY MOUTH ONCE DAILY 100 tablet 2  IBUPROFEN (ADVIL ORAL) Take by mouth once daily.  metFORMIN (GLUCOPHAGE-XR) 500 MG XR tablet TAKE 2 TABLETS BY MOUTH ONCE DAILY 200 tablet 0  potassium chloride (KLOR-CON M20) 20 MEQ ER tablet TAKE 1 TABLET BY MOUTH TWICE DAILY 200 tablet 2  sildenafiL (VIAGRA) 100 MG tablet Take 1 tablet (100 mg total) by mouth once daily as needed for Erectile Dysfunction  90 tablet 3   No current facility-administered medications for this visit.   Allergies: Allergies  Allergen Reactions  Amoxicillin Hives  Pt took this and clarithromycin for H-pylori back in 2013 developed a rash was not sure which med caused it  Clarithromycin Hives  Pt took this and amoxicillin for H-pylori back in 2013 developed a rash was not sure which med caused it  Propranolol Rash    Visit Vitals: Vitals:  09/20/22 1438  BP: 124/80    Review of Systems:  A comprehensive 14 point ROS was performed, reviewed, and the pertinent orthopaedic findings are documented in the HPI.  Physical Exam: General/Constitutional: No apparent distress: well-nourished and well developed. Eyes: Pupils equal, round with synchronous movement. Pulmonary exam: Lungs clear to auscultation bilaterally no wheezing rales or rhonchi Cardiac exam: Regular rate and rhythm no obvious murmurs rubs or gallops. Integumentary: No impressive skin lesions present, except as noted in detailed exam. Neuro/Psych: Normal mood and affect, oriented to person, place and time.  Comprehensive Knee Exam: Gait Non-antalgic and fluid  Alignment Varus thrust bilaterally   Inspection Right Left  Skin Normal appearance with no obvious deformity. No ecchymosis or erythema. Normal appearance with no obvious deformity. No ecchymosis or erythema.  Soft Tissue No focal soft tissue swelling No focal soft tissue swelling  Quad Atrophy None None   Palpation  Right Left  Tenderness Medial joint line tenderness to palpation Medial joint line tenderness to palpation  Crepitus + patellofemoral and tibiofemoral crepitus + patellofemoral and tibiofemoral crepitus  Effusion None None   Range of Motion               Right Left  Flexion 5-120 5-115  Extension Full knee extension without hyperextension Full knee extension without hyperextension   Ligamentous Exam Right Left  Lachman Normal Normal  Valgus 0 1+ with endpoint  partially correcting deformity 1+ with endpoint partially correcting deformity  Valgus 30 Normal Normal  Varus 0 Normal Normal  Varus 30 Normal Normal  Anterior Drawer Normal Normal  Posterior Drawer Normal Normal   Meniscal Exam Right Left  Hyperflexion Test Positive Positive  Hyperextension Test Positive Positive  McMurray's Positive Positive   Neurovascular Right Left  Quadriceps Strength 5/5 5/5  Hamstring Strength 5/5 5/5  Hip Abductor Strength 4/5 4/5  Distal Motor Normal Normal  Distal Sensory Normal light touch sensation Normal light touch sensation  Distal Pulses Normal Normal    Imaging Studies: I have reviewed AP, lateral,sunrise, and flexed PA weight bearing knee X-rays (5 views) of the bilateral knee taken the previous office visit show severe degenerative changes with medial and patellofemoral joint space narrowing with medial bone-on-bone arthritis in both knees, osteophyte formation, subchondral cysts and sclerosis. The right knee has a large ossified body in the posterior medial joint space adjacent to the joint collapse. Kellgren-Lawrence grade 4 in both knees. No acute appearing fracture dislocations noted in either knee.   Assessment:  Bilateral knee osteoarthritis  Plan: Ocie is a 72 year old male who presents with bilateral knee bone on bone arthritis with  more severe pain and his ability in his left knee at this time. Based upon the patient's continued symptoms and failure to respond to conservative treatment, I have recommended a left total knee replacement for this patient. A long discussion took place with the patient describing what a total joint replacement is and what the procedure would entail. A knee model, similar to the implants that will be used during the operation, was utilized to demonstrate the implants. Choices of implant manufactures were discussed and reviewed. The ability to secure the implant utilizing cement or cementless (press fit)  fixation was discussed. The approach and exposure was discussed.   The hospitalization and post-operative care and rehabilitation were also discussed. The use of perioperative antibiotics and DVT prophylaxis were discussed. The risk, benefits and alternatives to a surgical intervention were discussed at length with the patient. The patient was also advised of risks related to the medical comorbidities and elevated body mass index (BMI). A lengthy discussion took place to review the most common complications including but not limited to: stiffness, loss of function, complex regional pain syndrome, deep vein thrombosis, pulmonary embolus, heart attack, stroke, infection, wound breakdown, numbness, intraoperative fracture, damage to nerves, tendon,muscles, arteries or other blood vessels, death and other possible complications from anesthesia. The patient was told that we will take steps to minimize these risks by using sterile technique, antibiotics and DVT prophylaxis when appropriate and follow the patient postoperatively in the office setting to monitor progress. The possibility of recurrent pain, no improvement in pain and actual worsening of pain were also discussed with the patient.   The discharge plan of care focused on the patient going home following surgery. The patient was encouraged to make the necessary arrangements to have someone stay with them when they are discharged home.   The benefits of surgery were discussed with the patient including the potential for improving the patient's current clinical condition through operative intervention. Alternatives to surgical intervention including continued conservative management were also discussed in detail. All questions were answered to the satisfaction of the patient. The patient participated and agreed to the plan of care as well as the use of the recommended implants for their total knee replacement surgery. An information packet was given to the  patient to review prior to surgery.   The patient received clearance for surgery. He held his eliquis prior to surgery. All questions answered the patient agrees to the above plan to prepare for a left total knee replacement.   Portions of this record have been created using Scientist, clinical (histocompatibility and immunogenetics). Dictation errors have been sought, but may not have been identified and corrected.  [name]

## 2022-09-26 NOTE — Anesthesia Procedure Notes (Addendum)
Spinal  Patient location during procedure: OR Start time: 09/26/2022 7:35 AM End time: 09/26/2022 7:39 AM Reason for block: surgical anesthesia Staffing Performed: resident/CRNA  Anesthesiologist: Corinda Gubler, MD Resident/CRNA: Emeterio Reeve, CRNA Performed by: Emeterio Reeve, CRNA Authorized by: Corinda Gubler, MD   Preanesthetic Checklist Completed: patient identified, IV checked, site marked, risks and benefits discussed, surgical consent, monitors and equipment checked, pre-op evaluation and timeout performed Spinal Block Patient position: sitting Prep: DuraPrep and site prepped and draped Patient monitoring: heart rate, cardiac monitor, continuous pulse ox and blood pressure Approach: midline Location: L3-4 Injection technique: single-shot Needle Needle type: Sprotte  Needle gauge: 24 G Needle length: 9 cm Assessment Sensory level: T4 Events: CSF return Additional Notes First attempt: Paresthesia felt on right, needle redirected to the left with resolution of paresthesia; return on clear, free flowing CSF upon pass. CA

## 2022-09-26 NOTE — Interval H&P Note (Signed)
Patient history and physical updated. Consent reviewed including risks, benefits, and alternatives to surgery. Patient agrees with above plan to proceed with left total knee arthroplasty.  

## 2022-09-26 NOTE — Transfer of Care (Signed)
Immediate Anesthesia Transfer of Care Note  Patient: George Schmidt  Procedure(s) Performed: TOTAL KNEE ARTHROPLASTY-RNFA (Left: Knee)  Patient Location: PACU  Anesthesia Type:Spinal  Level of Consciousness: awake, alert , and oriented  Airway & Oxygen Therapy: Patient Spontanous Breathing  Post-op Assessment: Report given to RN and Post -op Vital signs reviewed and stable  Post vital signs: stable  Last Vitals:  Vitals Value Taken Time  BP 90/69 09/26/22 0954  Temp 36.6 C 09/26/22 0952  Pulse 67 09/26/22 0958  Resp 17 09/26/22 0958  SpO2 95 % 09/26/22 0958  Vitals shown include unvalidated device data.  Last Pain:  Vitals:   09/26/22 0635  TempSrc: Temporal  PainSc: 0-No pain         Complications: No notable events documented.

## 2022-09-27 ENCOUNTER — Encounter: Payer: Self-pay | Admitting: Orthopedic Surgery

## 2022-09-27 DIAGNOSIS — M1712 Unilateral primary osteoarthritis, left knee: Secondary | ICD-10-CM | POA: Diagnosis not present

## 2022-09-27 LAB — CBC
HCT: 35.2 % — ABNORMAL LOW (ref 39.0–52.0)
Hemoglobin: 12.4 g/dL — ABNORMAL LOW (ref 13.0–17.0)
MCH: 31.9 pg (ref 26.0–34.0)
MCHC: 35.2 g/dL (ref 30.0–36.0)
MCV: 90.5 fL (ref 80.0–100.0)
Platelets: 238 10*3/uL (ref 150–400)
RBC: 3.89 MIL/uL — ABNORMAL LOW (ref 4.22–5.81)
RDW: 11.9 % (ref 11.5–15.5)
WBC: 15.5 10*3/uL — ABNORMAL HIGH (ref 4.0–10.5)
nRBC: 0 % (ref 0.0–0.2)

## 2022-09-27 LAB — BASIC METABOLIC PANEL
Anion gap: 9 (ref 5–15)
BUN: 34 mg/dL — ABNORMAL HIGH (ref 8–23)
CO2: 21 mmol/L — ABNORMAL LOW (ref 22–32)
Calcium: 9 mg/dL (ref 8.9–10.3)
Chloride: 108 mmol/L (ref 98–111)
Creatinine, Ser: 1.74 mg/dL — ABNORMAL HIGH (ref 0.61–1.24)
GFR, Estimated: 41 mL/min — ABNORMAL LOW (ref >60)
Glucose, Bld: 139 mg/dL — ABNORMAL HIGH (ref 70–99)
Potassium: 3.2 mmol/L — ABNORMAL LOW (ref 3.5–5.1)
Sodium: 138 mmol/L (ref 135–145)

## 2022-09-27 LAB — GLUCOSE, CAPILLARY: Glucose-Capillary: 136 mg/dL — ABNORMAL HIGH (ref 70–99)

## 2022-09-27 MED ORDER — HYDROCODONE-ACETAMINOPHEN 5-325 MG PO TABS
ORAL_TABLET | ORAL | Status: AC
  Filled 2022-09-27: qty 2

## 2022-09-27 MED ORDER — TRAMADOL HCL 50 MG PO TABS: 50.0000 mg | ORAL_TABLET | Freq: Four times a day (QID) | ORAL | 0 refills | Status: DC | PRN

## 2022-09-27 MED ORDER — POTASSIUM CHLORIDE CRYS ER 20 MEQ PO TBCR
EXTENDED_RELEASE_TABLET | ORAL | Status: AC
  Filled 2022-09-27: qty 1

## 2022-09-27 MED ORDER — INSULIN ASPART 100 UNIT/ML IJ SOLN
INTRAMUSCULAR | Status: AC
  Filled 2022-09-27: qty 1

## 2022-09-27 MED ORDER — PANTOPRAZOLE SODIUM 40 MG PO TBEC
DELAYED_RELEASE_TABLET | ORAL | Status: AC
  Filled 2022-09-27: qty 1

## 2022-09-27 MED ORDER — ATENOLOL 50 MG PO TABS
ORAL_TABLET | ORAL | Status: AC
  Filled 2022-09-27: qty 1

## 2022-09-27 MED ORDER — APIXABAN 2.5 MG PO TABS
ORAL_TABLET | ORAL | Status: AC
  Filled 2022-09-27: qty 2

## 2022-09-27 MED ORDER — AMLODIPINE BESYLATE 5 MG PO TABS
ORAL_TABLET | ORAL | Status: AC
  Filled 2022-09-27: qty 2

## 2022-09-27 MED ORDER — POTASSIUM CHLORIDE CRYS ER 20 MEQ PO TBCR
20.0000 meq | EXTENDED_RELEASE_TABLET | Freq: Two times a day (BID) | ORAL | Status: DC
  Administered 2022-09-27: 20 meq via ORAL

## 2022-09-27 MED ORDER — OXYCODONE HCL 5 MG PO TABS: 2.5000 mg | ORAL_TABLET | Freq: Four times a day (QID) | ORAL | 0 refills | Status: DC | PRN

## 2022-09-27 MED ORDER — ATORVASTATIN CALCIUM 20 MG PO TABS
ORAL_TABLET | ORAL | Status: AC
  Filled 2022-09-27: qty 2

## 2022-09-27 MED ORDER — DOCUSATE SODIUM 100 MG PO CAPS
ORAL_CAPSULE | ORAL | Status: AC
  Filled 2022-09-27: qty 1

## 2022-09-27 MED ORDER — ACETAMINOPHEN 500 MG PO TABS: 1000.0000 mg | ORAL_TABLET | Freq: Three times a day (TID) | ORAL | 0 refills | Status: DC

## 2022-09-27 NOTE — Evaluation (Signed)
Occupational Therapy Evaluation Patient Details Name: George Schmidt MRN: [id number] DOB: 1951/02/22 Today's Date: 09/27/2022   History of Present Illness Pt is a 72 yo male s/p L TKA. PMH of sleep apnea, HTN, afib, HA, CVA, DM.   Clinical Impression   [name] was seen for OT evaluation this date. Prior to hospital admission, pt was MOD I using RW. Pt lives with spouse. Pt reports 5/10 pain at rest. Pt currently requires MIN A don underwear/shorts, assist for threading over L foot and SBA pulling up in standing. SUP for functional mobility ~50 ft, good RW technique. Pt instructed in polar care mgt, falls prevention strategies, home/routines modifications, DME/AE for LB bathing/dressing tasks, and compression stocking mgt. Handout provided. All education complete, will sign off. Upon hospital discharge, recommend no OT follow up.   Recommendations for follow up therapy are one component of a multi-disciplinary discharge planning process, led by the attending physician.  Recommendations may be updated based on patient status, additional functional criteria and insurance authorization.   Assistance Recommended at Discharge Intermittent Supervision/Assistance  Patient can return home with the following Help with stairs or ramp for entrance;A little help with walking and/or transfers;A little help with bathing/dressing/bathroom    Functional Status Assessment  Patient has had a recent decline in their functional status and demonstrates the ability to make significant improvements in function in a reasonable and predictable amount of time.  Equipment Recommendations  BSC/3in1    Recommendations for Other Services       Precautions / Restrictions Precautions Precautions: Fall;Knee Precaution Booklet Issued: Yes (comment) Restrictions Weight Bearing Restrictions: Yes LLE Weight Bearing: Weight bearing as tolerated      Mobility Bed Mobility               General bed mobility  comments: received and left sitting    Transfers Overall transfer level: Needs assistance Equipment used: Rolling walker (2 wheels) Transfers: Sit to/from Stand, Bed to chair/wheelchair/BSC Sit to Stand: Supervision                  Balance Overall balance assessment: Needs assistance Sitting-balance support: Feet supported Sitting balance-Leahy Scale: Good     Standing balance support: No upper extremity supported, During functional activity Standing balance-Leahy Scale: Good                             ADL either performed or assessed with clinical judgement   ADL Overall ADL's : Needs assistance/impaired                                       General ADL Comments: MIN A don underwear/shorts, assist for threading over L foot and SBA pulling up in standing. SBA donning shirt in standing, no LOBs noted      Pertinent Vitals/Pain Pain Assessment Pain Assessment: 0-10 Pain Score: 5  Pain Location: L knee Pain Descriptors / Indicators: Aching, Grimacing, Sore Pain Intervention(s): Limited activity within patient's tolerance, Repositioned     Hand Dominance     Extremity/Trunk Assessment Upper Extremity Assessment Upper Extremity Assessment: Overall WFL for tasks assessed   Lower Extremity Assessment Lower Extremity Assessment: Overall WFL for tasks assessed       Communication Communication Communication: No difficulties   Cognition Arousal/Alertness: Awake/alert Behavior During Therapy: WFL for tasks assessed/performed Overall Cognitive Status: Within Functional Limits for tasks  assessed                                                  Home Living Family/patient expects to be discharged to:: Private residence Living Arrangements: Spouse/significant other Available Help at Discharge: Family Type of Home: Mobile home Home Access: Ramped entrance     Home Layout: One level     Bathroom Shower/Tub:  Arts development officer Toilet: Handicapped height     Home Equipment: Agricultural consultant (2 wheels);BSC/3in1;Grab bars - tub/shower;Shower seat          Prior Functioning/Environment Prior Level of Function : Independent/Modified Independent                        OT Problem List: Decreased range of motion;Decreased activity tolerance;Decreased safety awareness         OT Goals(Current goals can be found in the care plan section) Acute Rehab OT Goals Patient Stated Goal: to go home OT Goal Formulation: With patient Time For Goal Achievement: 09/27/22 Potential to Achieve Goals: Good   AM-PAC OT "6 Clicks" Daily Activity     Outcome Measure Help from another person eating meals?: None Help from another person taking care of personal grooming?: A Little Help from another person toileting, which includes using toliet, bedpan, or urinal?: A Little Help from another person bathing (including washing, rinsing, drying)?: A Little Help from another person to put on and taking off regular upper body clothing?: None Help from another person to put on and taking off regular lower body clothing?: A Little 6 Click Score: 20   End of Session Nurse Communication: Mobility status  Activity Tolerance: Patient tolerated treatment well Patient left: in chair;with call bell/phone within reach  OT Visit Diagnosis: Other abnormalities of gait and mobility (R26.89);Muscle weakness (generalized) (M62.81)                Time: 4403-4742 OT Time Calculation (min): 25 min Charges:  OT General Charges $OT Visit: 1 Visit OT Evaluation $OT Eval Low Complexity: 1 Low  Kathie Dike, M.S. OTR/L  09/27/22, 10:51 AM  ascom [phone]

## 2022-09-27 NOTE — Discharge Summary (Signed)
Physician Discharge Summary  Patient ID: George Schmidt MRN: [id number] DOB/AGE: 08/09/1950 72 y.o.  Admit date: 09/26/2022 Discharge date: 09/27/2022  Admission Diagnoses:  S/P TKR (total knee replacement) using cement, left [Z96.652] Left knee osteoarthritis  Discharge Diagnoses: Patient Active Problem List   Diagnosis Date Noted   S/P TKR (total knee replacement) using cement, left 09/26/2022   History of COVID-19 07/19/2021   CVA (cerebral vascular accident) (HCC) 07/19/2021   Acute right MCA stroke (HCC) 07/18/2021   Stage 3b chronic kidney disease (CKD) (HCC) 07/18/2021   Cerebellar stroke, acute (HCC) 11/30/2019   Cerebellar stroke (HCC) 11/30/2019   Hypertension    Diabetes mellitus without complication (HCC)    Hyperlipidemia    Elevated troponin    Vertigo    Hypokalemia    Elevated troponin I level     Past Medical History:  Diagnosis Date   A-fib (HCC)    a.) CHA2DS2VASc = 5 (age, HTN, CVA x 2, T2DM);  b.) rate/rhythm maintained on oral beta blocker (atenolol); chronically anticoagulated with apixaban   Acute ischemic right middle cerebral artery (MCA) stroke (HCC) 07/18/2021   a.) CT head and MRI brain 07/18/2021: acute or subacute RIGHT anterior MCA territory infarct. 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 shoulder latera. Superficial and nodular.    Basal cell carcinoma 05/25/2020   L upper back lateral - ED&C   Basal cell carcinoma 05/25/2020   L shoulder posterior adjacent to white patch - ED&C   Basal cell carcinoma 05/25/2020   L upper medial back - ED&C    Basal cell carcinoma 04/10/2022   Left upper back. Superficial and nodular. EDC done 06/19/22   Basal cell carcinoma 04/10/2022   Mid back. Superficial and nodular. EDC done 06/19/22   Bradycardia    CKD (chronic kidney disease), stage III (HCC)    Dental bridge present    permanent, bottom   Diastolic dysfunction    a.) TTE 07/19/2021: EF 50-55%, no RWMAs, G2DD   Diverticulosis    Erectile dysfunction    a.) on PDE5i (sildenafil)   GERD (gastroesophageal reflux disease)    Headache    Hemorrhoids    History of bilateral cataract extraction 03/2022   Hyperlipidemia    Hypertension    Long term current use of anticoagulant    a.) apixaban   Sleep apnea    a.) not using nocturnal PAP therapy   Squamous cell carcinoma of skin 04/27/2020   R temple - MOHS 06/16/20 at [facility]   T2DM (type 2 diabetes mellitus) (HCC)      Transfusion: None.   Consultants (if any):   Discharged Condition: Improved  Hospital Course: Geoff Warley is an 72 y.o. male who was admitted 09/26/2022 with a diagnosis of left knee osteoarthritis and went to the operating room on 09/26/2022 and underwent the above named procedures.    Surgeries: Procedure(s): TOTAL KNEE ARTHROPLASTY-RNFA on 09/26/2022 Patient tolerated the surgery well. Taken to PACU where she was stabilized and then transferred to the orthopedic floor.  Continued on his  chronic Eliquis 5mg  BID following surgery. SCDs applied, TED hose applied. Heels elevated on bed with rolled towels. No evidence of DVT. Negative Homan. Physical therapy started on day #1 for gait training and transfer. OT started day #1 for ADL and assisted devices.  Patient's IV was removed on POD1.  Implants: Femur: Persona Size 10 CR   Tibia: Persona Size G w 14x45mm stem extension  Poly: 10mm MC  Patella: 35x1mm symmetric   He was given perioperative antibiotics:  Anti-infectives (From admission, onward)    Start     Dose/Rate Route Frequency Ordered Stop    09/26/22 1330  ceFAZolin (ANCEF) IVPB 2g/100 mL premix        2 g 200 mL/hr over 30 Minutes Intravenous Every 6 hours 09/26/22 1013 09/26/22 2110   09/26/22 0630  ceFAZolin (ANCEF) IVPB 2g/100 mL premix        2 g 200 mL/hr over 30 Minutes Intravenous On call to O.R. 09/26/22 1610 09/26/22 0740     .  He was given sequential compression devices, early ambulation, and Eliquis for DVT prophylaxis.  He benefited maximally from the hospital stay and there were no complications.    Recent vital signs:  Vitals:   09/27/22 0249 09/27/22 0606  BP: 121/83 125/84  Pulse: 97 90  Resp: 18 20  Temp: 98.2 F (36.8 C) 97.9 F (36.6 C)  SpO2: 95% 94%    Recent laboratory studies:  Lab Results  Component Value Date   HGB 12.4 (L) 09/27/2022   HGB 14.2 07/18/2021   HGB 13.3 11/30/2019   Lab Results  Component Value Date   WBC 15.5 (H) 09/27/2022   PLT 238 09/27/2022   Lab Results  Component Value Date   INR 1.1 07/18/2021   Lab Results  Component Value Date   NA 138 09/27/2022   K 3.2 (L) 09/27/2022   CL 108 09/27/2022   CO2 21 (L) 09/27/2022   BUN 34 (H) 09/27/2022   CREATININE 1.74 (H) 09/27/2022   GLUCOSE 139 (H) 09/27/2022    Discharge Medications:   Allergies as of 09/27/2022       Reactions   Amoxicillin Hives   Clarithromycin Hives   Propranolol Rash        Medication List     STOP taking these medications    ibuprofen 200 MG tablet Commonly known as: ADVIL       TAKE these medications    acetaminophen 500 MG tablet Commonly known as: TYLENOL Take 2 tablets (1,000 mg total) by mouth every 8 (eight) hours.   amLODipine 10 MG tablet Commonly known as: NORVASC Take 10 mg by mouth daily.   apixaban 5 MG Tabs tablet Commonly known as: ELIQUIS Take 1 tablet (5 mg total) by mouth 2 (two) times daily.   atenolol 25 MG tablet Commonly known as: TENORMIN Take 25 mg by mouth daily.   atorvastatin 40 MG tablet Commonly known as: Lipitor Take 1  tablet (40 mg total) by mouth daily.   calcium carbonate 500 MG chewable tablet Commonly known as: TUMS - dosed in mg elemental calcium Chew 1 tablet by mouth as needed for indigestion or heartburn. 2 tablets   chlorthalidone 25 MG tablet Commonly known as: HYGROTON Take 25 mg by mouth daily.   fenofibrate 160 MG tablet Take 160 mg by mouth daily.   metFORMIN 500 MG 24 hr tablet Commonly known as: GLUCOPHAGE-XR 500 mg in the morning and at bedtime. Take 2 tablets by mouth once  daily   mupirocin ointment 2 % Commonly known as: BACTROBAN Apply 1 Application topically daily.   oxyCODONE 5 MG immediate release tablet Commonly known as: Roxicodone Take 0.5-1 tablets (2.5-5 mg total) by mouth every 6 (six) hours as needed for breakthrough pain.   potassium chloride SA 20 MEQ tablet Commonly known as: KLOR-CON M Take 20 mEq by mouth 2 (two) times daily.   PRESERVISION AREDS PO Take by mouth 2 (two) times daily.   sildenafil 100 MG tablet Commonly known as: VIAGRA Take by mouth.   traMADol 50 MG tablet Commonly known as: ULTRAM Take 1 tablet (50 mg total) by mouth every 6 (six) hours as needed for moderate pain.       Diagnostic Studies: DG Knee Left Port  Result Date: 09/26/2022 CLINICAL DATA:  Status post total knee arthroplasty. EXAM: PORTABLE LEFT KNEE - 1-2 VIEW COMPARISON:  None Available. FINDINGS: Status post total left knee arthroplasty. No perihardware lucency is seen to indicate hardware failure or loosening. Expected postoperative changes including mild-to-moderate joint effusion and both intra-articular and anterior knee subcutaneous air. No acute fracture or dislocation. Mild-to-moderate atherosclerotic calcifications. IMPRESSION: Status post total left knee arthroplasty without evidence of hardware failure. Electronically Signed   By: Neita Garnet M.D.   On: 09/26/2022 10:24    Disposition: Plan for discharge home today pending progress with PT.   Follow-up  Information     Evon Slack, PA-C Follow up in 14 day(s).   Specialties: Orthopedic Surgery, Emergency Medicine Why: Skin check. Contact information: [address] [place] [address] [phone]                Signed: Meriel Pica PA-C 09/27/2022, 7:48 AM

## 2022-09-27 NOTE — Progress Notes (Signed)
DISCHARGE NOTE:  Pt given discharge instructions and verbalized understanding. TED hose on both legs. Pt wheeled to car by staff, wife providing transportation.

## 2022-09-27 NOTE — Discharge Instructions (Signed)
 Instructions after Total Knee Replacement   Zachary Aberman M.D.     Dept. of Orthopaedics & Sports Medicine  [facility]  [address]  [place], [place]  [address]  Phone: [phone]   Fax: [phone]    DIET: Drink plenty of non-alcoholic fluids. Resume your normal diet. Include foods high in fiber.  ACTIVITY:  You may use crutches or a walker with weight-bearing as tolerated, unless instructed otherwise. You may be weaned off of the walker or crutches by your Physical Therapist.  Do NOT place pillows under the knee. Anything placed under the knee could limit your ability to straighten the knee.   Continue doing gentle exercises. Exercising will reduce the pain and swelling, increase motion, and prevent muscle weakness.   Please continue to use the TED compression stockings for 2 weeks. You may remove the stockings at night, but should reapply them in the morning. Do not drive or operate any equipment until instructed.  WOUND CARE:  Continue to use the PolarCare or ice packs periodically to reduce pain and swelling. You may begin showering 3 days after surgery with honeycomb dressing. Remove honeycomb dressing 7 days after surgery and continue showering. Allow dermabond to fall off on its own.  MEDICATIONS: You may resume your regular medications. Please take the pain medication as prescribed on the medication. Do not take pain medication on an empty stomach. You have been given a prescription for a blood thinner (Lovenox or Coumadin). Please take the medication as instructed. (NOTE: After completing a 2 week course of Lovenox, take one 81 mg Enteric-coated aspirin twice a day for 3 additional weeks. This along with elevation will help reduce the possibility of phlebitis in your operated leg.) Do not drive or drink alcoholic beverages when taking pain medications.  POSTOPERATIVE CONSTIPATION PROTOCOL Constipation - defined medically as fewer than three stools per  week and severe constipation as less than one stool per week.  One of the most common issues patients have following surgery is constipation.  Even if you have a regular bowel pattern at home, your normal regimen is likely to be disrupted due to multiple reasons following surgery.  Combination of anesthesia, postoperative narcotics, change in appetite and fluid intake all can affect your bowels.  In order to avoid complications following surgery, here are some recommendations in order to help you during your recovery period.  Colace (docusate) - Pick up an over-the-counter form of Colace or another stool softener and take twice a day as long as you are requiring postoperative pain medications.  Take with a full glass of water daily.  If you experience loose stools or diarrhea, hold the colace until you stool forms back up.  If your symptoms do not get better within 1 week or if they get worse, check with your doctor.  Dulcolax (bisacodyl) - Pick up over-the-counter and take as directed by the product packaging as needed to assist with the movement of your bowels.  Take with a full glass of water.  Use this product as needed if not relieved by Colace only.   MiraLax (polyethylene glycol) - Pick up over-the-counter to have on hand.  MiraLax is a solution that will increase the amount of water in your bowels to assist with bowel movements.  Take as directed and can mix with a glass of water, juice, soda, coffee, or tea.  Take if you go more than two days without a movement. Do not use MiraLax more than once per day.   Call your doctor if you are still constipated or irregular after using this medication for 7 days in a row.  If you continue to have problems with postoperative constipation, please contact the office for further assistance and recommendations.  If you experience "the worst abdominal pain ever" or develop nausea or vomiting, please contact the office immediatly for further recommendations for  treatment.   CALL THE OFFICE FOR: Temperature above 101 degrees Excessive bleeding or drainage on the dressing. Excessive swelling, coldness, or paleness of the toes. Persistent nausea and vomiting.  FOLLOW-UP:  You should have an appointment to return to the office in 14 days after surgery. Arrangements have been made for continuation of Physical Therapy (either home therapy or outpatient therapy).  

## 2022-09-27 NOTE — Progress Notes (Signed)
Physical Therapy Treatment Patient Details Name: George Schmidt MRN: [id number] DOB: 15-Jul-1950 Today's Date: 09/27/2022   History of Present Illness Pt is a 72 yo male s/p L TKA. PMH of sleep apnea, HTN, afib, HA, CVA, DM.    PT Comments    Pt alert, agreeable to PT with 7/10 L knee pain. minA for bed mobility (at pt request, not necessary). CGA for sit <> Stand with RW and CGA for ambulation (~160ft). No LOB noted, antalgic gait noted but pt with step through pattern. The patient would benefit from further skilled PT intervention to continue to progress towards goals.    Recommendations for follow up therapy are one component of a multi-disciplinary discharge planning process, led by the attending physician.  Recommendations may be updated based on patient status, additional functional criteria and insurance authorization.  Follow Up Recommendations       Assistance Recommended at Discharge Frequent or constant Supervision/Assistance  Patient can return home with the following A little help with walking and/or transfers;Assistance with cooking/housework;Direct supervision/assist for medications management;Assist for transportation;Help with stairs or ramp for entrance;Assistance with feeding;A little help with bathing/dressing/bathroom   Equipment Recommendations  None recommended by PT    Recommendations for Other Services       Precautions / Restrictions Precautions Precautions: Fall;Knee Precaution Booklet Issued: Yes (comment) Restrictions Weight Bearing Restrictions: Yes LLE Weight Bearing: Weight bearing as tolerated     Mobility  Bed Mobility Overal bed mobility: Needs Assistance Bed Mobility: Supine to Sit     Supine to sit: Min assist     General bed mobility comments: reliance on bed rails, asked for a handheld assist, not necessarily required    Transfers Overall transfer level: Needs assistance Equipment used: Rolling walker (2 wheels) Transfers: Sit  to/from Stand, Bed to chair/wheelchair/BSC Sit to Stand: Supervision                Ambulation/Gait Ambulation/Gait assistance: Min guard Gait Distance (Feet): 100 Feet Assistive device: Rolling walker (2 wheels)         General Gait Details: antalgic gait, but noted for step through   Stairs             Wheelchair Mobility    Modified Rankin (Stroke Patients Only)       Balance Overall balance assessment: Needs assistance Sitting-balance support: Feet supported Sitting balance-Leahy Scale: Good     Standing balance support: No upper extremity supported, During functional activity Standing balance-Leahy Scale: Good                              Cognition Arousal/Alertness: Awake/alert Behavior During Therapy: WFL for tasks assessed/performed Overall Cognitive Status: Within Functional Limits for tasks assessed                                          Exercises Total Joint Exercises Heel Slides: AROM, Left, Strengthening, 20 reps Long Arc Quad: AROM, Strengthening, Left, 10 reps    General Comments        Pertinent Vitals/Pain Pain Assessment Pain Assessment: 0-10 Pain Score: 7  Pain Location: L knee Pain Descriptors / Indicators: Aching, Grimacing, Sore Pain Intervention(s): Limited activity within patient's tolerance, Monitored during session, Repositioned    Home Living Family/patient expects to be discharged to:: Private residence Living Arrangements: Spouse/significant other Available Help at Discharge:  Family Type of Home: Mobile home Home Access: Ramped entrance       Home Layout: One level Home Equipment: Rolling Walker (2 wheels);BSC/3in1;Grab bars - tub/shower;Shower seat      Prior Function            PT Goals (current goals can now be found in the care plan section) Progress towards PT goals: Progressing toward goals    Frequency    BID      PT Plan Current plan remains  appropriate    Co-evaluation              AM-PAC PT "6 Clicks" Mobility   Outcome Measure  Help needed turning from your back to your side while in a flat bed without using bedrails?: A Little Help needed moving from lying on your back to sitting on the side of a flat bed without using bedrails?: A Little Help needed moving to and from a bed to a chair (including a wheelchair)?: A Little Help needed standing up from a chair using your arms (e.g., wheelchair or bedside chair)?: A Little Help needed to walk in hospital room?: A Little Help needed climbing 3-5 steps with a railing? : A Little 6 Click Score: 18    End of Session Equipment Utilized During Treatment: Gait belt Activity Tolerance: Patient tolerated treatment well Patient left: with chair alarm set;in chair;with call bell/phone within reach Nurse Communication: Mobility status PT Visit Diagnosis: Other abnormalities of gait and mobility (R26.89);Muscle weakness (generalized) (M62.81);Difficulty in walking, not elsewhere classified (R26.2)     Time: 1610-9604 PT Time Calculation (min) (ACUTE ONLY): 16 min  Charges:  $Therapeutic Exercise: 8-22 mins                     Olga Coaster PT, DPT 11:01 AM,09/27/22

## 2022-09-27 NOTE — Progress Notes (Addendum)
Subjective: 1 Day Post-Op Procedure(s) (LRB): TOTAL KNEE ARTHROPLASTY-RNFA (Left) Patient reports pain as moderate.   Patient is well, and has had no acute complaints or problems Plan is to go Home after hospital stay. Negative for chest pain and shortness of breath Fever: no Gastrointestinal:Negative for nausea and vomiting Reports he is urinating well and he has had a BM since surgery.  Objective: Vital signs in last 24 hours: Temp:  [97.1 F (36.2 C)-98.3 F (36.8 C)] 97.9 F (36.6 C) (05/17 0606) Pulse Rate:  [56-99] 90 (05/17 0606) Resp:  [0-21] 20 (05/17 0606) BP: (87-129)/(60-85) 125/84 (05/17 0606) SpO2:  [92 %-98 %] 94 % (05/17 0606)  Intake/Output from previous day:  Intake/Output Summary (Last 24 hours) at 09/27/2022 0740 Last data filed at 09/26/2022 2113 Gross per 24 hour  Intake 1600 ml  Output 750 ml  Net 850 ml    Intake/Output this shift: No intake/output data recorded.  Labs: Recent Labs    09/27/22 0703  HGB 12.4*   Recent Labs    09/27/22 0703  WBC 15.5*  RBC 3.89*  HCT 35.2*  PLT 238   Recent Labs    09/27/22 0703  NA 138  K 3.2*  CL 108  CO2 21*  BUN 34*  CREATININE 1.74*  GLUCOSE 139*  CALCIUM 9.0   No results for input(s): "LABPT", "INR" in the last 72 hours.   EXAM General - Patient is Alert, Appropriate, and Oriented Extremity - Neurovascular intact Dorsiflexion/Plantar flexion intact Incision: dressing C/D/I No cellulitis present Dressing/Incision - clean, dry, no drainage noted to the left knee honeycomb. Motor Function - intact, moving foot and toes well on exam.  Abdomen distended, intact bowel sounds this morning.  Past Medical History:  Diagnosis Date   A-fib [facility])    a.) CHA2DS2VASc = 5 (age, HTN, CVA x 2, T2DM);  b.) rate/rhythm maintained on oral beta blocker (atenolol); chronically anticoagulated with apixaban   Acute ischemic right middle cerebral artery (MCA) stroke (HCC) 07/18/2021   a.) CT head and  MRI brain 07/18/2021: acute or subacute RIGHT anterior MCA territory infarct. 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 shoulder latera. Superficial and nodular.    Basal cell carcinoma 05/25/2020   L upper back lateral - ED&C   Basal cell carcinoma 05/25/2020   L shoulder posterior adjacent to white patch - ED&C   Basal cell carcinoma 05/25/2020   L upper medial back - ED&C   Basal cell carcinoma 04/10/2022   Left upper back. Superficial and nodular. EDC done 06/19/22   Basal cell carcinoma 04/10/2022   Mid back. Superficial and nodular. EDC done 06/19/22   Bradycardia    CKD (chronic kidney disease), stage III (HCC)    Dental bridge present    permanent, bottom   Diastolic dysfunction    a.) TTE 07/19/2021: EF 50-55%, no RWMAs, G2DD   Diverticulosis    Erectile dysfunction    a.) on PDE5i (sildenafil)   GERD (gastroesophageal reflux disease)    Headache    Hemorrhoids    History  of bilateral cataract extraction 03/2022   Hyperlipidemia    Hypertension    Long term current use of anticoagulant    a.) apixaban   Sleep apnea    a.) not using nocturnal PAP therapy   Squamous cell carcinoma of skin 04/27/2020   R temple - MOHS 06/16/20 at [facility]   T2DM (type 2 diabetes mellitus) (HCC)     Assessment/Plan: 1 Day Post-Op Procedure(s) (LRB): TOTAL KNEE ARTHROPLASTY-RNFA (Left) Principal Problem:   S/P TKR (total knee replacement) using cement, left  Estimated body mass index is 34.56 kg/m as calculated from the following:   Height as of this encounter: 5\' 9"  (1.753 m).   Weight  as of this encounter: 106.1 kg. Advance diet Up with therapy D/C IV fluids when tolerating po intake.  Labs and vitals reviewed, Hg 12.4.  WBC 15.5, no recent fevers. K+ 3.2 this morning, will supplement this AM. Up with therapy today. Plan for discharge home today pending progress with PT.  DVT Prophylaxis - TED hose and SCDs and Eliquis Weight-Bearing as tolerated to left leg  [name], PA-C [facility] Orthopaedic Surgery 09/27/2022, 7:40 AM   Patient seen and examined, agree with above plan.  The patient is doing well status post left total knee arthroplasty, no concerns at this time.  Pain is controlled.  Discussed DVT prophylaxis, pain medication use, and safe transition to home.  All questions answered the patient agrees with above plan will go home after clears PT.   George Schmidt

## 2022-09-27 NOTE — Plan of Care (Signed)
Patient ready for discharge per provider. Patient has no equipment needs at this time. Patient Progressing well, ready for discharge.  Problem: Education: Goal: Ability to describe self-care measures that may prevent or decrease complications (Diabetes Survival Skills Education) will improve Outcome: Adequate for Discharge Goal: Individualized Educational Video(s) Outcome: Adequate for Discharge   Problem: Coping: Goal: Ability to adjust to condition or change in health will improve Outcome: Adequate for Discharge   Problem: Fluid Volume: Goal: Ability to maintain a balanced intake and output will improve Outcome: Adequate for Discharge   Problem: Health Behavior/Discharge Planning: Goal: Ability to identify and utilize available resources and services will improve Outcome: Adequate for Discharge Goal: Ability to manage health-related needs will improve Outcome: Adequate for Discharge   Problem: Metabolic: Goal: Ability to maintain appropriate glucose levels will improve Outcome: Adequate for Discharge   Problem: Nutritional: Goal: Maintenance of adequate nutrition will improve Outcome: Adequate for Discharge Goal: Progress toward achieving an optimal weight will improve Outcome: Adequate for Discharge   Problem: Skin Integrity: Goal: Risk for impaired skin integrity will decrease Outcome: Adequate for Discharge   Problem: Tissue Perfusion: Goal: Adequacy of tissue perfusion will improve Outcome: Adequate for Discharge   Problem: Education: Goal: Knowledge of the prescribed therapeutic regimen will improve Outcome: Adequate for Discharge Goal: Individualized Educational Video(s) Outcome: Adequate for Discharge   Problem: Activity: Goal: Ability to avoid complications of mobility impairment will improve Outcome: Adequate for Discharge Goal: Range of joint motion will improve Outcome: Adequate for Discharge   Problem: Clinical Measurements: Goal: Postoperative  complications will be avoided or minimized Outcome: Adequate for Discharge   Problem: Pain Management: Goal: Pain level will decrease with appropriate interventions Outcome: Adequate for Discharge   Problem: Skin Integrity: Goal: Will show signs of wound healing Outcome: Adequate for Discharge

## 2022-09-27 NOTE — Anesthesia Postprocedure Evaluation (Signed)
Anesthesia Post Note  Patient: Rylo Ficker  Procedure(s) Performed: TOTAL KNEE ARTHROPLASTY-RNFA (Left: Knee)  Patient location during evaluation: Nursing Unit Anesthesia Type: Spinal Level of consciousness: oriented and awake and alert Pain management: pain level controlled Vital Signs Assessment: post-procedure vital signs reviewed and stable Respiratory status: spontaneous breathing and respiratory function stable Cardiovascular status: blood pressure returned to baseline and stable Postop Assessment: no headache, no backache, no apparent nausea or vomiting and patient able to bend at knees Anesthetic complications: no   No notable events documented.   Last Vitals:  Vitals:   09/27/22 0249 09/27/22 0606  BP: 121/83 125/84  Pulse: 97 90  Resp: 18 20  Temp: 36.8 C 36.6 C  SpO2: 95% 94%    Last Pain:  Vitals:   09/27/22 0606  TempSrc:   PainSc: 5                  Ceniya Fowers B Alonza Smoker

## 2023-05-01 ENCOUNTER — Other Ambulatory Visit: Payer: Self-pay | Admitting: Orthopedic Surgery

## 2023-05-13 ENCOUNTER — Encounter
Admission: RE | Admit: 2023-05-13 | Discharge: 2023-05-13 | Disposition: A | Discharge status: Home / Self Care | Payer: Medicare Other | Source: Ambulatory Visit | Attending: Orthopedic Surgery | Admitting: Orthopedic Surgery

## 2023-05-13 ENCOUNTER — Other Ambulatory Visit: Payer: Self-pay

## 2023-05-13 VITALS — BP 146/76 | HR 57 | Temp 97.5°F | Resp 18 | Ht 69.0 in | Wt 237.5 lb

## 2023-05-13 DIAGNOSIS — E119 Type 2 diabetes mellitus without complications: Secondary | ICD-10-CM | POA: Diagnosis not present

## 2023-05-13 DIAGNOSIS — Z01812 Encounter for preprocedural laboratory examination: Secondary | ICD-10-CM

## 2023-05-13 DIAGNOSIS — Z01818 Encounter for other preprocedural examination: Secondary | ICD-10-CM | POA: Insufficient documentation

## 2023-05-13 HISTORY — DX: Cardiac arrhythmia, unspecified: I49.9

## 2023-05-13 LAB — CBC WITH DIFFERENTIAL/PLATELET
Abs Immature Granulocytes: 0.03 10*3/uL (ref 0.00–0.07)
Basophils Absolute: 0 10*3/uL (ref 0.0–0.1)
Basophils Relative: 1 %
Eosinophils Absolute: 0.2 10*3/uL (ref 0.0–0.5)
Eosinophils Relative: 4 %
HCT: 39 % (ref 39.0–52.0)
Hemoglobin: 13.8 g/dL (ref 13.0–17.0)
Immature Granulocytes: 1 %
Lymphocytes Relative: 25 %
Lymphs Abs: 1.5 10*3/uL (ref 0.7–4.0)
MCH: 31.9 pg (ref 26.0–34.0)
MCHC: 35.4 g/dL (ref 30.0–36.0)
MCV: 90.3 fL (ref 80.0–100.0)
Monocytes Absolute: 0.5 10*3/uL (ref 0.1–1.0)
Monocytes Relative: 8 %
Neutro Abs: 3.7 10*3/uL (ref 1.7–7.7)
Neutrophils Relative %: 61 %
Platelets: 247 10*3/uL (ref 150–400)
RBC: 4.32 MIL/uL (ref 4.22–5.81)
RDW: 12.5 % (ref 11.5–15.5)
WBC: 6 10*3/uL (ref 4.0–10.5)
nRBC: 0 % (ref 0.0–0.2)

## 2023-05-13 LAB — COMPREHENSIVE METABOLIC PANEL
ALT: 20 U/L (ref 0–44)
AST: 24 U/L (ref 15–41)
Albumin: 4.2 g/dL (ref 3.5–5.0)
Alkaline Phosphatase: 31 U/L — ABNORMAL LOW (ref 38–126)
Anion gap: 12 (ref 5–15)
BUN: 30 mg/dL — ABNORMAL HIGH (ref 8–23)
CO2: 23 mmol/L (ref 22–32)
Calcium: 9.3 mg/dL (ref 8.9–10.3)
Chloride: 102 mmol/L (ref 98–111)
Creatinine, Ser: 1.74 mg/dL — ABNORMAL HIGH (ref 0.61–1.24)
GFR, Estimated: 41 mL/min — ABNORMAL LOW (ref >60)
Glucose, Bld: 225 mg/dL — ABNORMAL HIGH (ref 70–99)
Potassium: 3.6 mmol/L (ref 3.5–5.1)
Sodium: 137 mmol/L (ref 135–145)
Total Bilirubin: 0.9 mg/dL (ref 0.0–1.2)
Total Protein: 7 g/dL (ref 6.5–8.1)

## 2023-05-13 LAB — URINALYSIS, ROUTINE W REFLEX MICROSCOPIC
Bacteria, UA: NONE SEEN
Bilirubin Urine: NEGATIVE
Glucose, UA: NEGATIVE mg/dL
Hgb urine dipstick: NEGATIVE
Ketones, ur: NEGATIVE mg/dL
Leukocytes,Ua: NEGATIVE
Nitrite: NEGATIVE
Protein, ur: 30 mg/dL — AB
Specific Gravity, Urine: 1.021 (ref 1.005–1.030)
pH: 6 (ref 5.0–8.0)

## 2023-05-13 LAB — SURGICAL PCR SCREEN
MRSA, PCR: NEGATIVE
Staphylococcus aureus: NEGATIVE

## 2023-05-13 NOTE — Patient Instructions (Addendum)
 Your procedure is scheduled nw:Uylmdijb 05/22/23 Report to the Registration Desk on the 1st floor of the [facility]. To find out your arrival time, please call [phone] between 1PM - 3PM on: Wednesday 05/21/23 If your arrival time is 6:00 am, do not arrive before that time as the [facility] entrance doors do not open until 6:00 am.  REMEMBER: Instructions that are not followed completely may result in serious medical risk, up to and including death; or upon the discretion of your surgeon and anesthesiologist your surgery may need to be rescheduled.  Do not eat food after midnight the night before surgery.  No gum chewing or hard candies.  You may however, drink CLEAR liquids up to 2 hours before you are scheduled to arrive for your surgery. Do not drink anything within 2 hours of your scheduled arrival time.  Clear liquids include: - water   **Type 1 and Type 2 diabetics should only drink water.**  In addition, your doctor has ordered for you to drink the provided:  Gatorade G2 Drinking this carbohydrate drink up to two hours before surgery helps to reduce insulin  resistance and improve patient outcomes. Please complete drinking 2 hours before scheduled arrival time.  One week prior to surgery: Stop Anti-inflammatories (NSAIDS) such as Advil, Aleve, Ibuprofen, Motrin, Naproxen, Naprosyn and Aspirin  based products such as Excedrin, Goody's Powder, BC Powder. Stop ANY OVER THE COUNTER supplements until after surgery.  You may however, continue to take Tylenol  if needed for pain up until the day of surgery.  Stop metFORMIN  (GLUCOPHAGE -XR) 500 MG 2 days before your surgery (take last dose Monday 05/19/23) Stop sildenafil (VIAGRA) 100 MG 2 days before your procedure. (Do not take it after Monday 16/25)  Contact doctor's office and ask when to stop warfarin (COUMADIN) 3 MG before your surgery.    Continue taking all of your other prescription medications up until the day of  surgery.  ON THE DAY OF SURGERY ONLY TAKE THESE MEDICATIONS WITH SIPS OF WATER:  amLODipine  (NORVASC ) 10 MG  atenolol  (TENORMIN ) 25 MG  atorvastatin  (LIPITOR) 40 MG  chlorthalidone  (HYGROTON ) 25 MG    No Alcohol for 24 hours before or after surgery.  No Smoking including e-cigarettes for 24 hours before surgery.  No chewable tobacco products for at least 6 hours before surgery.  No nicotine patches on the day of surgery.  Do not use any recreational drugs for at least a week (preferably 2 weeks) before your surgery.  Please be advised that the combination of cocaine and anesthesia may have negative outcomes, up to and including death. If you test positive for cocaine, your surgery will be cancelled.  On the morning of surgery brush your teeth with toothpaste and water, you may rinse your mouth with mouthwash if you wish. Do not swallow any toothpaste or mouthwash.  Use CHG Soap or wipes as directed on instruction sheet.  Do not wear jewelry, make-up, hairpins, clips or nail polish.  For welded (permanent) jewelry: bracelets, anklets, waist bands, etc.  Please have this removed prior to surgery.  If it is not removed, there is a chance that hospital personnel will need to cut it off on the day of surgery.  Do not wear lotions, powders, or perfumes.   Do not shave body hair from the neck down 48 hours before surgery.  Contact lenses, hearing aids and dentures may not be worn into surgery.  Do not bring valuables to the hospital. [facility] is not responsible for  any missing/lost belongings or valuables.   Total Shoulder Arthroplasty:  use Benzoyl Peroxide 5% Gel as directed on instruction sheet.  Bring your C-PAP to the hospital in case you may have to spend the night.   Notify your doctor if there is any change in your medical condition (cold, fever, infection).  Wear comfortable clothing (specific to your surgery type) to the hospital.  After surgery, you can help  prevent lung complications by doing breathing exercises.  Take deep breaths and cough every 1-2 hours. Your doctor may order a device called an Incentive Spirometer to help you take deep breaths. When coughing or sneezing, hold a pillow firmly against your incision with both hands. This is called "splinting." Doing this helps protect your incision. It also decreases belly discomfort.  If you are being admitted to the hospital overnight, leave your suitcase in the car. After surgery it may be brought to your room.  In case of increased patient census, it may be necessary for you, the patient, to continue your postoperative care in the Same Day Surgery department.  If you are being discharged the day of surgery, you will not be allowed to drive home. You will need a responsible individual to drive you home and stay with you for 24 hours after surgery.   If you are taking public transportation, you will need to have a responsible individual with you.  Please call the Pre-admissions Testing Dept. at [phone] if you have any questions about these instructions.  Surgery Visitation Policy:  Patients having surgery or a procedure may have two visitors.  Children under the age of 82 must have an adult with them who is not the patient.  Inpatient Visitation:    Visiting hours are 7 a.m. to 8 p.m. Up to four visitors are allowed at one time in a patient room. The visitors may rotate out with other people during the day.  One visitor age 49 or older may stay with the patient overnight and must be in the room by 8 p.m.   Pre-operative 5 CHG Bath Instructions   You can play a key role in reducing the risk of infection after surgery. Your skin needs to be as free of germs as possible. You can reduce the number of germs on your skin by washing with CHG (chlorhexidine  gluconate) soap before surgery. CHG is an antiseptic soap that kills germs and continues to kill germs even after washing.   DO  NOT use if you have an allergy to chlorhexidine /CHG or antibacterial soaps. If your skin becomes reddened or irritated, stop using the CHG and notify one of our RNs at [phone].   Please shower with the CHG soap starting 4 days before surgery using the following schedule:     Please keep in mind the following:  DO NOT shave, including legs and underarms, starting the day of your first shower.   You may shave your face at any point before/day of surgery.  Place clean sheets on your bed the day you start using CHG soap. Use a clean washcloth (not used since being washed) for each shower. DO NOT sleep with pets once you start using the CHG.   CHG Shower Instructions:  If you choose to wash your hair and private area, wash first with your normal shampoo/soap.  After you use shampoo/soap, rinse your hair and body thoroughly to remove shampoo/soap residue.  Turn the water OFF and apply about 3 tablespoons (45 ml) of CHG soap to  a Animal Nutritionist.  Apply CHG soap ONLY FROM YOUR NECK DOWN TO YOUR TOES (washing for 3-5 minutes)  DO NOT use CHG soap on face, private areas, open wounds, or sores.  Pay special attention to the area where your surgery is being performed.  If you are having back surgery, having someone wash your back for you may be helpful. Wait 2 minutes after CHG soap is applied, then you may rinse off the CHG soap.  Pat dry with a clean towel  Put on clean clothes/pajamas   If you choose to wear lotion, please use ONLY the CHG-compatible lotions on the back of this paper.     Additional instructions for the day of surgery: DO NOT APPLY any lotions, deodorants, cologne, or perfumes.   Put on clean/comfortable clothes.  Brush your teeth.  Ask your nurse before applying any prescription medications to the skin.      CHG Compatible Lotions   Aveeno Moisturizing lotion  Cetaphil Moisturizing Cream  Cetaphil Moisturizing Lotion  Clairol Herbal Essence Moisturizing Lotion,  Dry Skin  Clairol Herbal Essence Moisturizing Lotion, Extra Dry Skin  Clairol Herbal Essence Moisturizing Lotion, Normal Skin  Curel Age Defying Therapeutic Moisturizing Lotion with Alpha Hydroxy  Curel Extreme Care Body Lotion  Curel Soothing Hands Moisturizing Hand Lotion  Curel Therapeutic Moisturizing Cream, Fragrance-Free  Curel Therapeutic Moisturizing Lotion, Fragrance-Free  Curel Therapeutic Moisturizing Lotion, Original Formula  Eucerin Daily Replenishing Lotion  Eucerin Dry Skin Therapy Plus Alpha Hydroxy Crme  Eucerin Dry Skin Therapy Plus Alpha Hydroxy Lotion  Eucerin Original Crme  Eucerin Original Lotion  Eucerin Plus Crme Eucerin Plus Lotion  Eucerin TriLipid Replenishing Lotion  Keri Anti-Bacterial Hand Lotion  Keri Deep Conditioning Original Lotion Dry Skin Formula Softly Scented  Keri Deep Conditioning Original Lotion, Fragrance Free Sensitive Skin Formula  Keri Lotion Fast Absorbing Fragrance Free Sensitive Skin Formula  Keri Lotion Fast Absorbing Softly Scented Dry Skin Formula  Keri Original Lotion  Keri Skin Renewal Lotion Keri Silky Smooth Lotion  Keri Silky Smooth Sensitive Skin Lotion  Nivea Body Creamy Conditioning Oil  Nivea Body Extra Enriched Lotion  Nivea Body Original Lotion  Nivea Body Sheer Moisturizing Lotion Nivea Crme  Nivea Skin Firming Lotion  NutraDerm 30 Skin Lotion  NutraDerm Skin Lotion  NutraDerm Therapeutic Skin Cream  NutraDerm Therapeutic Skin Lotion  ProShield Protective Hand Cream  Provon moisturizing lotionHow to Use an Facilities Manager  An incentive spirometer is a tool that measures how well you are filling your lungs with each breath. Learning to take long, deep breaths using this tool can help you keep your lungs clear and active. This may help to reverse or lessen your chance of developing breathing (pulmonary) problems, especially infection. You may be asked to use a spirometer: After a surgery. If you have a lung  problem or a history of smoking. After a long period of time when you have been unable to move or be active. If the spirometer includes an indicator to show the highest number that you have reached, your health care provider or respiratory therapist will help you set a goal. Keep a log of your progress as told by your health care provider. What are the risks? Breathing too quickly may cause dizziness or cause you to pass out. Take your time so you do not get dizzy or light-headed. If you are in pain, you may need to take pain medicine before doing incentive spirometry. It is harder to take a deep breath if  you are having pain. How to use your incentive spirometer  Sit up on the edge of your bed or on a chair. Hold the incentive spirometer so that it is in an upright position. Before you use the spirometer, breathe out normally. Place the mouthpiece in your mouth. Make sure your lips are closed tightly around it. Breathe in slowly and as deeply as you can through your mouth, causing the piston or the ball to rise toward the top of the chamber. Hold your breath for 3-5 seconds, or for as long as possible. If the spirometer includes a coach indicator, use this to guide you in breathing. Slow down your breathing if the indicator goes above the marked areas. Remove the mouthpiece from your mouth and breathe out normally. The piston or ball will return to the bottom of the chamber. Rest for a few seconds, then repeat the steps 10 or more times. Take your time and take a few normal breaths between deep breaths so that you do not get dizzy or light-headed. Do this every 1-2 hours when you are awake. If the spirometer includes a goal marker to show the highest number you have reached (best effort), use this as a goal to work toward during each repetition. After each set of 10 deep breaths, cough a few times. This will help to make sure that your lungs are clear. If you have an incision on your chest or  abdomen from surgery, place a pillow or a rolled-up towel firmly against the incision when you cough. This can help to reduce pain while taking deep breaths and coughing. General tips When you are able to get out of bed: Walk around often. Continue to take deep breaths and cough in order to clear your lungs. Keep using the incentive spirometer until your health care provider says it is okay to stop using it. If you have been in the hospital, you may be told to keep using the spirometer at home. Contact a health care provider if: You are having difficulty using the spirometer. You have trouble using the spirometer as often as instructed. Your pain medicine is not giving enough relief for you to use the spirometer as told. You have a fever. Get help right away if: You develop shortness of breath. You develop a cough with bloody mucus from the lungs. You have fluid or blood coming from an incision site after you cough. Summary An incentive spirometer is a tool that can help you learn to take long, deep breaths to keep your lungs clear and active. You may be asked to use a spirometer after a surgery, if you have a lung problem or a history of smoking, or if you have been inactive for a long period of time. Use your incentive spirometer as instructed every 1-2 hours while you are awake. If you have an incision on your chest or abdomen, place a pillow or a rolled-up towel firmly against your incision when you cough. This will help to reduce pain. Get help right away if you have shortness of breath, you cough up bloody mucus, or blood comes from your incision when you cough. This information is not intended to replace advice given to you by your health care provider. Make sure you discuss any questions you have with your health care provider. Document Revised: 07/19/2019 Document Reviewed: 07/19/2019 Elsevier Patient Education  2023 [employer].   Please go to the following website to access  important education materials concerning your upcoming joint  replacement.                                   [email or web address]

## 2023-05-15 NOTE — Progress Notes (Signed)
 Cardiac clearance faxed to Dr Juliann Pares for a-fib, aortic dilatation, cva and also recommendation on when pt needs to stop his Coumadin

## 2023-05-16 NOTE — Progress Notes (Signed)
 Allyson (surgery scheduler for [employer]) reached out to cardiology supervisor Duaine) since office closed at lunch and she reached out to Dr Florencio who was here at the hospital. Dr Florencio said that a 5 day hold of Coumadin is fine. Allyson said that cardiology told her they would reach out to pt and give him instructions on what Dr Florencio wants regarding his coumadin. I am still going to refax my cardiac clearance form on Monday (05-19-23) so we can have in writing what Dr Florencio has instructed pt on doing with his coumadin

## 2023-05-16 NOTE — Progress Notes (Signed)
 Cardiac clearance received on 05-15-23 from [name] NP from clearance that was faxed over from surgeons office Dela). Pt to stop Warfarin 2 days prior to surgery (last dose on 05-19-23) and start Eliquis  the evening of surgery (05-22-23)  Pt is optimized for surgery and is Moderate Risk

## 2023-05-16 NOTE — Progress Notes (Signed)
 Spoke with Dorise Pereyra NP questioning the 2 day hold of Warfarin for a TKA. Dorise Pereyra NP states that if pt is having neuraxial anesthesia as part of the surgical plan of care then it has to be a 5 day minimum hold. Dr Lorelle is offline until 05-19-23 so I cannot secure chat him regarding this. I called Allyson (surgery scheduler) for [employer]. I informed her of this and she is reaching back out to cardiologist to see if he will give permission for 5 day hold of Warfarin. Will continue to wait for response from [facility] office

## 2023-05-19 NOTE — Progress Notes (Addendum)
 Refaxed cardiac clearance form to Dr Juliann Pares  regarding when they instructed pt to stop his Coumadin

## 2023-05-22 ENCOUNTER — Other Ambulatory Visit: Payer: Self-pay

## 2023-05-22 ENCOUNTER — Observation Stay
Admission: RE | Admit: 2023-05-22 | Discharge: 2023-05-23 | Disposition: A | Discharge status: Home Health | Payer: Medicare Other | Attending: Orthopedic Surgery | Admitting: Orthopedic Surgery

## 2023-05-22 ENCOUNTER — Ambulatory Visit: Payer: Medicare Other | Admitting: Anesthesiology

## 2023-05-22 ENCOUNTER — Observation Stay: Payer: Medicare Other

## 2023-05-22 ENCOUNTER — Encounter
Admission: RE | Disposition: A | Discharge status: Home Health | Payer: Self-pay | Source: Home / Self Care | Attending: Orthopedic Surgery

## 2023-05-22 ENCOUNTER — Encounter: Payer: Self-pay | Admitting: Orthopedic Surgery

## 2023-05-22 DIAGNOSIS — Z7901 Long term (current) use of anticoagulants: Secondary | ICD-10-CM | POA: Insufficient documentation

## 2023-05-22 DIAGNOSIS — Z8673 Personal history of transient ischemic attack (TIA), and cerebral infarction without residual deficits: Secondary | ICD-10-CM | POA: Diagnosis not present

## 2023-05-22 DIAGNOSIS — Z7984 Long term (current) use of oral hypoglycemic drugs: Secondary | ICD-10-CM | POA: Diagnosis not present

## 2023-05-22 DIAGNOSIS — Z96652 Presence of left artificial knee joint: Secondary | ICD-10-CM | POA: Diagnosis not present

## 2023-05-22 DIAGNOSIS — Z79899 Other long term (current) drug therapy: Secondary | ICD-10-CM | POA: Insufficient documentation

## 2023-05-22 DIAGNOSIS — I4891 Unspecified atrial fibrillation: Secondary | ICD-10-CM | POA: Insufficient documentation

## 2023-05-22 DIAGNOSIS — Z85828 Personal history of other malignant neoplasm of skin: Secondary | ICD-10-CM | POA: Insufficient documentation

## 2023-05-22 DIAGNOSIS — M1711 Unilateral primary osteoarthritis, right knee: Principal | ICD-10-CM | POA: Insufficient documentation

## 2023-05-22 DIAGNOSIS — N1832 Chronic kidney disease, stage 3b: Secondary | ICD-10-CM | POA: Insufficient documentation

## 2023-05-22 DIAGNOSIS — E1122 Type 2 diabetes mellitus with diabetic chronic kidney disease: Secondary | ICD-10-CM | POA: Diagnosis not present

## 2023-05-22 DIAGNOSIS — Z8616 Personal history of COVID-19: Secondary | ICD-10-CM | POA: Insufficient documentation

## 2023-05-22 DIAGNOSIS — Z96651 Presence of right artificial knee joint: Principal | ICD-10-CM

## 2023-05-22 DIAGNOSIS — I129 Hypertensive chronic kidney disease with stage 1 through stage 4 chronic kidney disease, or unspecified chronic kidney disease: Secondary | ICD-10-CM | POA: Diagnosis not present

## 2023-05-22 DIAGNOSIS — Z01812 Encounter for preprocedural laboratory examination: Secondary | ICD-10-CM

## 2023-05-22 DIAGNOSIS — E119 Type 2 diabetes mellitus without complications: Secondary | ICD-10-CM

## 2023-05-22 HISTORY — PX: TOTAL KNEE ARTHROPLASTY: SHX125

## 2023-05-22 LAB — GLUCOSE, CAPILLARY
Glucose-Capillary: 118 mg/dL — ABNORMAL HIGH (ref 70–99)
Glucose-Capillary: 162 mg/dL — ABNORMAL HIGH (ref 70–99)
Glucose-Capillary: 243 mg/dL — ABNORMAL HIGH (ref 70–99)
Glucose-Capillary: 235 mg/dL — ABNORMAL HIGH (ref 70–99)

## 2023-05-22 LAB — PROTIME-INR
INR: 1.2 (ref 0.8–1.2)
Prothrombin Time: 15.8 s — ABNORMAL HIGH (ref 11.4–15.2)

## 2023-05-22 SURGERY — ARTHROPLASTY, KNEE, TOTAL
Anesthesia: Spinal | Site: Knee | Laterality: Right

## 2023-05-22 MED ORDER — BUPIVACAINE-EPINEPHRINE (PF) 0.25% -1:200000 IJ SOLN
INTRAMUSCULAR | Status: AC
  Filled 2023-05-22: qty 60

## 2023-05-22 MED ORDER — BUPIVACAINE LIPOSOME 1.3 % IJ SUSP
INTRAMUSCULAR | Status: AC
  Filled 2023-05-22: qty 40

## 2023-05-22 MED ORDER — FENTANYL CITRATE (PF) 100 MCG/2ML IJ SOLN
INTRAMUSCULAR | Status: DC | PRN
  Administered 2023-05-22 (×2): 25 ug via INTRAVENOUS

## 2023-05-22 MED ORDER — ACETAMINOPHEN 500 MG PO TABS
ORAL_TABLET | ORAL | Status: AC
  Filled 2023-05-22: qty 2

## 2023-05-22 MED ORDER — CHLORTHALIDONE 25 MG PO TABS
25.0000 mg | ORAL_TABLET | Freq: Every day | ORAL | Status: DC
  Filled 2023-05-22 (×2): qty 1

## 2023-05-22 MED ORDER — INSULIN ASPART 100 UNIT/ML IJ SOLN
INTRAMUSCULAR | Status: AC
  Filled 2023-05-22: qty 1

## 2023-05-22 MED ORDER — INSULIN ASPART 100 UNIT/ML IJ SOLN
0.0000 [IU] | Freq: Three times a day (TID) | INTRAMUSCULAR | Status: DC
  Administered 2023-05-22: 5 [IU] via SUBCUTANEOUS
  Administered 2023-05-23: 2 [IU] via SUBCUTANEOUS

## 2023-05-22 MED ORDER — TRANEXAMIC ACID-NACL 1000-0.7 MG/100ML-% IV SOLN
1000.0000 mg | INTRAVENOUS | Status: AC
  Administered 2023-05-22 (×2): 1000 mg via INTRAVENOUS

## 2023-05-22 MED ORDER — PHENYLEPHRINE HCL-NACL 20-0.9 MG/250ML-% IV SOLN
INTRAVENOUS | Status: DC | PRN
  Administered 2023-05-22: 40 ug/min via INTRAVENOUS

## 2023-05-22 MED ORDER — ACETAMINOPHEN 500 MG PO TABS
1000.0000 mg | ORAL_TABLET | Freq: Three times a day (TID) | ORAL | Status: DC
  Administered 2023-05-22 – 2023-05-23 (×3): 1000 mg via ORAL

## 2023-05-22 MED ORDER — DOCUSATE SODIUM 100 MG PO CAPS
ORAL_CAPSULE | ORAL | Status: AC
  Filled 2023-05-22: qty 1

## 2023-05-22 MED ORDER — FENTANYL CITRATE (PF) 100 MCG/2ML IJ SOLN
INTRAMUSCULAR | Status: AC
  Filled 2023-05-22: qty 2

## 2023-05-22 MED ORDER — MIDAZOLAM HCL 5 MG/5ML IJ SOLN
INTRAMUSCULAR | Status: DC | PRN
  Administered 2023-05-22: 2 mg via INTRAVENOUS

## 2023-05-22 MED ORDER — CHLORHEXIDINE GLUCONATE 0.12 % MT SOLN
OROMUCOSAL | Status: AC
  Filled 2023-05-22: qty 15

## 2023-05-22 MED ORDER — AMLODIPINE BESYLATE 10 MG PO TABS: 10.0000 mg | ORAL_TABLET | Freq: Every day | ORAL | Status: DC

## 2023-05-22 MED ORDER — PROPOFOL 10 MG/ML IV BOLUS
INTRAVENOUS | Status: DC | PRN
  Administered 2023-05-22: 50 mg via INTRAVENOUS

## 2023-05-22 MED ORDER — FENOFIBRATE 160 MG PO TABS
160.0000 mg | ORAL_TABLET | Freq: Every day | ORAL | Status: DC
  Filled 2023-05-22: qty 1

## 2023-05-22 MED ORDER — ATORVASTATIN CALCIUM 10 MG PO TABS
40.0000 mg | ORAL_TABLET | Freq: Every day | ORAL | Status: DC
  Administered 2023-05-23: 40 mg via ORAL

## 2023-05-22 MED ORDER — SURGIPHOR WOUND IRRIGATION SYSTEM - OPTIME: TOPICAL | Status: DC | PRN

## 2023-05-22 MED ORDER — PROPOFOL 500 MG/50ML IV EMUL
INTRAVENOUS | Status: DC | PRN
  Administered 2023-05-22: 100 ug/kg/min via INTRAVENOUS

## 2023-05-22 MED ORDER — CHLORHEXIDINE GLUCONATE 0.12 % MT SOLN
15.0000 mL | Freq: Once | OROMUCOSAL | Status: AC
  Administered 2023-05-22: 15 mL via OROMUCOSAL

## 2023-05-22 MED ORDER — SODIUM CHLORIDE 0.9 % IV SOLN: INTRAVENOUS | Status: DC

## 2023-05-22 MED ORDER — SODIUM CHLORIDE 0.9 % IR SOLN
Status: DC | PRN
  Administered 2023-05-22: 3000 mL

## 2023-05-22 MED ORDER — ATENOLOL 50 MG PO TABS
ORAL_TABLET | ORAL | Status: AC
Start: 2023-05-22 — End: ?
  Filled 2023-05-22: qty 1

## 2023-05-22 MED ORDER — ACETAMINOPHEN 325 MG PO TABS: 325.0000 mg | ORAL_TABLET | Freq: Four times a day (QID) | ORAL | Status: DC | PRN

## 2023-05-22 MED ORDER — EPHEDRINE SULFATE-NACL 50-0.9 MG/10ML-% IV SOSY
PREFILLED_SYRINGE | INTRAVENOUS | Status: DC | PRN
  Administered 2023-05-22: 5 mg via INTRAVENOUS

## 2023-05-22 MED ORDER — TRAMADOL HCL 50 MG PO TABS
50.0000 mg | ORAL_TABLET | Freq: Four times a day (QID) | ORAL | Status: DC | PRN
  Administered 2023-05-22 (×2): 50 mg via ORAL

## 2023-05-22 MED ORDER — PANTOPRAZOLE SODIUM 40 MG PO TBEC
DELAYED_RELEASE_TABLET | ORAL | Status: AC
  Filled 2023-05-22: qty 1

## 2023-05-22 MED ORDER — ATENOLOL 25 MG PO TABS
25.0000 mg | ORAL_TABLET | Freq: Every day | ORAL | Status: DC
  Administered 2023-05-22: 25 mg via ORAL

## 2023-05-22 MED ORDER — CEFAZOLIN SODIUM-DEXTROSE 2-4 GM/100ML-% IV SOLN
2.0000 g | INTRAVENOUS | Status: AC
  Administered 2023-05-22: 2 g via INTRAVENOUS

## 2023-05-22 MED ORDER — MENTHOL 3 MG MT LOZG: 1.0000 | LOZENGE | OROMUCOSAL | Status: DC | PRN

## 2023-05-22 MED ORDER — MIDAZOLAM HCL 2 MG/2ML IJ SOLN
INTRAMUSCULAR | Status: AC
  Filled 2023-05-22: qty 2

## 2023-05-22 MED ORDER — PROPOFOL 10 MG/ML IV BOLUS
INTRAVENOUS | Status: AC
  Filled 2023-05-22: qty 20

## 2023-05-22 MED ORDER — PANTOPRAZOLE SODIUM 40 MG PO TBEC
40.0000 mg | DELAYED_RELEASE_TABLET | Freq: Every day | ORAL | Status: DC
Start: 2023-05-22 — End: 2023-05-23
  Administered 2023-05-22 – 2023-05-23 (×2): 40 mg via ORAL

## 2023-05-22 MED ORDER — TRAMADOL HCL 50 MG PO TABS
ORAL_TABLET | ORAL | Status: AC
  Filled 2023-05-22: qty 1

## 2023-05-22 MED ORDER — PHENOL 1.4 % MT LIQD: 1.0000 | OROMUCOSAL | Status: DC | PRN

## 2023-05-22 MED ORDER — METOCLOPRAMIDE HCL 5 MG/ML IJ SOLN
5.0000 mg | Freq: Three times a day (TID) | INTRAMUSCULAR | Status: DC | PRN
Start: 2023-05-22 — End: 2023-05-23

## 2023-05-22 MED ORDER — KETOROLAC TROMETHAMINE 30 MG/ML IJ SOLN
INTRAMUSCULAR | Status: AC
  Filled 2023-05-22: qty 1

## 2023-05-22 MED ORDER — CEFAZOLIN SODIUM-DEXTROSE 2-4 GM/100ML-% IV SOLN
INTRAVENOUS | Status: AC
  Filled 2023-05-22: qty 100

## 2023-05-22 MED ORDER — EZETIMIBE 10 MG PO TABS
10.0000 mg | ORAL_TABLET | Freq: Every day | ORAL | Status: DC
  Administered 2023-05-23: 10 mg via ORAL
  Filled 2023-05-22: qty 1

## 2023-05-22 MED ORDER — DOCUSATE SODIUM 100 MG PO CAPS
100.0000 mg | ORAL_CAPSULE | Freq: Two times a day (BID) | ORAL | Status: DC
  Administered 2023-05-22 – 2023-05-23 (×3): 100 mg via ORAL

## 2023-05-22 MED ORDER — TRANEXAMIC ACID-NACL 1000-0.7 MG/100ML-% IV SOLN
INTRAVENOUS | Status: AC
  Filled 2023-05-22: qty 100

## 2023-05-22 MED ORDER — MORPHINE SULFATE (PF) 2 MG/ML IV SOLN
0.5000 mg | INTRAVENOUS | Status: DC | PRN
Start: 2023-05-22 — End: 2023-05-23

## 2023-05-22 MED ORDER — ORAL CARE MOUTH RINSE: 15.0000 mL | OROMUCOSAL | Status: DC | PRN

## 2023-05-22 MED ORDER — METOCLOPRAMIDE HCL 10 MG PO TABS: 5.0000 mg | ORAL_TABLET | Freq: Three times a day (TID) | ORAL | Status: DC | PRN

## 2023-05-22 MED ORDER — DEXAMETHASONE SODIUM PHOSPHATE 10 MG/ML IJ SOLN
INTRAMUSCULAR | Status: AC
  Filled 2023-05-22: qty 1

## 2023-05-22 MED ORDER — LIDOCAINE HCL (PF) 2 % IJ SOLN
INTRAMUSCULAR | Status: AC
  Filled 2023-05-22: qty 5

## 2023-05-22 MED ORDER — EPHEDRINE 5 MG/ML INJ
INTRAVENOUS | Status: AC
  Filled 2023-05-22: qty 5

## 2023-05-22 MED ORDER — FENTANYL CITRATE (PF) 100 MCG/2ML IJ SOLN
25.0000 ug | INTRAMUSCULAR | Status: DC | PRN
  Administered 2023-05-22: 25 ug via INTRAVENOUS
  Administered 2023-05-22: 50 ug via INTRAVENOUS

## 2023-05-22 MED ORDER — ONDANSETRON HCL 4 MG PO TABS: 4.0000 mg | ORAL_TABLET | Freq: Four times a day (QID) | ORAL | Status: DC | PRN

## 2023-05-22 MED ORDER — DEXAMETHASONE SODIUM PHOSPHATE 10 MG/ML IJ SOLN
8.0000 mg | Freq: Once | INTRAMUSCULAR | Status: AC
  Administered 2023-05-22: 10 mg via INTRAVENOUS

## 2023-05-22 MED ORDER — DOCUSATE SODIUM 100 MG PO CAPS
ORAL_CAPSULE | ORAL | Status: AC
Start: 2023-05-22 — End: ?
  Filled 2023-05-22: qty 1

## 2023-05-22 MED ORDER — SODIUM CHLORIDE FLUSH 0.9 % IV SOLN
INTRAVENOUS | Status: AC
  Filled 2023-05-22: qty 40

## 2023-05-22 MED ORDER — LIDOCAINE HCL (CARDIAC) PF 100 MG/5ML IV SOSY
PREFILLED_SYRINGE | INTRAVENOUS | Status: DC | PRN
  Administered 2023-05-22: 40 mg via INTRAVENOUS

## 2023-05-22 MED ORDER — ONDANSETRON HCL 4 MG/2ML IJ SOLN: 4.0000 mg | Freq: Four times a day (QID) | INTRAMUSCULAR | Status: DC | PRN

## 2023-05-22 MED ORDER — INSULIN ASPART 100 UNIT/ML IJ SOLN
0.0000 [IU] | Freq: Every day | INTRAMUSCULAR | Status: DC
  Administered 2023-05-22: 2 [IU] via SUBCUTANEOUS

## 2023-05-22 MED ORDER — ORAL CARE MOUTH RINSE: 15.0000 mL | Freq: Once | OROMUCOSAL | Status: AC

## 2023-05-22 MED ORDER — OXYCODONE HCL 5 MG PO TABS
5.0000 mg | ORAL_TABLET | ORAL | Status: DC | PRN
  Administered 2023-05-23 (×2): 5 mg via ORAL

## 2023-05-22 MED ORDER — CEFAZOLIN SODIUM-DEXTROSE 2-4 GM/100ML-% IV SOLN
2.0000 g | Freq: Four times a day (QID) | INTRAVENOUS | Status: AC
  Administered 2023-05-22 (×2): 2 g via INTRAVENOUS

## 2023-05-22 MED ORDER — PROPOFOL 1000 MG/100ML IV EMUL
INTRAVENOUS | Status: AC
  Filled 2023-05-22: qty 100

## 2023-05-22 MED ORDER — APIXABAN 2.5 MG PO TABS
5.0000 mg | ORAL_TABLET | Freq: Two times a day (BID) | ORAL | Status: DC
  Administered 2023-05-23: 5 mg via ORAL

## 2023-05-22 MED ORDER — BUPIVACAINE IN DEXTROSE 0.75-8.25 % IT SOLN
INTRATHECAL | Status: DC | PRN
  Administered 2023-05-22: 1.5 mL via INTRATHECAL

## 2023-05-22 MED ORDER — SODIUM CHLORIDE (PF) 0.9 % IJ SOLN
INTRAMUSCULAR | Status: DC | PRN
  Administered 2023-05-22: 70 mL via INTRAMUSCULAR

## 2023-05-22 SURGICAL SUPPLY — 69 items
BLADE CLIPPER SURG (BLADE) IMPLANT
BLADE PATELLA W-PILOT HOLE 35 (BLADE) IMPLANT
BLADE SAGITTAL AGGR TOOTH XLG (BLADE) IMPLANT
BLADE SAW SAG 25X90X1.19 (BLADE) ×1 IMPLANT
BLADE SAW SAG 29X58X.64 (BLADE) ×1 IMPLANT
BNDG ELASTIC 6INX 5YD STR LF (GAUZE/BANDAGES/DRESSINGS) ×1 IMPLANT
BOWL CEMENT MIX W/ADAPTER (MISCELLANEOUS) ×1 IMPLANT
BRUSH SCRUB EZ PLAIN DRY (MISCELLANEOUS) IMPLANT
CEMENT BONE R 1X40 (Cement) ×2 IMPLANT
CHLORAPREP W/TINT 26 (MISCELLANEOUS) ×2 IMPLANT
COMP FEM CMT PERSONA SZ10 RT (Joint) ×1 IMPLANT
COMPONENT FEM CMT PRNSA SZ10RT (Joint) IMPLANT
COOLER POLAR GLACIER W/PUMP (MISCELLANEOUS) ×1 IMPLANT
CUFF TRNQT CYL 24X4X16.5-23 (TOURNIQUET CUFF) IMPLANT
CUFF TRNQT CYL 30X4X21-28X (TOURNIQUET CUFF) IMPLANT
DERMABOND ADVANCED .7 DNX12 (GAUZE/BANDAGES/DRESSINGS) ×1 IMPLANT
DRAPE SHEET LG 3/4 BI-LAMINATE (DRAPES) ×1 IMPLANT
DRSG MEPILEX SACRM 8.7X9.8 (GAUZE/BANDAGES/DRESSINGS) ×1 IMPLANT
DRSG OPSITE POSTOP 4X10 (GAUZE/BANDAGES/DRESSINGS) IMPLANT
DRSG OPSITE POSTOP 4X8 (GAUZE/BANDAGES/DRESSINGS) IMPLANT
ELECT REM PT RETURN 9FT ADLT (ELECTROSURGICAL) ×1
ELECTRODE REM PT RTRN 9FT ADLT (ELECTROSURGICAL) ×1 IMPLANT
GLOVE BIO SURGEON STRL SZ8 (GLOVE) ×1 IMPLANT
GLOVE BIOGEL PI IND STRL 8 (GLOVE) ×1 IMPLANT
GLOVE PI ORTHO PRO STRL 7.5 (GLOVE) ×2 IMPLANT
GLOVE PI ORTHO PRO STRL SZ8 (GLOVE) ×2 IMPLANT
GLOVE SURG SYN 7.5 E (GLOVE) ×1
GLOVE SURG SYN 7.5 PF PI (GLOVE) ×1 IMPLANT
GOWN SRG XL LVL 3 NONREINFORCE (GOWNS) ×1 IMPLANT
GOWN STRL REUS W/ TWL LRG LVL3 (GOWN DISPOSABLE) ×1 IMPLANT
GOWN STRL REUS W/ TWL XL LVL3 (GOWN DISPOSABLE) ×1 IMPLANT
HOOD PEEL AWAY T7 (MISCELLANEOUS) ×2 IMPLANT
INSERT TIBIAL PERSONA RT 11 (Joint) IMPLANT
IV NS IRRIG 3000ML ARTHROMATIC (IV SOLUTION) ×1 IMPLANT
KIT TURNOVER KIT A (KITS) ×1 IMPLANT
MANIFOLD NEPTUNE II (INSTRUMENTS) ×1 IMPLANT
MARKER SKIN DUAL TIP RULER LAB (MISCELLANEOUS) ×1 IMPLANT
MAT ABSORB FLUID 56X50 GRAY (MISCELLANEOUS) ×1 IMPLANT
NDL HYPO 21X1.5 SAFETY (NEEDLE) ×1 IMPLANT
NEEDLE HYPO 21X1.5 SAFETY (NEEDLE) ×1
PACK TOTAL KNEE (MISCELLANEOUS) ×1 IMPLANT
PAD ARMBOARD 7.5X6 YLW CONV (MISCELLANEOUS) ×3 IMPLANT
PAD WRAPON POLAR KNEE (MISCELLANEOUS) ×1 IMPLANT
PENCIL SMOKE EVACUATOR (MISCELLANEOUS) ×1 IMPLANT
PIN DRILL HDLS TROCAR 75 4PK (PIN) IMPLANT
PULSAVAC PLUS IRRIG FAN TIP (DISPOSABLE) ×1
SCREW FEMALE HEX FIX 25X2.5 (ORTHOPEDIC DISPOSABLE SUPPLIES) IMPLANT
SCREW HEX HEADED 3.5X27 DISP (ORTHOPEDIC DISPOSABLE SUPPLIES) IMPLANT
SLEEVE SCD COMPRESS KNEE MED (STOCKING) ×1 IMPLANT
SOLUTION IRRIG SURGIPHOR (IV SOLUTION) ×1 IMPLANT
STEM POLY PAT PLY 35M KNEE (Knees) IMPLANT
STEM TIB ST PERS 14+30 (Stem) IMPLANT
STEM TIBIA 5 DEG SZ G R KNEE (Knees) IMPLANT
SUT STRATA 1 CT-1 DLB (SUTURE) ×1
SUT STRATAFIX 14 PDO 48 VLT (SUTURE) ×1 IMPLANT
SUT STRATAFIX PDO 1 14 VIOLET (SUTURE) ×1
SUT VIC AB 0 CT1 36 (SUTURE) ×1 IMPLANT
SUT VIC AB 2-0 CT2 27 (SUTURE) ×2 IMPLANT
SUT VICRYL 1-0 27IN ABS (SUTURE) ×1
SUTURE STRATA SPIR 4-0 18 (SUTURE) ×1 IMPLANT
SUTURE VICRYL 1-0 27IN ABS (SUTURE) ×1 IMPLANT
SYR 30ML LL (SYRINGE) ×2 IMPLANT
TAPE CLOTH 3X10 WHT NS LF (GAUZE/BANDAGES/DRESSINGS) ×1 IMPLANT
TIBIA STEM 5 DEG SZ G R KNEE (Knees) ×1 IMPLANT
TIP FAN IRRIG PULSAVAC PLUS (DISPOSABLE) ×1 IMPLANT
TOWEL OR 17X26 4PK STRL BLUE (TOWEL DISPOSABLE) IMPLANT
TRAP FLUID SMOKE EVACUATOR (MISCELLANEOUS) ×1 IMPLANT
WATER STERILE IRR 1000ML POUR (IV SOLUTION) ×1 IMPLANT
WRAPON POLAR PAD KNEE (MISCELLANEOUS) ×1

## 2023-05-22 NOTE — Evaluation (Signed)
 Physical Therapy Evaluation Patient Details Name: George Schmidt MRN: [id number] DOB: Sep 26, 1950 Today's Date: 05/22/2023  History of Present Illness  George Schmidt is a 72yoM who presents for Rt TKA. PMH: left TKA 2023, OSA, HTN, AF, CVA, DM.  Clinical Impression  Pt in bed on entry, agreeable to evaluation, pain at 2/10 on arrival. Pt has orthostatic hypotension coming from supine to sitting EOB, no symptoms until he AMB ~56ft c RW. Pt requires minA for rise to standing, but no assist for bed mobility, nor any assist for AMB at bedside. Pt is able to move leg freely without pain restriction, however is more limited in weight bearing tolerance. Pt up to chair at end of session. Will plan on advancing AMB distance next day and issue HEP handout.       If plan is discharge home, recommend the following: A lot of help with walking and/or transfers;Assist for transportation;Assistance with cooking/housework;Help with stairs or ramp for entrance   Can travel by private vehicle        Equipment Recommendations None recommended by PT (pt has RW and 3n1)  Recommendations for Other Services       Functional Status Assessment Patient has had a recent decline in their functional status and demonstrates the ability to make significant improvements in function in a reasonable and predictable amount of time.     Precautions / Restrictions Precautions Precautions: Fall;Knee Restrictions Weight Bearing Restrictions Per Provider Order: Yes RLE Weight Bearing Per Provider Order: Weight bearing as tolerated      Mobility  Bed Mobility Overal bed mobility: Needs Assistance Bed Mobility: Supine to Sit     Supine to sit: Modified independent (Device/Increase time)     General bed mobility comments: limited more by core weakness than by operative pain; no assist needed, but requires considerable effort    Transfers Overall transfer level: Needs assistance Equipment used: Rolling walker  (2 wheels) Transfers: Sit to/from Stand Sit to Stand: Min assist           General transfer comment: 3 attempts, all require ~ minA    Ambulation/Gait Ambulation/Gait assistance: Contact guard assist Gait Distance (Feet): 28 Feet Assistive device: Rolling walker (2 wheels) Gait Pattern/deviations: Step-to pattern, Step-through pattern       General Gait Details: begins to feel giddy and unsafe to go farther  [employer] Mobility     Tilt Bed    Modified Rankin (Stroke Patients Only)       Balance Overall balance assessment: Modified Independent, History of Falls                                           Pertinent Vitals/Pain Pain Assessment Pain Assessment: 0-10 Pain Score: 2  Pain Location: Rt knee Pain Descriptors / Indicators: Aching, Operative site guarding Pain Intervention(s): Limited activity within patient's tolerance, Monitored during session, Premedicated before session, Repositioned    Home Living Family/patient expects to be discharged to:: Private residence Living Arrangements: Spouse/significant other Available Help at Discharge: Family Type of Home: Mobile home Home Access: Ramped entrance       Home Layout: One level Home Equipment: [employer] (2 wheels);BSC/3in1;Grab bars - tub/shower;Shower seat      Prior Function Prior Level of Function : Independent/Modified Independent  Extremity/Trunk Assessment                Communication      Cognition                                                General Comments      Exercises Total Joint Exercises Straight Leg Raises: AROM, Supine Knee Flexion: AROM, Seated Goniometric ROM: Rt knee ROM: ~18-95 degrees   Assessment/Plan    PT Assessment Patient needs continued PT services  PT Problem List Decreased strength;Decreased range of motion;Decreased activity tolerance;Decreased  mobility       PT Treatment Interventions DME instruction;Gait training;Patient/family education;Stair training;Functional mobility training;Therapeutic activities;Therapeutic exercise;Balance training    PT Goals (Current goals can be found in the Care Plan section)  Acute Rehab PT Goals Patient Stated Goal: DC 1/10 early prior to inclement weather PT Goal Formulation: With patient Time For Goal Achievement: 06/05/23 Potential to Achieve Goals: Good    Frequency BID     Co-evaluation               AM-PAC PT 6 Clicks Mobility  Outcome Measure Help needed turning from your back to your side while in a flat bed without using bedrails?: A Little Help needed moving from lying on your back to sitting on the side of a flat bed without using bedrails?: A Little Help needed moving to and from a bed to a chair (including a wheelchair)?: A Little Help needed standing up from a chair using your arms (e.g., wheelchair or bedside chair)?: A Little Help needed to walk in hospital room?: A Little Help needed climbing 3-5 steps with a railing? : A Lot 6 Click Score: 17    End of Session Equipment Utilized During Treatment: Gait belt Activity Tolerance: Patient tolerated treatment well;No increased pain;Treatment limited secondary to medical complications (Comment) (orthostatic) Patient left: in chair;with call bell/phone within reach Nurse Communication: Mobility status PT Visit Diagnosis: Difficulty in walking, not elsewhere classified (R26.2);Other abnormalities of gait and mobility (R26.89)    Time: 1421-1500 PT Time Calculation (min) (ACUTE ONLY): 39 min   Charges:   PT Evaluation $PT Eval Low Complexity: 1 Low PT Treatments $Gait Training: 8-22 mins PT General Charges $$ ACUTE PT VISIT: 1 Visit        3:20 PM, 05/22/23 Peggye JAYSON Linear, PT, DPT Physical Therapist - [facility]  [phone] (ASCOM)    Shamell Suarez C 05/22/2023,  3:17 PM

## 2023-05-22 NOTE — Transfer of Care (Signed)
 Immediate Anesthesia Transfer of Care Note  Patient: George Schmidt  Procedure(s) Performed: TOTAL KNEE ARTHROPLASTY (Right: Knee)  Patient Location: PACU  Anesthesia Type:Spinal  Level of Consciousness: drowsy and patient cooperative  Airway & Oxygen Therapy: Patient Spontanous Breathing  Post-op Assessment: Report given to RN and Post -op Vital signs reviewed and stable  Post vital signs: stable  Last Vitals:  Vitals Value Taken Time  BP    Temp    Pulse 58 05/22/23 1011  Resp 16 05/22/23 1011  SpO2 93 % 05/22/23 1011  Vitals shown include unfiled device data.  Last Pain:  Vitals:   05/22/23 0625  PainSc: 1          Complications: No notable events documented.

## 2023-05-22 NOTE — Discharge Instructions (Addendum)
 [facility] They will call you to set up when they are coming out to see you   [address], [place], [place] [address] Hours:  Open ? Closes 5?PM Phone: [phone]         Instructions after Total Knee Replacement   Arthea Sheer M.D.     Dept. of Orthopaedics & Sports Medicine  [facility]  [address]  [place], [place]  [address]  Phone: [phone]   Fax: [phone]    DIET: Drink plenty of non-alcoholic fluids. Resume your normal diet. Include foods high in fiber.  ACTIVITY:  You may use crutches or a walker with weight-bearing as tolerated, unless instructed otherwise. You may be weaned off of the walker or crutches by your Physical Therapist.  Do NOT place pillows under the knee. Anything placed under the knee could limit your ability to straighten the knee.   Continue doing gentle exercises. Exercising will reduce the pain and swelling, increase motion, and prevent muscle weakness.   Please continue to use the TED compression stockings for 2 weeks. You may remove the stockings at night, but should reapply them in the morning. Do not drive or operate any equipment until instructed.  WOUND CARE:  Continue to use the PolarCare or ice packs periodically to reduce pain and swelling. You may begin showering 3 days after surgery with honeycomb dressing. Remove honeycomb dressing 7 days after surgery and continue showering. Allow dermabond to fall off on its own.  MEDICATIONS: You may resume your regular medications. Please take the pain medication as prescribed on the medication. Do not take pain medication on an empty stomach. Do not drive or drink alcoholic beverages when taking pain medications.  POSTOPERATIVE CONSTIPATION PROTOCOL Constipation - defined medically as fewer than three stools per week and severe constipation as less than one stool per week.  One of the most common issues patients have following surgery is constipation.  Even if you  have a regular bowel pattern at home, your normal regimen is likely to be disrupted due to multiple reasons following surgery.  Combination of anesthesia, postoperative narcotics, change in appetite and fluid intake all can affect your bowels.  In order to avoid complications following surgery, here are some recommendations in order to help you during your recovery period.  Colace (docusate) - Pick up an over-the-counter form of Colace or another stool softener and take twice a day as long as you are requiring postoperative pain medications.  Take with a full glass of water daily.  If you experience loose stools or diarrhea, hold the colace until you stool forms back up.  If your symptoms do not get better within 1 week or if they get worse, check with your doctor.  Dulcolax (bisacodyl) - Pick up over-the-counter and take as directed by the product packaging as needed to assist with the movement of your bowels.  Take with a full glass of water.  Use this product as needed if not relieved by Colace only.   MiraLax (polyethylene glycol) - Pick up over-the-counter to have on hand.  MiraLax is a solution that will increase the amount of water in your bowels to assist with bowel movements.  Take as directed and can mix with a glass of water, juice, soda, coffee, or tea.  Take if you go more than two days without a movement. Do not use MiraLax more than once per day. Call your doctor if you are still constipated or irregular after using this medication for  7 days in a row.  If you continue to have problems with postoperative constipation, please contact the office for further assistance and recommendations.  If you experience the worst abdominal pain ever or develop nausea or vomiting, please contact the office immediatly for further recommendations for treatment.   CALL THE OFFICE FOR: Temperature above 101 degrees Excessive bleeding or drainage on the dressing. Excessive swelling, coldness, or paleness  of the toes. Persistent nausea and vomiting.  FOLLOW-UP:  You should have an appointment to return to the office in 14 days after surgery. Arrangements have been made for continuation of Physical Therapy (either home therapy or outpatient therapy).

## 2023-05-22 NOTE — Anesthesia Preprocedure Evaluation (Signed)
 Anesthesia Evaluation  Patient identified by MRN, date of birth, ID band Patient awake    Reviewed: Allergy & Precautions, NPO status , Patient's Chart, lab work & pertinent test results  History of Anesthesia Complications Negative for: history of anesthetic complications  Airway Mallampati: III  TM Distance: >3 FB Neck ROM: Full    Dental  (+) Teeth Intact, Loose,    Pulmonary neg shortness of breath, sleep apnea , neg COPD, neg recent URI, Patient abstained from smoking.Not current smoker, former smoker   Pulmonary exam normal breath sounds clear to auscultation       Cardiovascular Exercise Tolerance: Poor METShypertension, Pt. on medications (-) angina (-) CAD and (-) Past MI + dysrhythmias Atrial Fibrillation  Rhythm:Irregular Rate:Normal - Systolic murmurs    Neuro/Psych  Headaches Residual cognitive deficits CVA, No Residual Symptoms  negative psych ROS   GI/Hepatic ,neg GERD  ,,(+)     (-) substance abuse    Endo/Other  diabetes, Oral Hypoglycemic Agents    Renal/GU CRFRenal disease     Musculoskeletal   Abdominal   Peds  Hematology   Anesthesia Other Findings Past Medical History: No date: Arthritis 08/25/2019: Basal cell carcinoma     Comment:  Left ant. shoulder latera. Superficial and nodular.  05/25/2020: Basal cell carcinoma     Comment:  L upper back lateral - ED&C 05/25/2020: Basal cell carcinoma     Comment:  L shoulder posterior adjacent to white patch - ED&C 05/25/2020: Basal cell carcinoma     Comment:  L upper medial back - ED&C No date: Dental bridge present     Comment:  permanent, bottom No date: Diabetes mellitus without complication (HCC) No date: Headache No date: Hemorrhoids No date: Hyperlipidemia No date: Hypertension 04/27/2020: Squamous cell carcinoma of skin     Comment:  R temple - [facility] 06/16/20 at [facility] No date: Stroke [facility])     Comment:  07/18/21   11/30/19  Reproductive/Obstetrics                             Anesthesia Physical Anesthesia Plan  ASA: 3  Anesthesia Plan: Spinal   Post-op Pain Management: Ofirmev  IV (intra-op)*   Induction: Intravenous  PONV Risk Score and Plan: 1 and Ondansetron , Dexamethasone , Propofol  infusion, TIVA and Midazolam   Airway Management Planned: Natural Airway and Simple Face Mask  Additional Equipment: None  Intra-op Plan:   Post-operative Plan:   Informed Consent: I have reviewed the patients History and Physical, chart, labs and discussed the procedure including the risks, benefits and alternatives for the proposed anesthesia with the patient or authorized representative who has indicated his/her understanding and acceptance.       Plan Discussed with: CRNA and Surgeon  Anesthesia Plan Comments: (Discussed R/B/A of neuraxial anesthesia technique with patient: - rare risks of spinal/epidural hematoma, nerve damage, infection - Risk of PDPH - Risk of nausea and vomiting - Risk of conversion to general anesthesia and its associated risks, including sore throat, damage to lips/eyes/teeth/oropharynx, and rare risks such as cardiac and respiratory events. - Risk of allergic reactions  Discussed the role of CRNA in patient's perioperative care.  Patient voiced understanding.)       Anesthesia Quick Evaluation

## 2023-05-22 NOTE — Interval H&P Note (Signed)
Patient history and physical updated. Consent reviewed including risks, benefits, and alternatives to surgery. Patient agrees with above plan to proceed with right total knee arthroplasty  

## 2023-05-22 NOTE — Op Note (Signed)
 Patient Name: George Schmidt  FMW:[id number]  Pre-Operative Diagnosis: Right knee Osteoarthritis  Post-Operative Diagnosis: (same)  Procedure: Right Total Knee Arthroplasty  Components/Implants: Femur: Persona Size 10 CR   Tibia: Persona Size G w/ 14x61mm stem extension  Poly: 11mm MC  Patella: 35x68mm symmetric  Femoral Valgus Cut Angle: 5 degrees  Distal Femoral Re-cut: none  Patella Resurfacing: yes   Date of Surgery: 05/22/2023  Surgeon: Arthea Sheer MD  Assistant: Debby Amber PA (present and scrubbed throughout the case, critical for assistance with exposure, retraction, instrumentation, and closure)   Anesthesiologist: Dario  Anesthesia: Spinal   Tourniquet Time: 61 min  EBL: 50cc  IVF: 1000cc  Complications: None   Brief history: The patient is a 73 year old male with a history of osteoarthritis of the right knee with pain limiting their range of motion and activities of daily living, which has failed multiple attempts at conservative therapy.  The risks and benefits of total knee arthroplasty as definitive surgical treatment were discussed with the patient, who opted to proceed with the operation.  After outpatient medical clearance and optimization was completed the patient was admitted to [facility] for the procedure.  All preoperative films were reviewed and an appropriate surgical plan was made prior to surgery. Preoperative range of motion was 5 to 120 with a 5 flexion contracture.   Description of procedure: The patient was brought to the operating room where laterality was confirmed by all those present to be the right side.   Spinal anesthesia was administered and the patient received an intravenous dose of antibiotics for surgical prophylaxis and a dose of tranexamic acid .  Patient is positioned supine on the operating room table with all bony prominences well-padded.  A well-padded tourniquet was applied to the right thigh.  The  knee was then prepped and draped in usual sterile fashion with multiple layers of adhesive and nonadhesive drapes.  All of those present in the operating room participated in a surgical timeout laterality and patient were confirmed.   An Esmarch was wrapped around the extremity and the leg was elevated and the knee flexed.  The tourniquet was inflated to a pressure of 250 mmHg. The Esmarch was removed and the leg was brought down to full extension.  The patella and tibial tubercle identified and outlined using a marking pen and a midline skin incision was made with a knife carried through the subcutaneous tissue down to the extensor retinaculum.  After exposure of the extensor mechanism the medial parapatellar arthrotomy was performed with a scalpel and electrocautery extending down medial and distal to the tibial tubercle taking care to avoid incising the patellar tendon.   A standard medial release was performed over the proximal tibia.  The knee was brought into extension in order to excise the fat pad taking care not to damage the patella tendon.  The superior soft tissue was removed from the anterior surface of the distal femur to visualize for the procedure.  The knee was then brought into flexion with the patella subluxed laterally and subluxing the tibia anteriorly.  The ACL was transected and removed with electrocautery and additional soft tissue was removed from the proximal surface of the tibia to fully expose. The PCL was found to be intact and was preserved.  An extramedullary tibial cutting guide was then applied to the leg with a spring-loaded ankle clamp placed around the distal tibia just above the malleoli the angulation of the guide was adjusted to give some posterior slope  in the tibial resection with an appropriate varus/valgus alignment.  The resection guide was then pinned to the proximal tibia and the proximal tibial surface was resected with an oscillating saw.  Careful attention was  paid to ensure the blade did not disrupt any of the soft tissues including any lateral or medial ligament.  After removal of the tibial cut the posterior medial corner was evaluated and a Baker's cyst was encountered and there was an ossified body deep and posterior within the Baker's cyst which we elected to leave to avoid unnecessary soft tissue dissection along the posterior medial corner.  Attention was then turned to the femur, with the knee slightly flexed a opening drill was used to enter the medullary canal of the femur.  After removing the drill marrow was suctioned out to decompress the distal femur.  An intramedullary femoral guide was then inserted into the drill hole and the alignment guide was seated firmly against the distal end of the medial femoral condyle.  The distal femoral cutting guide was then attached and pinned securely to the anterior surface of the femur and the intramedullary rod and alignment guide was removed.  Distal femur resection was then performed with an oscillating saw with retractors protecting medial and laterally.   The distal cutting block was then removed and the extension gap was checked with a spacer.  Extension gap was found to be appropriately sized to accommodate the spacer block.   The femoral sizing guide was then placed securely into the posterior condyles of the femur and the femoral size was measured and determined to be 10.  The size 10; 4-in-1 cutting guide was placed in position and secured with 2 pins.  The anterior posterior and chamfer resections were then performed with an oscillating saw.  Bony fragments and osteophytes were then removed.  Using a lamina spreader the posterior medial and lateral condyles were checked for additional osteophytes and posterior soft tissue remnants.  Any remaining meniscus was removed at this time.  Periarticular injection was performed in the meniscal rims and posterior capsule with aspiration performed to ensure no  intravascular injection.   The tibia was then exposed and the tibial trial was pinned onto the plateau after confirming appropriate orientation and rotation.  Using the drill bushing the tibia was prepared to the appropriate drill depth.  Tibial broach impactor was then driven through the punch guide using a mallet.  The femoral trial component was then inserted onto the femur. A trial tibial polyethylene bearing was then placed and the knee was reduced.  The knee achieved full extension with no hyperextension and was found to be balanced in flexion and extension with the trials in place.  The knee was then brought into full extension the patella was everted and held with 2 Kocher clamps.  The articular surface of the patella was then resected with an patella reamer and saw after careful measurement with a caliper.  The patella was then prepared with the drill guide and a trial patella was placed.  The knee was then taken through range of motion and it was found that the patella articulated appropriately with the trochlea and good patellofemoral motion without subluxation.    The correct final components for implantation were confirmed and opened by the circulator nurse.  The prepared surfaces of the patella femur and tibia were cleaned with pulsatile lavage to remove all blood fat and other material and then the surfaces were dried.  2 bags of cement were  mixed under vacuum and the components were cemented into place.  Excess cement was removed with curettes and forceps. A trial polyethylene tibial component was placed and the knee was brought into extension to allow the cement to set.  At this time the periarticular injection cocktail was placed in the soft tissues surrounding the knee.  After full curing of the cement the balance of the knee was checked again and the final polyethylene size was confirmed. The tibial component was irrigated and locking mechanism checked to ensure it was clear of debris. The  real polyethylene tibial component was implanted and the knee was brought through a range of motion.   The knee was then irrigated with copious amount of normal saline via pulsatile lavage to remove all loose bodies and other debris.  The knee was then irrigated with surgiphor betadine based wash and reirrigated with saline.  The tourniquet was then dropped and all bleeding vessels were identified and coagulated.  The arthrotomy was approximated with #1 Vicryl and closed with #1 Stratafix suture.  The knee was brought into slight flexion and the subcutaneous tissues were closed with 0 Vicryl, 2-0 Vicryl and a running subcuticular 4-0 stratafix barbed suture.  Skin was then glued with Dermabond.  A sterile adhesive dressing was then placed along with a sequential compression device to the calf, a Ted stocking, and a cryotherapy cuff.   Sponge, needle, and Lap counts were all correct at the end of the case.   The patient was transferred off of the operating room table to a hospital bed, good pulses were found distally on the operative side.  The patient was transferred to the recovery room in stable condition.

## 2023-05-22 NOTE — H&P (Signed)
 HPI:  George Schmidt is a 73 y.o. male who presents for history and physical for right total knee arthroplasty with Dr. Lorelle on 05/22/2023. Patient has advanced right knee osteoarthritis complete loss of joint space in the medial compartment with tricompartmental arthritic changes present. Patient has had years of increasing pain that is significantly affecting his quality of life and activities of daily living. Pain is sharp during the day and aching at nighttime. Pain is 7 out of 10 his knee will buckle and give way on him making him feel as if he is going to fall. He is unable to walk long distances. He has had conservative treatment consisting of injections with initially some relief but recently has not provided him with adequate long-term relief. Patient has had successful left total knee arthroplasty.  Patient is a non-smoker with a BMI 34.6 and is well-controlled diabetic with last A1c of 6.2. Patient has atrial fibrillation and currently on Coumadin, Coumadin will be held 5 days prior to surgery and after surgery patient will start on Eliquis .  Current Outpatient Medications  Medication Sig Dispense Refill  ACCU-CHEK FASTCLIX LANCET DRUM CHECK BLOOD SUGAR 3 TIMES DAILY 300 each 3  ACCU-CHEK GUIDE TEST STRIPS test strip USE TO CHECK FASTING BLOOD SUGAR TWICE DAILY 200 strip 2  acetaminophen  (TYLENOL ) 500 MG tablet Take 1,000 mg by mouth every 8 (eight) hours  amLODIPine  (NORVASC ) 10 MG tablet TAKE 1 TABLET BY MOUTH ONCE DAILY 100 tablet 2  apixaban  (ELIQUIS ) 5 mg tablet Take 1 tablet (5 mg total) by mouth every 12 (twelve) hours 180 tablet 3  atenoloL  (TENORMIN ) 25 MG tablet TAKE 1 TABLET BY MOUTH ONCE DAILY 100 tablet 2  atorvastatin  (LIPITOR) 80 MG tablet Take 1 tablet (80 mg total) by mouth once daily 90 tablet 3  calcium  carbonate (TUMS) 200 mg calcium  (500 mg) chewable tablet Take 2 tablets by mouth as needed for Heartburn  chlorthalidone  25 MG tablet TAKE 1 TABLET BY MOUTH ONCE DAILY  100 tablet 2  ezetimibe  (ZETIA ) 10 mg tablet Take 1 tablet (10 mg total) by mouth once daily 90 tablet 3  fenofibrate  160 MG tablet TAKE 1 TABLET BY MOUTH ONCE DAILY 100 tablet 2  metFORMIN  (GLUCOPHAGE -XR) 500 MG XR tablet TAKE 2 TABLETS BY MOUTH ONCE DAILY 200 tablet 2  potassium chloride  (KLOR-CON  M20) 20 MEQ ER tablet TAKE 1 TABLET BY MOUTH TWICE DAILY 200 tablet 2  sildenafiL (VIAGRA) 100 MG tablet Take 1 tablet (100 mg total) by mouth once daily as needed for Erectile Dysfunction 90 tablet 3  tiZANidine (ZANAFLEX) 2 MG tablet Take 1 tablet (2 mg total) by mouth 3 (three) times daily as needed 20 tablet 0  traMADoL  (ULTRAM ) 50 mg tablet Take 1 tablet (50 mg total) by mouth every 6 (six) hours as needed for Pain 30 tablet 0   No current facility-administered medications for this visit.   Allergies  Allergen Reactions  Amoxicillin Hives  Pt took this and clarithromycin for H-pylori back in 2013 developed a rash was not sure which med caused it  Clarithromycin Hives  Pt took this and amoxicillin for H-pylori back in 2013 developed a rash was not sure which med caused it  Propranolol Rash   Past Medical History:  Diagnosis Date  Arthritis 08/18/2013  Diabetes mellitus type 2, uncomplicated (CMS/HHS-HCC)  Diverticulosis 10/21/2014  GERD (gastroesophageal reflux disease)  History of cancer skin  History of hemorrhoids 08/18/2013  History of stroke 12/01/2019  Hyperlipidemia  Hypertension  Internal hemorrhoids  10/21/2014  Persistent atrial fibrillation (CMS/HHS-HCC) 03/19/2023  Stage 3b chronic kidney disease (CKD) (CMS/HHS-HCC) 07/18/2021   Past Surgical History:  Procedure Laterality Date  COLONOSCOPY 10/21/2014  Diverticulosis/Otherwise normal/Repeat 14yrs/MGR  ARTHROPLASTY TOTAL KNEE Left 09/26/2022  by Dr. Lorelle   Family History  Problem Relation Name Age of Onset  Coronary Artery Disease (Blocked arteries around heart) Mother 36  Alcohol abuse Father  Pneumonia Father  No  Known Problems Daughter  No Known Problems Daughter   Social History   Socioeconomic History  Marital status: Married  Tobacco Use  Smoking status: Never  Smokeless tobacco: Never  Substance and Sexual Activity  Alcohol use: Yes  Alcohol/week: 0.0 standard drinks of alcohol  Comment: Occasionally  Drug use: No  Sexual activity: Yes  Partners: Female  Birth control/protection: None   Social Drivers of Health   Food Insecurity: No Food Insecurity (09/26/2022)  Received [facility]  Hunger Vital Sign  Worried About Running Out of Food in the Last Year: Never true  Ran Out of Food in the Last Year: Never true  Transportation Needs: No Transportation Needs (09/26/2022)  Received from [facility] - Transportation  Lack of Transportation (Medical): No  Lack of Transportation (Non-Medical): No   Review of Systems:  A comprehensive 14 point ROS was performed, reviewed, and the pertinent orthopaedic findings are documented in the HPI.  Exam: Vitals:  05/20/23 1020  BP: 118/70  Weight: (!) 107.5 kg (237 lb)  Height: 175.3 cm (5' 9)  PainSc: 2  PainLoc: Knee  General:  Well developed, well nourished, no apparent distress, normal affect, normal gait with no antalgic component.   HEENT: Head normocephalic, atraumatic, PERRL.   Abdomen: Soft, non tender, non distended, Bowel sounds present.  Heart: Examination of the heart reveals regular, rate, and rhythm. There is no murmur noted on ascultation. There is a normal apical pulse.  Lungs: Lungs are clear to auscultation. There is no wheeze, rhonchi, or crackles. There is normal expansion of bilateral chest walls.   Right lower extremity: Examination of the right lower extremity shows 5 to 120 degrees range of motion with crepitation along the patellofemoral compartment. He is stable with valgus and varus stress testing. Patella tracking well. He is able to actively straight leg raise. No pain with hip  internal/external rotation. There is no warmth redness or edema throughout the lower extremity. Negative Homans' sign. He is neuro vas intact and able to dorsiflex and plantarflex toes.  4 view x-rays of the right knee performed on 08/20/2022 images reviewed by myself show severe medial and patellofemoral degenerative changes in the right knee with ossified loose bodies, sclerosis, osteophyte formation. Kellgren-Lawrence grade 4.  Assessment: Encounter Diagnoses  Name Primary?  Primary osteoarthritis of right knee Yes  Severe obesity (BMI 35.0-39.9) with comorbidity (CMS/HHS-HCC)  Type 2 diabetes mellitus with stage 3a chronic kidney disease, without long-term current use of insulin  (CMS/HHS-HCC)  Right knee osteoarthritis  Plan: Gianluca Chhim is a 73 year old male with advanced right knee osteoarthritis. Patient has complete loss of joint space in the medial compartment of the right knee with loose bodies and tricompartmental arthritic changes. Pain is significantly interfering with his quality of life and activities daily living. Risks, benefits, complications of a right total knee arthroplasty have been discussed with the patient. Patient has agreed and consented procedure with Dr. Lorelle on 05/22/2023.  The hospitalization and post-operative care and rehabilitation were also discussed. The use of perioperative antibiotics and DVT prophylaxis were discussed.  The risk, benefits and alternatives to a surgical intervention were discussed at length with the patient. The patient was also advised of risks related to the medical comorbidities and elevated body mass index (BMI). A lengthy discussion took place to review the most common complications including but not limited to: stiffness, loss of function, complex regional pain syndrome, deep vein thrombosis, pulmonary embolus, heart attack, stroke, infection, wound breakdown, numbness, intraoperative fracture, damage to nerves, tendon,muscles, arteries  or other blood vessels, death and other possible complications from anesthesia. The patient was told that we will take steps to minimize these risks by using sterile technique, antibiotics and DVT prophylaxis when appropriate and follow the patient postoperatively in the office setting to monitor progress. The possibility of recurrent pain, no improvement in pain and actual worsening of pain were also discussed with the patient.   All questions answered patient agrees with above plan for right total knee arthroplasty.

## 2023-05-22 NOTE — Anesthesia Procedure Notes (Signed)
 Spinal  Patient location during procedure: OR Start time: 05/22/2023 7:50 AM End time: 05/22/2023 8:11 AM Reason for block: surgical anesthesia Staffing Anesthesiologist: Dario Barter, MD Resident/CRNA: Elly Pfeiffer, CRNA Performed by: Elly Pfeiffer, CRNA Authorized by: Dario Barter, MD   Preanesthetic Checklist Completed: patient identified, IV checked, site marked, risks and benefits discussed, surgical consent, monitors and equipment checked, pre-op evaluation and timeout performed Spinal Block Patient position: sitting Prep: DuraPrep Patient monitoring: heart rate, cardiac monitor, continuous pulse ox and blood pressure Approach: midline Location: L3-4 Injection technique: single-shot Needle Needle type: Sprotte  Needle gauge: 24 G Needle length: 9 cm Assessment Sensory level: T4 Events: CSF return Additional Notes Multiple attempts by CRNA and MDA; Dr. Dario with success & return of clear, free flowing CSF, no paresthesias. CA

## 2023-05-23 DIAGNOSIS — M1711 Unilateral primary osteoarthritis, right knee: Secondary | ICD-10-CM | POA: Diagnosis not present

## 2023-05-23 LAB — BASIC METABOLIC PANEL
Anion gap: 12 (ref 5–15)
BUN: 31 mg/dL — ABNORMAL HIGH (ref 8–23)
CO2: 21 mmol/L — ABNORMAL LOW (ref 22–32)
Calcium: 9.1 mg/dL (ref 8.9–10.3)
Chloride: 101 mmol/L (ref 98–111)
Creatinine, Ser: 1.68 mg/dL — ABNORMAL HIGH (ref 0.61–1.24)
GFR, Estimated: 43 mL/min — ABNORMAL LOW (ref >60)
Glucose, Bld: 176 mg/dL — ABNORMAL HIGH (ref 70–99)
Potassium: 3.1 mmol/L — ABNORMAL LOW (ref 3.5–5.1)
Sodium: 134 mmol/L — ABNORMAL LOW (ref 135–145)

## 2023-05-23 LAB — CBC
HCT: 33.3 % — ABNORMAL LOW (ref 39.0–52.0)
Hemoglobin: 12 g/dL — ABNORMAL LOW (ref 13.0–17.0)
MCH: 32.3 pg (ref 26.0–34.0)
MCHC: 36 g/dL (ref 30.0–36.0)
MCV: 89.5 fL (ref 80.0–100.0)
Platelets: 241 10*3/uL (ref 150–400)
RBC: 3.72 MIL/uL — ABNORMAL LOW (ref 4.22–5.81)
RDW: 12.3 % (ref 11.5–15.5)
WBC: 15.3 10*3/uL — ABNORMAL HIGH (ref 4.0–10.5)
nRBC: 0 % (ref 0.0–0.2)

## 2023-05-23 LAB — GLUCOSE, CAPILLARY: Glucose-Capillary: 150 mg/dL — ABNORMAL HIGH (ref 70–99)

## 2023-05-23 MED ORDER — TRAMADOL HCL 50 MG PO TABS: 50.0000 mg | ORAL_TABLET | Freq: Four times a day (QID) | ORAL | 0 refills | Status: AC | PRN

## 2023-05-23 MED ORDER — PANTOPRAZOLE SODIUM 40 MG PO TBEC
DELAYED_RELEASE_TABLET | ORAL | Status: AC
  Filled 2023-05-23: qty 1

## 2023-05-23 MED ORDER — DOCUSATE SODIUM 100 MG PO CAPS: 100.0000 mg | ORAL_CAPSULE | Freq: Two times a day (BID) | ORAL | 0 refills | Status: AC

## 2023-05-23 MED ORDER — ACETAMINOPHEN 500 MG PO TABS: 1000.0000 mg | ORAL_TABLET | Freq: Three times a day (TID) | ORAL | 0 refills | Status: AC

## 2023-05-23 MED ORDER — EZETIMIBE 10 MG PO TABS
ORAL_TABLET | ORAL | Status: AC
  Filled 2023-05-23: qty 1

## 2023-05-23 MED ORDER — DOCUSATE SODIUM 100 MG PO CAPS
ORAL_CAPSULE | ORAL | Status: AC
  Filled 2023-05-23: qty 1

## 2023-05-23 MED ORDER — AMLODIPINE BESYLATE 10 MG PO TABS: 10.0000 mg | ORAL_TABLET | Freq: Every day | ORAL | Status: DC

## 2023-05-23 MED ORDER — ONDANSETRON HCL 4 MG PO TABS: 4.0000 mg | ORAL_TABLET | Freq: Four times a day (QID) | ORAL | 0 refills | Status: AC | PRN

## 2023-05-23 MED ORDER — ACETAMINOPHEN 500 MG PO TABS
ORAL_TABLET | ORAL | Status: AC
  Filled 2023-05-23: qty 2

## 2023-05-23 MED ORDER — AMLODIPINE BESYLATE 5 MG PO TABS
ORAL_TABLET | ORAL | Status: AC
  Filled 2023-05-23: qty 2

## 2023-05-23 MED ORDER — POTASSIUM CHLORIDE 20 MEQ PO PACK
20.0000 meq | PACK | Freq: Three times a day (TID) | ORAL | Status: DC
  Administered 2023-05-23: 20 meq via ORAL
  Filled 2023-05-23: qty 1

## 2023-05-23 MED ORDER — ATORVASTATIN CALCIUM 20 MG PO TABS
ORAL_TABLET | ORAL | Status: AC
  Filled 2023-05-23: qty 2

## 2023-05-23 MED ORDER — OXYCODONE HCL 5 MG PO TABS
ORAL_TABLET | ORAL | Status: AC
  Filled 2023-05-23: qty 1

## 2023-05-23 MED ORDER — OXYCODONE HCL 5 MG PO TABS: 2.5000 mg | ORAL_TABLET | Freq: Four times a day (QID) | ORAL | 0 refills | Status: AC | PRN

## 2023-05-23 MED ORDER — INSULIN ASPART 100 UNIT/ML IJ SOLN
INTRAMUSCULAR | Status: AC
  Filled 2023-05-23: qty 1

## 2023-05-23 MED ORDER — APIXABAN 2.5 MG PO TABS
ORAL_TABLET | ORAL | Status: AC
  Filled 2023-05-23: qty 2

## 2023-05-23 MED ORDER — POTASSIUM CHLORIDE CRYS ER 20 MEQ PO TBCR: 20.0000 meq | EXTENDED_RELEASE_TABLET | Freq: Every day | ORAL | 0 refills | Status: AC

## 2023-05-23 NOTE — Plan of Care (Signed)
 documented

## 2023-05-23 NOTE — Progress Notes (Signed)
 Subjective: 1 Day Post-Op Procedure(s) (LRB): TOTAL KNEE ARTHROPLASTY (Right) Patient reports pain as moderate.   Patient is well, and has had no acute complaints or problems Denies any CP, SOB, ABD pain. We will continue therapy today.  Plan is to go Home after hospital stay.  Objective: Vital signs in last 24 hours: Temp:  [97.2 F (36.2 C)-98.6 F (37 C)] 98.6 F (37 C) (01/10 0744) Pulse Rate:  [48-90] 71 (01/10 0744) Resp:  [10-18] 18 (01/10 0744) BP: (81-144)/(45-79) 114/73 (01/10 0744) SpO2:  [91 %-99 %] 97 % (01/10 0744) Weight:  [107.5 kg] 107.5 kg (01/09 1115)  Intake/Output from previous day: 01/09 0701 - 01/10 0700 In: 1386.4 [I.V.:1000; IV Piggyback:386.4] Out: 1325 [Urine:1275; Blood:50] Intake/Output this shift: Total I/O In: -  Out: 400 [Urine:400]  Recent Labs    05/23/23 0526  HGB 12.0*   Recent Labs    05/23/23 0526  WBC 15.3*  RBC 3.72*  HCT 33.3*  PLT 241   Recent Labs    05/23/23 0526  NA 134*  K 3.1*  CL 101  CO2 21*  BUN 31*  CREATININE 1.68*  GLUCOSE 176*  CALCIUM  9.1   Recent Labs    05/22/23 0639  INR 1.2    EXAM General - Patient is Alert, Appropriate, and Oriented Extremity - Neurovascular intact Sensation intact distally Intact pulses distally Dorsiflexion/Plantar flexion intact No cellulitis present Compartment soft Dressing - dressing C/D/I and no drainage Motor Function - intact, moving foot and toes well on exam.   Past Medical History:  Diagnosis Date   A-fib (HCC)    a.) CHA2DS2VASc = 5 (age, HTN, CVA x 2, T2DM);  b.) rate/rhythm maintained on oral beta blocker (atenolol ); chronically anticoagulated with apixaban    Acute ischemic right middle cerebral artery (MCA) stroke (HCC) 07/18/2021   a.) CT head and MRI brain 07/18/2021: acute or subacute RIGHT anterior MCA territory infarct. 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 shoulder latera. Superficial and nodular.    Basal cell carcinoma 05/25/2020   L upper back lateral - ED&C   Basal cell carcinoma 05/25/2020   L shoulder posterior adjacent to white patch - ED&C   Basal cell carcinoma 05/25/2020   L upper medial back - ED&C   Basal cell carcinoma 04/10/2022   Left upper back. Superficial and nodular. EDC done 06/19/22   Basal cell carcinoma 04/10/2022   Mid back. Superficial and nodular. EDC done 06/19/22   Bradycardia    CKD (chronic kidney disease), stage III (HCC)    Dental bridge present    permanent, bottom   Diastolic dysfunction    a.) TTE 07/19/2021: EF 50-55%, no RWMAs, G2DD   Diverticulosis    Dysrhythmia    Erectile dysfunction    a.) on PDE5i (sildenafil)   GERD (gastroesophageal reflux disease)    Headache    Hemorrhoids    History of bilateral cataract extraction 03/2022   Hyperlipidemia    Hypertension    Long term current use of anticoagulant    a.) apixaban   Sleep apnea    a.) not using nocturnal PAP therapy   Squamous cell carcinoma of skin 04/27/2020   R temple - MOHS 06/16/20 at [facility]   T2DM (type 2 diabetes mellitus) (HCC)     Assessment/Plan:   1 Day Post-Op Procedure(s) (LRB): TOTAL KNEE ARTHROPLASTY (Right) Principal Problem:   S/P TKR (total knee replacement) using cement, right  Estimated body mass index is 35 kg/m as calculated from the following:   Height as of this encounter: 5' 9 (1.753 m).   Weight as of this encounter: 107.5 kg. Advance diet Up with therapy Pain well controlled VSS Hypokalemia - K 3.1, will give oral K supplement today CM to assist with discharge to home  with HHPT today pending safe completion of PT goals  DVT Prophylaxis - TED hose and SCDs Eliquis  Weight-Bearing as tolerated to right leg   T. Medford Amber, PA-C [facility] 05/23/2023, 7:48 AM

## 2023-05-23 NOTE — Anesthesia Postprocedure Evaluation (Signed)
 Anesthesia Post Note  Patient: George Schmidt  Procedure(s) Performed: TOTAL KNEE ARTHROPLASTY (Right: Knee)  Patient location during evaluation: Nursing Unit Anesthesia Type: Spinal Level of consciousness: awake, awake and alert and oriented Pain management: pain level controlled Vital Signs Assessment: post-procedure vital signs reviewed and stable Respiratory status: respiratory function stable Cardiovascular status: stable Postop Assessment: no headache, no backache, patient able to bend at knees, no apparent nausea or vomiting, able to ambulate and adequate PO intake Anesthetic complications: no   No notable events documented.   Last Vitals:  Vitals:   05/23/23 0428 05/23/23 0744  BP: 139/75 114/73  Pulse: 76 71  Resp: 16 18  Temp: 36.9 C 37 C  SpO2: 98% 97%    Last Pain:  Vitals:   05/23/23 0744  TempSrc: Oral  PainSc:                  Lorriane Romero FALCON

## 2023-05-23 NOTE — Discharge Summary (Signed)
 Physician Discharge Summary  Patient ID: George Schmidt MRN: [id number] DOB/AGE: [birth year] 73 y.o.  Admit date: 05/22/2023 Discharge date: 05/23/2023  Admission Diagnoses:  S/P TKR (total knee replacement) using cement, right [Z96.651]   Discharge Diagnoses: Patient Active Problem List   Diagnosis Date Noted   S/P TKR (total knee replacement) using cement, right 05/22/2023   S/P TKR (total knee replacement) using cement, left 09/26/2022   History of COVID-19 07/19/2021   CVA (cerebral vascular accident) (HCC) 07/19/2021   Acute right MCA stroke (HCC) 07/18/2021   Stage 3b chronic kidney disease (CKD) (HCC) 07/18/2021   Cerebellar stroke, acute (HCC) 11/30/2019   Cerebellar stroke (HCC) 11/30/2019   Hypertension    Diabetes mellitus without complication (HCC)    Hyperlipidemia    Elevated troponin    Vertigo    Hypokalemia    Elevated troponin I level     Past Medical History:  Diagnosis Date   A-fib (HCC)    a.) CHA2DS2VASc = 5 (age, HTN, CVA x 2, T2DM);  b.) rate/rhythm maintained on oral beta blocker (atenolol ); chronically anticoagulated with apixaban    Acute ischemic right middle cerebral artery (MCA) stroke (HCC) 07/18/2021   a.) CT head and MRI brain 07/18/2021: acute or subacute RIGHT anterior MCA territory infarct. 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 shoulder latera. Superficial and nodular.    Basal cell carcinoma 05/25/2020   L upper back lateral - ED&C   Basal cell carcinoma 05/25/2020   L shoulder posterior adjacent to white patch - ED&C   Basal cell carcinoma  05/25/2020   L upper medial back - ED&C   Basal cell carcinoma 04/10/2022   Left upper back. Superficial and nodular. EDC done 06/19/22   Basal cell carcinoma 04/10/2022   Mid back. Superficial and nodular. EDC done 06/19/22   Bradycardia    CKD (chronic kidney disease), stage III (HCC)    Dental bridge present    permanent, bottom   Diastolic dysfunction    a.) TTE 07/19/2021: EF 50-55%, no RWMAs, G2DD   Diverticulosis    Dysrhythmia    Erectile dysfunction    a.) on PDE5i (sildenafil)   GERD (gastroesophageal reflux disease)    Headache    Hemorrhoids    History of bilateral cataract extraction 03/2022   Hyperlipidemia    Hypertension    Long term current use of anticoagulant    a.) apixaban    Sleep apnea    a.) not using nocturnal PAP therapy   Squamous cell carcinoma of skin 04/27/2020   R temple - MOHS 06/16/20 at [facility]   T2DM (type 2 diabetes mellitus) (HCC)      Transfusion: none   Consultants (if any):   Discharged Condition: Improved  Hospital Course: George Schmidt is an 73 y.o. male who was admitted 05/22/2023 with a diagnosis of S/P TKR (total knee replacement) using cement, right and went to the operating room on 05/22/2023 and underwent the above named procedures.    Surgeries: Procedure(s): TOTAL KNEE ARTHROPLASTY on 05/22/2023 Patient tolerated the surgery well.  Taken to PACU where she was stabilized and then transferred to the orthopedic floor.  Started on Eliquis  TEDs and SCDs applied bilaterally. Heels elevated on bed. No evidence of DVT. Negative Homan. Physical therapy started on day #1 for gait training and transfer. OT started day #1 for ADL and assisted devices.  POD1 labs were stable. Mild hypokalemia noted, K 3.1. Patient given oral K supplement in hospital with prescription for short course of oral K. Patient's IV was d/c on day #1. Patient was able to safely and independently complete all PT goals. PT recommending discharge to home.     On post op day #1 patient was stable and ready for discharge to home with HHPT.  Implants: Femur: Persona Size 10 CR   Tibia: Persona Size G w/ 14x29mm stem extension  Poly: 11mm MC  Patella: 35x65mm symmetric   He was given perioperative antibiotics:  Anti-infectives (From admission, onward)    Start     Dose/Rate Route Frequency Ordered Stop   05/22/23 1345  ceFAZolin  (ANCEF ) IVPB 2g/100 mL premix        2 g 200 mL/hr over 30 Minutes Intravenous Every 6 hours 05/22/23 1328 05/22/23 1905   05/22/23 0615  ceFAZolin  (ANCEF ) IVPB 2g/100 mL premix        2 g 200 mL/hr over 30 Minutes Intravenous On call to O.R. 05/22/23 [id number] 05/22/23 0740     .  He was given sequential compression devices, early ambulation, and Eliquis  for DVT prophylaxis.  He benefited maximally from the hospital stay and there were no complications.    Recent vital signs:  Vitals:   05/23/23 0428 05/23/23 0744  BP: 139/75 114/73  Pulse: 76 71  Resp: 16 18  Temp: 98.5 F (36.9 C) 98.6 F (37 C)  SpO2: 98% 97%    Recent laboratory studies:  Lab Results  Component Value Date   HGB 12.0 (L) 05/23/2023   HGB 13.8 05/13/2023   HGB 12.4 (L) 09/27/2022   Lab Results  Component Value Date   WBC 15.3 (H) 05/23/2023   PLT 241 05/23/2023   Lab Results  Component Value Date   INR 1.2 05/22/2023   Lab Results  Component Value Date   NA 134 (L) 05/23/2023   K 3.1 (L) 05/23/2023   CL 101 05/23/2023   CO2 21 (L) 05/23/2023   BUN 31 (H) 05/23/2023   CREATININE 1.68 (H) 05/23/2023   GLUCOSE 176 (H) 05/23/2023    Discharge Medications:   Allergies as of 05/23/2023       Reactions   Amoxicillin Hives   Clarithromycin Hives   Propranolol Rash        Medication List     STOP taking these medications    warfarin 3 MG tablet Commonly known as: COUMADIN       TAKE these medications    acetaminophen  500 MG tablet Commonly known as: TYLENOL  Take 2 tablets (1,000 mg total) by mouth every 8  (eight) hours.   amLODipine  10 MG tablet Commonly known as: NORVASC  Take 10 mg by mouth daily.   apixaban  5 MG Tabs tablet Commonly known as: ELIQUIS  Take 1 tablet (5 mg total) by mouth 2 (two) times daily.   atenolol  25 MG tablet Commonly known as: TENORMIN  Take 25 mg by mouth daily.   atorvastatin  40 MG tablet Commonly known as: Lipitor Take 1 tablet (40 mg total) by mouth daily.   calcium  carbonate 500 MG chewable tablet Commonly known as: TUMS - dosed  in mg elemental calcium  Chew 1 tablet by mouth as needed for indigestion or heartburn. 2 tablets   chlorthalidone  25 MG tablet Commonly known as: HYGROTON  Take 25 mg by mouth daily.   docusate sodium  100 MG capsule Commonly known as: COLACE Take 1 capsule (100 mg total) by mouth 2 (two) times daily.   ezetimibe  10 MG tablet Commonly known as: ZETIA  Take 10 mg by mouth daily.   fenofibrate  160 MG tablet Take 160 mg by mouth daily.   metFORMIN  500 MG 24 hr tablet Commonly known as: GLUCOPHAGE -XR 500 mg in the morning and at bedtime. Take 2 tablets by mouth once daily   ondansetron  4 MG tablet Commonly known as: ZOFRAN  Take 1 tablet (4 mg total) by mouth every 6 (six) hours as needed for nausea.   oxyCODONE  5 MG immediate release tablet Commonly known as: Oxy IR/ROXICODONE  Take 0.5 tablets (2.5 mg total) by mouth every 6 (six) hours as needed for severe pain (pain score 7-10) or breakthrough pain. What changed:  how much to take reasons to take this   potassium chloride  SA 20 MEQ tablet Commonly known as: KLOR-CON  M Take 1 tablet (20 mEq total) by mouth daily for 3 days. What changed: when to take this   PRESERVISION AREDS PO Take by mouth 2 (two) times daily.   sildenafil 100 MG tablet Commonly known as: VIAGRA Take by mouth.   traMADol  50 MG tablet Commonly known as: ULTRAM  Take 1 tablet (50 mg total) by mouth every 6 (six) hours as needed for moderate pain (pain score 4-6).        Diagnostic  Studies: DG Knee Right Port Result Date: 05/22/2023 CLINICAL DATA:  S/P TKR (total knee replacement) using cement, right [id number] EXAM: PORTABLE RIGHT KNEE - 1-2 VIEW COMPARISON:  None Available. FINDINGS: Two views of the right knee demonstrate a total knee arthroplasty. The knee is located without a periprosthetic fracture. Normal alignment. Soft tissue lucency in the anterior knee compatible with postoperative changes. Large calcification in the medial posterior knee soft tissues. IMPRESSION: Right total knee arthroplasty without complicating features. Electronically Signed   By: Juliene Balder M.D.   On: 05/22/2023 11:33    Disposition:      Follow-up Information     Charlene Debby BROCKS, PA-C Follow up in 2 week(s).   Specialties: Orthopedic Surgery, Emergency Medicine Contact information: [address] [place] [address] [phone]                  Signed: CHARLENE DEBBY BRUCKNER 05/23/2023, 7:54 AM

## 2023-05-23 NOTE — Evaluation (Signed)
 Occupational Therapy Evaluation Patient Details Name: George Schmidt MRN: [id number] DOB: Oct 04, 1950 Today's Date: 05/23/2023   History of Present Illness George Schmidt is a 72yoM who presents for Rt TKA. PMH: left TKA 2023, OSA, HTN, AF, CVA, DM.   Clinical Impression   Pt seen for OT evaluation this date, POD#1 from above surgery. Pt was independent in all aspects prior to surgery and is eager to return to PLOF with less pain and improved safety and independence. Pt currently requires PRN minimal assist for LB ADL while in seated position due to pain and limited AROM of R knee. Pt instructed in polar care mgt, falls prevention strategies, home/routines modifications, DME/AE for LB bathing and dressing tasks, and compression stocking mgt. Handout provided to support recall and carryover. Pt reports wife able to assist as needed at discharge. Verbalized understanding of all instruction provided. Supv for all mobility with RW during session. All training/education completed, no additional skilled OT services needed at this time. Will sign off.        If plan is discharge home, recommend the following: A little help with bathing/dressing/bathroom;Assistance with cooking/housework;Assist for transportation;Help with stairs or ramp for entrance    Functional Status Assessment  Patient has had a recent decline in their functional status and demonstrates the ability to make significant improvements in function in a reasonable and predictable amount of time.  Equipment Recommendations  None recommended by OT    Recommendations for Other Services       Precautions / Restrictions Precautions Precautions: Fall;Knee Restrictions Weight Bearing Restrictions Per Provider Order: Yes RLE Weight Bearing Per Provider Order: Weight bearing as tolerated      Mobility Bed Mobility Overal bed mobility: Modified Independent                  Transfers Overall transfer level: Needs  assistance Equipment used: Rolling walker (2 wheels) Transfers: Sit to/from Stand Sit to Stand: Supervision           General transfer comment: from [facility] over toilet      Balance Overall balance assessment: Modified Independent, History of Falls                                         ADL either performed or assessed with clinical judgement   ADL Overall ADL's : Needs assistance/impaired                                       General ADL Comments: Pt currently requires PRN MIN A for LB ADL and MAX A for compression stocking mgt. Wife able to provide level of assist per pt.     Vision         Perception         Praxis         Pertinent Vitals/Pain Pain Assessment Pain Assessment: 0-10 Pain Score: 9  Pain Location: Rt knee; facial expressions indicating likely more of a 2-4/10 Pain Descriptors / Indicators: Aching, Operative site guarding Pain Intervention(s): Limited activity within patient's tolerance, Monitored during session, Premedicated before session, Repositioned, Ice applied     Extremity/Trunk Assessment Upper Extremity Assessment Upper Extremity Assessment: Overall WFL for tasks assessed   Lower Extremity Assessment Lower Extremity Assessment: RLE deficits/detail RLE Deficits / Details: s/p TKA  Communication Communication Communication: No apparent difficulties   Cognition Arousal: Alert Behavior During Therapy: WFL for tasks assessed/performed Overall Cognitive Status: Within Functional Limits for tasks assessed                                       General Comments       Exercises Other Exercises Other Exercises: Pt educated in home/routines modifications, falls prevention, compression stocking mgt, polar care mgt, and AE/DME   Shoulder Instructions      Home Living Family/patient expects to be discharged to:: Private residence Living Arrangements: Spouse/significant  other Available Help at Discharge: Family Type of Home: Mobile home Home Access: Ramped entrance     Home Layout: One level     Bathroom Shower/Tub: Arts Development Officer Toilet: Handicapped height     Home Equipment: Agricultural Consultant (2 wheels);BSC/3in1;Grab bars - tub/shower;Shower seat          Prior Functioning/Environment Prior Level of Function : Independent/Modified Independent                        OT Problem List: Decreased strength;Pain;Decreased range of motion      OT Treatment/Interventions:      OT Goals(Current goals can be found in the care plan section) Acute Rehab OT Goals Patient Stated Goal: go home asap OT Goal Formulation: All assessment and education complete, DC therapy  OT Frequency:      Co-evaluation              AM-PAC OT 6 Clicks Daily Activity     Outcome Measure Help from another person eating meals?: None Help from another person taking care of personal grooming?: None Help from another person toileting, which includes using toliet, bedpan, or urinal?: None Help from another person bathing (including washing, rinsing, drying)?: A Little Help from another person to put on and taking off regular upper body clothing?: None Help from another person to put on and taking off regular lower body clothing?: A Little 6 Click Score: 22   End of Session Equipment Utilized During Treatment: Rolling walker (2 wheels) Nurse Communication: Mobility status  Activity Tolerance: Patient tolerated treatment well Patient left: in chair;with call bell/phone within reach;Other (comment) (rolled towel under R ankle, polar care in place)  OT Visit Diagnosis: Other abnormalities of gait and mobility (R26.89);Pain Pain - Right/Left: Right Pain - part of body: Knee                Time: 9143-9091 OT Time Calculation (min): 12 min Charges:  OT General Charges $OT Visit: 1 Visit OT Evaluation $OT Eval Low Complexity: 1 Low OT  Treatments $Self Care/Home Management : 8-22 mins  Warren SAUNDERS., MPH, MS, OTR/L ascom [phone] 05/23/23, 9:46 AM

## 2023-05-23 NOTE — Progress Notes (Signed)
 Physical Therapy Treatment Patient Details Name: George Schmidt MRN: [id number] DOB: [birth year] Today's Date: 05/23/2023   History of Present Illness George Schmidt is a 72yoM who presents for Rt TKA. PMH: left TKA 2023, OSA, HTN, AF, CVA, DM.    PT Comments  Pt in recliner on entry, has just completed OT visit. Pt agreeable to PT session, was premedicated, has eaten breakfast, reports a rough night with the bone foam. Pt asks about side sleeping when he gets home- we discussed this previous day. Again, dino advised against this as it would not allow for keeping knee in extension at night as directed; also anticipate this to be quite painful early on. Pt able to demonstrate improved independence and safety with transfers and AMB of household distances. Reviewed education regarding HEP, polar care, knee precautions, and safe RW use. Pt reports readiness and confidence in DC at this time. No additional PT services needed.     If plan is discharge home, recommend the following: A lot of help with walking and/or transfers;Assist for transportation;Assistance with cooking/housework;Help with stairs or ramp for entrance   Can travel by private vehicle        Equipment Recommendations  None recommended by PT    Recommendations for Other Services       Precautions / Restrictions Precautions Precautions: Fall;Knee Precaution Booklet Issued: Yes (comment) Restrictions RLE Weight Bearing Per Provider Order: Weight bearing as tolerated     Mobility  Bed Mobility                    Transfers Overall transfer level: Modified independent Equipment used: Rolling walker (2 wheels) Transfers: Sit to/from Stand Sit to Stand: Modified independent (Device/Increase time)                Ambulation/Gait Ambulation/Gait assistance: Contact guard assist Gait Distance (Feet): 200 Feet Assistive device: Rolling walker (2 wheels) Gait Pattern/deviations: Step-through  pattern Gait velocity: 0.32m/s     General Gait Details: no dizziness issues with upright mobiilty today   Stairs Stairs:  (pt declines; has a ramp)           Wheelchair Mobility     Tilt Bed    Modified Rankin (Stroke Patients Only)       Balance Overall balance assessment: Modified Independent, History of Falls                                          Cognition Arousal: Alert Behavior During Therapy: WFL for tasks assessed/performed Overall Cognitive Status: Within Functional Limits for tasks assessed                                          Exercises Total Joint Exercises Ankle Circles/Pumps: AROM, Left, Supine Short Arc Quad: AROM, Right, Supine Hip ABduction/ADduction: AROM, Right, Supine Straight Leg Raises: AROM, Supine, Right Goniometric ROM: Rt knee ROM: 15-90 degrees    General Comments        Pertinent Vitals/Pain Pain Assessment Pain Assessment: 0-10 Pain Score: 9  Pain Descriptors / Indicators: Aching, Operative site guarding Pain Intervention(s): Limited activity within patient's tolerance, Monitored during session, Premedicated before session    Home Living  Prior Function            PT Goals (current goals can now be found in the care plan section) Acute Rehab PT Goals Patient Stated Goal: DC 1/10 early prior to inclement weather PT Goal Formulation: With patient Time For Goal Achievement: 06/05/23 Potential to Achieve Goals: Good Progress towards PT goals: Progressing toward goals    Frequency    BID      PT Plan      Co-evaluation              AM-PAC PT 6 Clicks Mobility   Outcome Measure  Help needed turning from your back to your side while in a flat bed without using bedrails?: A Little Help needed moving from lying on your back to sitting on the side of a flat bed without using bedrails?: A Little Help needed moving to and from a bed  to a chair (including a wheelchair)?: A Little Help needed standing up from a chair using your arms (e.g., wheelchair or bedside chair)?: A Little Help needed to walk in hospital room?: A Little Help needed climbing 3-5 steps with a railing? : A Lot 6 Click Score: 17    End of Session Equipment Utilized During Treatment: Gait belt Activity Tolerance: Patient tolerated treatment well;No increased pain Patient left: in chair;with call bell/phone within reach Nurse Communication: Mobility status PT Visit Diagnosis: Difficulty in walking, not elsewhere classified (R26.2);Other abnormalities of gait and mobility (R26.89)     Time: 9096-9076 PT Time Calculation (min) (ACUTE ONLY): 20 min  Charges:    $Therapeutic Activity: 8-22 mins PT General Charges $$ ACUTE PT VISIT: 1 Visit                    4:59 PM, 05/23/23 Peggye JAYSON Linear, PT, DPT Physical Therapist - [facility]  [phone] (ASCOM)     Kasondra Junod C 05/23/2023, 4:55 PM

## 2023-05-24 ENCOUNTER — Encounter: Payer: Self-pay | Admitting: Orthopedic Surgery

## 2023-08-25 ENCOUNTER — Other Ambulatory Visit: Payer: Self-pay | Admitting: Orthopedic Surgery

## 2023-08-25 DIAGNOSIS — M5412 Radiculopathy, cervical region: Secondary | ICD-10-CM

## 2023-08-25 DIAGNOSIS — M25511 Pain in right shoulder: Secondary | ICD-10-CM

## 2023-08-25 DIAGNOSIS — M75101 Unspecified rotator cuff tear or rupture of right shoulder, not specified as traumatic: Secondary | ICD-10-CM

## 2023-08-25 DIAGNOSIS — M47812 Spondylosis without myelopathy or radiculopathy, cervical region: Secondary | ICD-10-CM

## 2023-08-28 ENCOUNTER — Ambulatory Visit

## 2023-08-29 ENCOUNTER — Ambulatory Visit
Admission: RE | Admit: 2023-08-29 | Discharge: 2023-08-29 | Disposition: A | Discharge status: Home / Self Care | Source: Ambulatory Visit | Attending: Orthopedic Surgery | Admitting: Orthopedic Surgery

## 2023-08-29 DIAGNOSIS — M5412 Radiculopathy, cervical region: Secondary | ICD-10-CM | POA: Diagnosis present

## 2023-08-29 DIAGNOSIS — M25511 Pain in right shoulder: Secondary | ICD-10-CM

## 2023-08-29 DIAGNOSIS — M47812 Spondylosis without myelopathy or radiculopathy, cervical region: Secondary | ICD-10-CM

## 2023-08-29 DIAGNOSIS — M75101 Unspecified rotator cuff tear or rupture of right shoulder, not specified as traumatic: Secondary | ICD-10-CM

## 2024-04-01 ENCOUNTER — Other Ambulatory Visit: Payer: Self-pay | Admitting: Otolaryngology

## 2024-04-01 DIAGNOSIS — R1310 Dysphagia, unspecified: Secondary | ICD-10-CM

## 2024-04-01 DIAGNOSIS — K219 Gastro-esophageal reflux disease without esophagitis: Secondary | ICD-10-CM

## 2024-04-06 ENCOUNTER — Ambulatory Visit
Admission: RE | Admit: 2024-04-06 | Discharge: 2024-04-06 | Disposition: A | Discharge status: Home / Self Care | Source: Ambulatory Visit | Attending: Otolaryngology | Admitting: Otolaryngology

## 2024-04-06 DIAGNOSIS — K219 Gastro-esophageal reflux disease without esophagitis: Secondary | ICD-10-CM | POA: Diagnosis present

## 2024-04-06 DIAGNOSIS — R1310 Dysphagia, unspecified: Secondary | ICD-10-CM | POA: Insufficient documentation

## 2024-04-06 NOTE — Progress Notes (Signed)
 Modified Barium Swallow Study  Patient Details  Name: George Schmidt MRN: [id number] Date of Birth: [birth year]  Today's Date: 04/06/2024  Modified Barium Swallow completed.  Full report located under Chart Review in the Imaging Section.  History of Present Illness Per pt report and chart notes, pt is a 73yo male w/ PMH including ringing in his ears, sinus drainage and Phlegm, GERD - recently placed on PPI but also takes OTC medication, Arthritis, Diabetes mellitus type 2, uncomplicated, obesity, Diverticulosis, skin cancer, Stroke 12/01/2019, Hyperlipidemia, Hypertension, persistent atrial fibrillation, Stage 3b chronic kidney disease.  Pt endorses No weight loss nor change in his diet; No dx nor tx of pneumonia.      Pt endorsed ongoing drainage and Phlegm(coughs up in the mornings) as well as described s/s of GERD(burning sensation, globus, phlegm, belching).  He has not seen a GI at this time.  Pt does Not drink w/ a straw at Baseline.   Clinical Impression Patient presents with functional oropharyngeal phase swallowing for age, and in setting of Baseline h/o GERD. ANY such Esophageal phase deficit(GERD) can impact the oropharyngeal phase of swallowing, especially the sensation and timing of the pharyngeal swallow. No laryngeal penetration nor aspiration occurred during this study.   Oral phase is c/b adequate lip closure, bolus preparation and containment, mastication, and anterior to posterior transit. Swallow initiation occurs primarily at the level of the posterior laryngeal surface of the epiglottis w/ liquids; at the Valleculae w/ trials of increased texture(puree/honey/solid).  Pharyngeal phase is noted for adequate retraction, adequate hyolaryngeal excursion, and adequate pharyngeal constriction. Pharyngeal stripping wave is complete. Epiglottic inversion is timely/complete during the swallow w/ No laryngeal penetration nor aspiration noted during any trial of this study.  No pharyngeal residue noted.  Amplitude/duration of cricopharyngeus opening appeared [facility]. There was adequate/complete clearance through the upper cervical Esophagus. A brief Esophageal sweep was performed in the upright, lateral position post solid trial w/ no obvious bolus residue/stasis viewable/noted. A 13 mm barium tablet was not given as pt denied difficulty swallowing medications at home. Of note, the cricopharyngeus muscle appeared slightly prominent during the swallow. Pt does have h/o GERD.    Pt was educated on the results of the study immediately after. Video viewed together and questions answered. Factors that may increase risk of adverse event in presence of aspiration Noe & Lianne 2021): GI disease   Swallow Evaluation Recommendations Recommendations: PO diet PO Diet Recommendation: Regular; Thin liquids (Level 0) - (moisteened foods; small bites. Avoid Problematic foods such as Nuts and tough Meats.) Liquid Administration via: Cup; No straw (baseline for pt per his report) Medication Administration: Whole meds with liquid Supervision: Patient able to self-feed Swallowing strategies: Minimize environmental distractions/talking during eating/drinking; Slow rate; Small bites/sips; Follow solids with liquids Postural changes: Position pt fully upright for meals; Stay upright 30-60 min after meals; Out of bed for meals (GERD precautions baseline) Oral care recommendations: Oral care BID (2x/day); Pt independent with oral care Recommended consults: Consider GI consultation for GERD management/tx        Comer Portugal, MS, CCC-SLP Speech Language Pathologist Rehab Services; [facility] [place] [phone] (ascom) Virdia Ziesmer 04/06/2024,5:14 PM
